# Patient Record
Sex: Female | Born: 1935 | Race: Black or African American | Hispanic: No | Smoking: Former smoker
Health system: Southern US, Community
[De-identification: ages and names within clinical notes are randomized; demographics above are authoritative.]

## PROBLEM LIST (undated history)

## (undated) DIAGNOSIS — I479 Paroxysmal tachycardia, unspecified: Secondary | ICD-10-CM

## (undated) DIAGNOSIS — I10 Essential (primary) hypertension: Secondary | ICD-10-CM

## (undated) DIAGNOSIS — E0590 Thyrotoxicosis, unspecified without thyrotoxic crisis or storm: Secondary | ICD-10-CM

## (undated) DIAGNOSIS — L84 Corns and callosities: Secondary | ICD-10-CM

## (undated) DIAGNOSIS — Z5111 Encounter for antineoplastic chemotherapy: Secondary | ICD-10-CM

## (undated) HISTORY — DX: Essential (primary) hypertension: I10

## (undated) HISTORY — DX: Thyrotoxicosis, unspecified without thyrotoxic crisis or storm: E05.90

## (undated) HISTORY — DX: Corns and callosities: L84

## (undated) HISTORY — PX: VAGINAL HYSTERECTOMY: SUR661

## (undated) HISTORY — DX: Encounter for antineoplastic chemotherapy: Z51.11

---

## 1987-11-12 HISTORY — PX: LESION REMOVAL: SHX5196

## 1997-08-29 ENCOUNTER — Ambulatory Visit (HOSPITAL_COMMUNITY): Admission: RE | Admit: 1997-08-29 | Discharge: 1997-08-29 | Payer: Self-pay | Admitting: Obstetrics & Gynecology

## 1998-09-02 ENCOUNTER — Ambulatory Visit (HOSPITAL_COMMUNITY): Admission: RE | Admit: 1998-09-02 | Discharge: 1998-09-02 | Payer: Self-pay | Admitting: Obstetrics & Gynecology

## 1998-09-24 ENCOUNTER — Other Ambulatory Visit: Admission: RE | Admit: 1998-09-24 | Discharge: 1998-09-24 | Payer: Self-pay | Admitting: Obstetrics & Gynecology

## 1999-09-04 ENCOUNTER — Ambulatory Visit (HOSPITAL_COMMUNITY): Admission: RE | Admit: 1999-09-04 | Discharge: 1999-09-04 | Payer: Self-pay | Admitting: Obstetrics & Gynecology

## 1999-09-04 ENCOUNTER — Encounter: Payer: Self-pay | Admitting: Obstetrics & Gynecology

## 1999-09-16 ENCOUNTER — Other Ambulatory Visit: Admission: RE | Admit: 1999-09-16 | Discharge: 1999-09-16 | Payer: Self-pay | Admitting: Obstetrics & Gynecology

## 2000-09-10 ENCOUNTER — Encounter: Payer: Self-pay | Admitting: Obstetrics & Gynecology

## 2000-09-10 ENCOUNTER — Ambulatory Visit (HOSPITAL_COMMUNITY): Admission: RE | Admit: 2000-09-10 | Discharge: 2000-09-10 | Payer: Self-pay | Admitting: Obstetrics & Gynecology

## 2000-10-08 ENCOUNTER — Other Ambulatory Visit: Admission: RE | Admit: 2000-10-08 | Discharge: 2000-10-08 | Payer: Self-pay | Admitting: Obstetrics & Gynecology

## 2001-09-12 ENCOUNTER — Ambulatory Visit (HOSPITAL_COMMUNITY): Admission: RE | Admit: 2001-09-12 | Discharge: 2001-09-12 | Payer: Self-pay | Admitting: Obstetrics & Gynecology

## 2001-09-12 ENCOUNTER — Encounter: Payer: Self-pay | Admitting: Obstetrics & Gynecology

## 2001-11-15 ENCOUNTER — Other Ambulatory Visit: Admission: RE | Admit: 2001-11-15 | Discharge: 2001-11-15 | Payer: Self-pay | Admitting: Obstetrics & Gynecology

## 2002-09-14 ENCOUNTER — Ambulatory Visit (HOSPITAL_COMMUNITY): Admission: RE | Admit: 2002-09-14 | Discharge: 2002-09-14 | Payer: Self-pay | Admitting: Obstetrics & Gynecology

## 2002-09-14 ENCOUNTER — Encounter: Payer: Self-pay | Admitting: Obstetrics & Gynecology

## 2003-09-20 ENCOUNTER — Ambulatory Visit (HOSPITAL_COMMUNITY): Admission: RE | Admit: 2003-09-20 | Discharge: 2003-09-20 | Payer: Self-pay | Admitting: Obstetrics & Gynecology

## 2003-12-17 ENCOUNTER — Other Ambulatory Visit: Admission: RE | Admit: 2003-12-17 | Discharge: 2003-12-17 | Payer: Self-pay | Admitting: Obstetrics & Gynecology

## 2003-12-20 ENCOUNTER — Encounter: Admission: RE | Admit: 2003-12-20 | Discharge: 2003-12-20 | Payer: Self-pay | Admitting: Obstetrics & Gynecology

## 2004-10-09 ENCOUNTER — Ambulatory Visit (HOSPITAL_COMMUNITY): Admission: RE | Admit: 2004-10-09 | Discharge: 2004-10-09 | Payer: Self-pay | Admitting: Obstetrics & Gynecology

## 2005-03-03 ENCOUNTER — Encounter: Admission: RE | Admit: 2005-03-03 | Discharge: 2005-03-03 | Payer: Self-pay | Admitting: Obstetrics & Gynecology

## 2006-01-28 ENCOUNTER — Encounter: Admission: RE | Admit: 2006-01-28 | Discharge: 2006-01-28 | Payer: Self-pay | Admitting: Family Medicine

## 2011-04-24 ENCOUNTER — Other Ambulatory Visit: Payer: Self-pay | Admitting: Internal Medicine

## 2011-04-24 DIAGNOSIS — R946 Abnormal results of thyroid function studies: Secondary | ICD-10-CM

## 2011-05-18 ENCOUNTER — Encounter (HOSPITAL_COMMUNITY)
Admission: RE | Admit: 2011-05-18 | Discharge: 2011-05-18 | Disposition: A | Discharge status: Home / Self Care | Payer: Medicare Other | Source: Ambulatory Visit | Attending: Internal Medicine | Admitting: Internal Medicine

## 2011-05-18 DIAGNOSIS — R946 Abnormal results of thyroid function studies: Secondary | ICD-10-CM

## 2011-05-18 DIAGNOSIS — E041 Nontoxic single thyroid nodule: Secondary | ICD-10-CM | POA: Insufficient documentation

## 2011-05-19 ENCOUNTER — Encounter (HOSPITAL_COMMUNITY)
Admission: RE | Admit: 2011-05-19 | Discharge: 2011-05-19 | Disposition: A | Discharge status: Home / Self Care | Payer: Medicare Other | Source: Ambulatory Visit | Attending: Internal Medicine | Admitting: Internal Medicine

## 2011-05-19 MED ORDER — SODIUM PERTECHNETATE TC 99M INJECTION
10.0000 | Freq: Once | INTRAVENOUS | Status: AC | PRN
  Administered 2011-05-19: 10 via INTRAVENOUS

## 2011-05-19 MED ORDER — SODIUM IODIDE I 131 CAPSULE
9.8000 | Freq: Once | INTRAVENOUS | Status: AC | PRN
  Administered 2011-05-18: 9.8 via ORAL

## 2012-09-01 ENCOUNTER — Encounter: Payer: Self-pay | Admitting: Podiatry

## 2012-09-01 ENCOUNTER — Encounter: Payer: Self-pay | Admitting: *Deleted

## 2012-09-06 ENCOUNTER — Ambulatory Visit (INDEPENDENT_AMBULATORY_CARE_PROVIDER_SITE_OTHER): Payer: Medicare Other | Admitting: Podiatry

## 2012-09-06 ENCOUNTER — Encounter: Payer: Self-pay | Admitting: Podiatry

## 2012-09-06 VITALS — BP 172/105 | HR 109 | Ht 64.0 in | Wt 135.0 lb

## 2012-09-06 DIAGNOSIS — M25579 Pain in unspecified ankle and joints of unspecified foot: Secondary | ICD-10-CM

## 2012-09-06 DIAGNOSIS — Q828 Other specified congenital malformations of skin: Secondary | ICD-10-CM

## 2012-09-06 DIAGNOSIS — B351 Tinea unguium: Secondary | ICD-10-CM

## 2012-09-06 NOTE — Patient Instructions (Addendum)
Seen for painful corns and calluses. All nails, corns, and calluses trimmed. Return in one month or as needed.

## 2012-09-07 NOTE — Progress Notes (Signed)
Subjective: 78 y.o. year old female patient presents complaining of painful corns, calluses and toe nails. Patient requests toe nails, corns and calluses trimmed.   Review of Systems - General ROS: negative for - chills, fatigue, fever, hot flashes, malaise, night sweats, sleep disturbance, weight gain or weight loss.  Objective: Dermatologic: Thick yellow deformed nails 1-5 bilateral.  Positive of painful calluses under the ball of both feet. No open skin lesions.   Vascular: Pedal pulses are all palpable.  Orthopedic: Contracted lesser digits with digital corns 4th bilateral.  Neurologic: All epicritic and tactile sensations grossly intact.  Assessment: 1. Mycotic nails x 10. 2. Intractable porokeratosis plantar bilateral. 3. Hammer toe deformity bilateral.  Treatment: All mycotic nails, corns, calluses debrided.  Padded 4th digit left foot. Return as needed.

## 2012-10-04 ENCOUNTER — Ambulatory Visit (INDEPENDENT_AMBULATORY_CARE_PROVIDER_SITE_OTHER): Payer: Medicare Other | Admitting: Podiatry

## 2012-10-04 ENCOUNTER — Encounter: Payer: Self-pay | Admitting: Podiatry

## 2012-10-04 VITALS — BP 142/89 | HR 103 | Ht 64.0 in | Wt 135.0 lb

## 2012-10-04 DIAGNOSIS — L84 Corns and callosities: Secondary | ICD-10-CM

## 2012-10-04 NOTE — Progress Notes (Signed)
S:  Patient presents requesting callus be trimmed. She has a class re-union event to go. Wants calluses trimmed lightly. O:   All pedal pulses palpable. Multiple spider veins and discolored skin. Severe porokeratosis plantar bilateral and 2nd toe right. A: Painful calluses. P:  Debrided all calluses.

## 2012-11-01 ENCOUNTER — Ambulatory Visit (INDEPENDENT_AMBULATORY_CARE_PROVIDER_SITE_OTHER): Payer: Medicare Other | Admitting: Podiatry

## 2012-11-01 VITALS — BP 130/93 | HR 104

## 2012-11-01 DIAGNOSIS — L84 Corns and callosities: Secondary | ICD-10-CM

## 2012-11-01 DIAGNOSIS — Q828 Other specified congenital malformations of skin: Secondary | ICD-10-CM

## 2012-11-01 NOTE — Progress Notes (Signed)
Subjective: Patient presents requesting calluses trimmed. No other problems. Objective:  Keratotic lesion under first and 4th MPJ bilateral.  Assessment:  Painful porokeratosis plantar sub 1 and 4 bilateral. Plan:  Palliation. All lesions debrided.

## 2012-11-29 ENCOUNTER — Ambulatory Visit (INDEPENDENT_AMBULATORY_CARE_PROVIDER_SITE_OTHER): Payer: Medicare Other | Admitting: Podiatry

## 2012-11-29 ENCOUNTER — Encounter: Payer: Self-pay | Admitting: Podiatry

## 2012-11-29 VITALS — BP 160/90 | HR 104

## 2012-11-29 DIAGNOSIS — L84 Corns and callosities: Secondary | ICD-10-CM

## 2012-11-29 NOTE — Progress Notes (Signed)
Assessment: Patient presents requesting toe nails and calluses trimmed. Pain on bottom of both feet with weight bearing.   Objective: Porokeratosis under 4th MPJ area right foot.  Hypertrophic nails x 10. Pedal pulses are not palpable  Hallux valgus bilateral. Contracted digits 4th bilateral. Varicose vein both ankles.  Assessment: Porokeratosis bilateral. Mycotic nails x 10.  Plan: Debrided all nails and calluses.

## 2012-12-30 ENCOUNTER — Ambulatory Visit (INDEPENDENT_AMBULATORY_CARE_PROVIDER_SITE_OTHER): Payer: Medicare Other | Admitting: Podiatry

## 2012-12-30 VITALS — BP 142/83 | HR 104

## 2012-12-30 DIAGNOSIS — M25579 Pain in unspecified ankle and joints of unspecified foot: Secondary | ICD-10-CM | POA: Insufficient documentation

## 2012-12-30 DIAGNOSIS — L84 Corns and callosities: Secondary | ICD-10-CM

## 2012-12-30 NOTE — Progress Notes (Signed)
Subjective: 78 y.o. year old female patient presents complaining of painful nails. Patient requests toe nails, corns and calluses trimmed.   Review of Systems  Get hot flashes in occasion otherwise no other problems. General ROS: negative for - chills, fatigue, fever, sleep disturbance, weight gain or weight loss Ophthalmic ROS: negative ENT ROS: negative Endocrine ROS: Thyroid gland problem and being treated.  Respiratory ROS: no cough, shortness of breath, or wheezing Cardiovascular ROS: Occasional fast heart beat, but doing fine. Gastrointestinal ROS: no abdominal pain, change in bowel habits, or black or bloody stools Musculoskeletal ROS: negative Neurological ROS: no TIA or stroke symptoms Dermatological ROS: None other than calluses and corns on feet.   Objective:  Keratotic lesion under first and 4th MPJ bilateral.   Severe painful porokeratosis under 1st and 4th MPJ right, under 5th left. Assessment:  Painful porokeratosis plantar sub 1 and 4 bilateral. Plan:  Palliation. All lesions debrided.  Return in 3 months or as needed.

## 2013-01-31 ENCOUNTER — Ambulatory Visit (INDEPENDENT_AMBULATORY_CARE_PROVIDER_SITE_OTHER): Payer: Medicare Other | Admitting: Podiatry

## 2013-01-31 DIAGNOSIS — L84 Corns and callosities: Secondary | ICD-10-CM

## 2013-01-31 DIAGNOSIS — M25579 Pain in unspecified ankle and joints of unspecified foot: Secondary | ICD-10-CM

## 2013-02-03 NOTE — Progress Notes (Signed)
Subjective:  78 y.o. year old female patient presents complaining of painful nails. Patient requests toe nails, corns and calluses trimmed.  Patient reports no new problems.   Objective:  Keratotic lesion under first and 4th MPJ bilateral.  Severe painful porokeratosis under 1st and 4th MPJ right, under 5th left.  Assessment:  Painful porokeratosis plantar sub 1 and 4 bilateral.   Plan:  Palliation.  All painful lesions debrided.  Return in 3 months or as needed.

## 2013-02-28 ENCOUNTER — Encounter: Payer: Self-pay | Admitting: Podiatry

## 2013-02-28 ENCOUNTER — Ambulatory Visit (INDEPENDENT_AMBULATORY_CARE_PROVIDER_SITE_OTHER): Payer: Medicare Other | Admitting: Podiatry

## 2013-02-28 VITALS — BP 143/94 | HR 112

## 2013-02-28 DIAGNOSIS — M25579 Pain in unspecified ankle and joints of unspecified foot: Secondary | ICD-10-CM

## 2013-02-28 DIAGNOSIS — L84 Corns and callosities: Secondary | ICD-10-CM

## 2013-02-28 NOTE — Patient Instructions (Addendum)
Seen for painful calluses. Debrided all calluses on both feet. No new problems noted. Return as needed.

## 2013-02-28 NOTE — Progress Notes (Signed)
Subjective:  78 y.o. year old female patient presents complaining of painful callus. Patient requests calluses trimmed.  Patient reports no new problems.   Objective:  Keratotic lesion under first and 4th MPJ bilateral.  Severe painful porokeratosis under 1st and 4th MPJ right, under 5th left.   Assessment:  Painful porokeratosis plantar sub 1 and 4 bilateral.   Plan:  Palliation.  All painful lesions debrided.  Return in 3 months or as needed.

## 2013-03-28 ENCOUNTER — Ambulatory Visit (INDEPENDENT_AMBULATORY_CARE_PROVIDER_SITE_OTHER): Payer: Medicare Other | Admitting: Podiatry

## 2013-03-28 ENCOUNTER — Encounter: Payer: Self-pay | Admitting: Podiatry

## 2013-03-28 VITALS — Ht 64.0 in | Wt 135.0 lb

## 2013-03-28 DIAGNOSIS — M25579 Pain in unspecified ankle and joints of unspecified foot: Secondary | ICD-10-CM

## 2013-03-28 DIAGNOSIS — L84 Corns and callosities: Secondary | ICD-10-CM

## 2013-03-28 NOTE — Patient Instructions (Signed)
Seen for painful corns and calluses. Debrided all. Return as needed.

## 2013-03-28 NOTE — Progress Notes (Signed)
Seen for painful callus on both feet. No new problems. All corns, calluses and nails debrided.

## 2013-04-25 ENCOUNTER — Encounter: Payer: Self-pay | Admitting: Podiatry

## 2013-04-25 ENCOUNTER — Ambulatory Visit (INDEPENDENT_AMBULATORY_CARE_PROVIDER_SITE_OTHER): Payer: Medicare Other | Admitting: Podiatry

## 2013-04-25 VITALS — BP 143/90 | HR 103 | Ht 64.0 in | Wt 135.0 lb

## 2013-04-25 DIAGNOSIS — L84 Corns and callosities: Secondary | ICD-10-CM

## 2013-04-25 DIAGNOSIS — M25579 Pain in unspecified ankle and joints of unspecified foot: Secondary | ICD-10-CM

## 2013-04-25 NOTE — Progress Notes (Signed)
Seen for hypertrophic calluses.  ObjectiveL Plantar calluses bilateral painful.  Assessment: Plantar porokeratosis painful bilateral.  Treatment: All calluses debrided. Return in 3 months or as needed.

## 2013-04-25 NOTE — Patient Instructions (Signed)
Seen for hypertrophic calluses. All calluses debrided. Return in 3 months or as needed.  

## 2013-05-23 ENCOUNTER — Encounter: Payer: Self-pay | Admitting: Podiatry

## 2013-05-23 ENCOUNTER — Ambulatory Visit (INDEPENDENT_AMBULATORY_CARE_PROVIDER_SITE_OTHER): Payer: Medicare Other | Admitting: Podiatry

## 2013-05-23 VITALS — BP 141/89 | HR 114 | Ht 64.0 in | Wt 135.0 lb

## 2013-05-23 DIAGNOSIS — M25579 Pain in unspecified ankle and joints of unspecified foot: Secondary | ICD-10-CM

## 2013-05-23 DIAGNOSIS — L84 Corns and callosities: Secondary | ICD-10-CM

## 2013-05-23 NOTE — Patient Instructions (Signed)
Seen for painful corns and calluses. All lesions debrided.  Return as needed.

## 2013-05-23 NOTE — Progress Notes (Signed)
Subjective:  78 year old female presents complaining of painful calluses.   Objective: Plantar calluses bilateral painful.  Corns on 4th digit bilateral.   Assessment:  Plantar porokeratosis painful bilateral.   Treatment:  All calluses debrided.  Return in 3 months or as needed.

## 2013-06-20 ENCOUNTER — Ambulatory Visit (INDEPENDENT_AMBULATORY_CARE_PROVIDER_SITE_OTHER): Payer: Medicare Other | Admitting: Podiatry

## 2013-06-20 ENCOUNTER — Encounter: Payer: Self-pay | Admitting: Podiatry

## 2013-06-20 VITALS — BP 146/83 | HR 110

## 2013-06-20 DIAGNOSIS — M25579 Pain in unspecified ankle and joints of unspecified foot: Secondary | ICD-10-CM

## 2013-06-20 DIAGNOSIS — L84 Corns and callosities: Secondary | ICD-10-CM

## 2013-06-20 NOTE — Patient Instructions (Signed)
Debrided corns, calluses and toe nails. Return as needed.

## 2013-06-20 NOTE — Progress Notes (Signed)
Subjective:  79 year old female presents complaining of painful calluses.  4th digit left foot is very sore to touch. Last corn pad lasted a week.   Objective: Plantar calluses bilateral painful.  Corns on 4th digit bilateral.  Hypertrophic nails x 10.  Assessment:  Digital corn 4th bilateral, plantar porokeratosis sub 1 and 5 painful bilateral.  Hypertrophic nails x 10.  Treatment:  All calluses debrided.  Return in 3 months or as needed.

## 2013-07-18 ENCOUNTER — Encounter: Payer: Self-pay | Admitting: Podiatry

## 2013-07-18 ENCOUNTER — Ambulatory Visit (INDEPENDENT_AMBULATORY_CARE_PROVIDER_SITE_OTHER): Payer: Medicare Other | Admitting: Podiatry

## 2013-07-18 VITALS — BP 151/83 | HR 116

## 2013-07-18 DIAGNOSIS — L84 Corns and callosities: Secondary | ICD-10-CM

## 2013-07-18 DIAGNOSIS — M25579 Pain in unspecified ankle and joints of unspecified foot: Secondary | ICD-10-CM

## 2013-07-18 NOTE — Patient Instructions (Signed)
Debrided all corns and calluses. Return as needed.

## 2013-07-18 NOTE — Progress Notes (Signed)
Seen for painful calluses. Painful corn on 4th left. Painful calluses under 5th MPJ bilateral. Multiple contracted digits. All debrided.

## 2013-08-15 ENCOUNTER — Ambulatory Visit (INDEPENDENT_AMBULATORY_CARE_PROVIDER_SITE_OTHER): Payer: Medicare Other | Admitting: Podiatry

## 2013-08-15 ENCOUNTER — Encounter: Payer: Self-pay | Admitting: Podiatry

## 2013-08-15 VITALS — BP 141/84 | HR 105 | Ht 64.0 in | Wt 129.0 lb

## 2013-08-15 DIAGNOSIS — L84 Corns and callosities: Secondary | ICD-10-CM

## 2013-08-15 DIAGNOSIS — M25579 Pain in unspecified ankle and joints of unspecified foot: Secondary | ICD-10-CM

## 2013-08-15 NOTE — Patient Instructions (Signed)
Seen for painful corns and calluses.  All nails, corns and calluses debrided. Padded 4th digit left. Return as needed.

## 2013-08-15 NOTE — Progress Notes (Signed)
Subjective: 79 year old female patient presents complaining of painful corns and calluses both feet.   Objective: Painful corn on 4th left.  Painful calluses under 5th MPJ bilateral.  Multiple contracted digits with severe hammer toe 4th left. Neurovascular status are within normal. No open lesions.  Assessment: Painful corns and calluses bilateral. Thick dystrophic nails x 10.   Plan: All lesions and nails debrided.  4th digit left applied aperture pad.  Return as needed.

## 2013-09-12 ENCOUNTER — Encounter: Payer: Self-pay | Admitting: Podiatry

## 2013-09-12 ENCOUNTER — Ambulatory Visit (INDEPENDENT_AMBULATORY_CARE_PROVIDER_SITE_OTHER): Payer: Medicare Other | Admitting: Podiatry

## 2013-09-12 VITALS — BP 147/81 | HR 112

## 2013-09-12 DIAGNOSIS — M25579 Pain in unspecified ankle and joints of unspecified foot: Secondary | ICD-10-CM

## 2013-09-12 DIAGNOSIS — L84 Corns and callosities: Secondary | ICD-10-CM

## 2013-09-12 NOTE — Progress Notes (Signed)
Subjective:  79 year old female patient presents complaining of painful corns and calluses both feet.   Objective:  Painful corn on 4th left.  Painful calluses under 5th MPJ bilateral.  Multiple contracted digits with severe hammer toe 4th left.  Neurovascular status are within normal.   Assessment:  Painful corns and calluses bilateral.  Thick dystrophic nails x 10.   Plan:  All lesions and nails debrided.  Return as needed.

## 2013-09-12 NOTE — Patient Instructions (Signed)
Seen for hypertrophic nails and calluses. All nails and calluses debrided. Return in 3 months or as needed.  

## 2013-10-10 ENCOUNTER — Ambulatory Visit (INDEPENDENT_AMBULATORY_CARE_PROVIDER_SITE_OTHER): Payer: Medicare Other | Admitting: Podiatry

## 2013-10-10 ENCOUNTER — Encounter: Payer: Self-pay | Admitting: Podiatry

## 2013-10-10 VITALS — BP 130/82 | HR 102

## 2013-10-10 DIAGNOSIS — M25579 Pain in unspecified ankle and joints of unspecified foot: Secondary | ICD-10-CM

## 2013-10-10 DIAGNOSIS — L84 Corns and callosities: Secondary | ICD-10-CM

## 2013-10-10 NOTE — Patient Instructions (Signed)
Debrided all corns and calluses. Return as needed.

## 2013-10-10 NOTE — Progress Notes (Signed)
Subjective:  79 year old female patient presents complaining of painful corns and calluses both feet.   Objective:  Painful corn on 4th left.  Painful calluses under 5th MPJ bilateral.  Multiple contracted digits with severe hammer toe 4th left.  Neurovascular status are within normal.   Assessment:  Painful corns and calluses bilateral.  Thick dystrophic nails x 10.   Plan:  All callused lesions debrided.  Return as needed.

## 2013-11-07 ENCOUNTER — Encounter: Payer: Self-pay | Admitting: Podiatry

## 2013-11-07 ENCOUNTER — Ambulatory Visit (INDEPENDENT_AMBULATORY_CARE_PROVIDER_SITE_OTHER): Payer: Medicare Other | Admitting: Podiatry

## 2013-11-07 VITALS — BP 140/82 | HR 103

## 2013-11-07 DIAGNOSIS — M25579 Pain in unspecified ankle and joints of unspecified foot: Secondary | ICD-10-CM

## 2013-11-07 DIAGNOSIS — L84 Corns and callosities: Secondary | ICD-10-CM

## 2013-11-07 NOTE — Progress Notes (Signed)
Subjective:  79 year old female patient presents complaining of painful corns and calluses both feet.   Objective:  Painful ingrown nail 2nd right.  Painful corn on 4th left.  Painful calluses under 5th MPJ bilateral.  Multiple contracted digits with bunions bilateral, and severe hammer toe 4th left.  Neurovascular status are within normal.   Assessment:  Painful corns and calluses bilateral.  Thick dystrophic nails x 10.   Plan:  All callused lesions debrided.  Return as needed

## 2013-11-07 NOTE — Patient Instructions (Signed)
Seen for hypertrophic nails, corns, and calluses. All nails, corns, and calluses debrided. Return in 3 months or as needed.

## 2013-12-05 ENCOUNTER — Encounter: Payer: Self-pay | Admitting: Podiatry

## 2013-12-05 ENCOUNTER — Ambulatory Visit (INDEPENDENT_AMBULATORY_CARE_PROVIDER_SITE_OTHER): Payer: Medicare Other | Admitting: Podiatry

## 2013-12-05 DIAGNOSIS — M25579 Pain in unspecified ankle and joints of unspecified foot: Secondary | ICD-10-CM

## 2013-12-05 DIAGNOSIS — L84 Corns and callosities: Secondary | ICD-10-CM

## 2013-12-05 NOTE — Progress Notes (Signed)
Subjective:  79 year old female patient presents requesting calluses trimmed.   Objective:  Painful corn on 4th left.  Painful calluses under 5th MPJ bilateral.  Multiple contracted digits with severe hammer toe 4th left.  Neurovascular status are within normal.   Assessment:  Painful corns and calluses bilateral.   Plan:  All callused lesions debrided.  Return as needed.

## 2013-12-05 NOTE — Patient Instructions (Signed)
Seen for hypertrophic calluses. All calluses debrided. Return as needed.

## 2014-01-02 ENCOUNTER — Ambulatory Visit (INDEPENDENT_AMBULATORY_CARE_PROVIDER_SITE_OTHER): Payer: Medicare Other | Admitting: Podiatry

## 2014-01-02 ENCOUNTER — Encounter: Payer: Self-pay | Admitting: Podiatry

## 2014-01-02 VITALS — BP 143/83 | HR 115

## 2014-01-02 DIAGNOSIS — L84 Corns and callosities: Secondary | ICD-10-CM

## 2014-01-02 NOTE — Patient Instructions (Signed)
Seen for hypertrophic nails and calluses. All nails and calluses debrided. Return as needed.

## 2014-01-02 NOTE — Progress Notes (Signed)
Subjective:  79 year old female patient presents requesting calluses trimmed. No new problems.   Objective:  Painful corn on 4th left.  Painful calluses under 5th MPJ bilateral.  Multiple contracted digits with severe hammer toe 4th left.  Neurovascular status are within normal.   Assessment:  Painful corns and calluses bilateral.   Plan:  All callused lesions debrided.  Return as needed.

## 2014-01-30 ENCOUNTER — Encounter: Payer: Self-pay | Admitting: Podiatry

## 2014-01-30 ENCOUNTER — Ambulatory Visit (INDEPENDENT_AMBULATORY_CARE_PROVIDER_SITE_OTHER): Payer: Medicare Other | Admitting: Podiatry

## 2014-01-30 VITALS — BP 153/83 | HR 84 | Ht 64.0 in | Wt 119.0 lb

## 2014-01-30 DIAGNOSIS — L84 Corns and callosities: Secondary | ICD-10-CM

## 2014-01-30 NOTE — Patient Instructions (Signed)
Seen for hypertrophic nails and calluses. All nails and calluses debrided. Return as needed.

## 2014-01-30 NOTE — Progress Notes (Signed)
Subjective:  79 year old female patient presents requesting calluses trimmed. No new problems.   Objective:  Painful corn on 4th left.  Painful calluses under 5th MPJ bilateral.  Multiple contracted digits with severe hammer toe 4th left.  Neurovascular status are within normal.   Assessment:  Painful corns and calluses bilateral.   Plan:  All callused lesions debrided.  Return as needed.

## 2014-02-27 ENCOUNTER — Ambulatory Visit (INDEPENDENT_AMBULATORY_CARE_PROVIDER_SITE_OTHER): Payer: Medicare Other | Admitting: Podiatry

## 2014-02-27 ENCOUNTER — Encounter: Payer: Self-pay | Admitting: Podiatry

## 2014-02-27 VITALS — BP 154/90 | HR 94 | Ht 64.0 in | Wt 120.0 lb

## 2014-02-27 DIAGNOSIS — M25579 Pain in unspecified ankle and joints of unspecified foot: Secondary | ICD-10-CM

## 2014-02-27 DIAGNOSIS — L84 Corns and callosities: Secondary | ICD-10-CM

## 2014-02-27 NOTE — Patient Instructions (Signed)
Seen for hypertrophic calluses. All calluses debrided. Return as needed.

## 2014-02-27 NOTE — Progress Notes (Signed)
Subjective:  79 year old female patient presents requesting calluses trimmed. No new problems.   Objective:  Painful corn on 4th left.  Painful calluses under 5th MPJ bilateral.  Multiple contracted digits with severe hammer toe 4th left.  Neurovascular status are within normal.   Assessment:  Painful corns and calluses bilateral.   Plan:  All callused lesions debrided.  Return as needed.

## 2014-03-27 ENCOUNTER — Ambulatory Visit (INDEPENDENT_AMBULATORY_CARE_PROVIDER_SITE_OTHER): Payer: Medicare Other | Admitting: Podiatry

## 2014-03-27 ENCOUNTER — Encounter: Payer: Self-pay | Admitting: Podiatry

## 2014-03-27 VITALS — BP 134/93 | HR 91

## 2014-03-27 DIAGNOSIS — L84 Corns and callosities: Secondary | ICD-10-CM

## 2014-03-27 NOTE — Progress Notes (Signed)
Subjective:  79 year old female patient presents requesting calluses trimmed. No new problems.   Objective:  Painful corn on 4th left.  Painful calluses under 5th MPJ bilateral.  Multiple contracted digits with severe hammer toe 4th left.  Neurovascular status are within normal.   Assessment:  Painful corns and calluses bilateral.   Plan:  All callused lesions debrided.  Corn pad on 4th left applied. Return as needed.

## 2014-03-27 NOTE — Patient Instructions (Signed)
Seen for painful calluses and corns.  All corns and calluses debrided. Dispensed toe pad x 2. Return as needed.

## 2014-04-24 ENCOUNTER — Ambulatory Visit (INDEPENDENT_AMBULATORY_CARE_PROVIDER_SITE_OTHER): Payer: Medicare Other | Admitting: Podiatry

## 2014-04-24 ENCOUNTER — Encounter: Payer: Self-pay | Admitting: Podiatry

## 2014-04-24 VITALS — BP 162/89 | HR 94

## 2014-04-24 DIAGNOSIS — L84 Corns and callosities: Secondary | ICD-10-CM

## 2014-04-24 DIAGNOSIS — M25579 Pain in unspecified ankle and joints of unspecified foot: Secondary | ICD-10-CM

## 2014-04-24 NOTE — Patient Instructions (Signed)
Seen for hypertrophic nails, corns, and calluses. All nails debrided. Return as needed.

## 2014-04-24 NOTE — Progress Notes (Signed)
Subjective:  79 year old female patient presents requesting calluses trimmed. No new problems.   Objective:  Painful calluses under 5th MPJ bilateral.  Multiple contracted digits with severe hammer toe 4th left.  Neurovascular status are within normal.   Assessment:  Painful corns and calluses bilateral.  Hypertrophic nails x 10.  Plan:  All callused lesions debrided.  Return as needed.

## 2014-05-22 ENCOUNTER — Ambulatory Visit: Payer: Medicare Other | Admitting: Podiatry

## 2014-10-08 ENCOUNTER — Other Ambulatory Visit: Payer: Self-pay | Admitting: Internal Medicine

## 2014-10-08 DIAGNOSIS — R109 Unspecified abdominal pain: Secondary | ICD-10-CM

## 2014-10-08 DIAGNOSIS — R634 Abnormal weight loss: Secondary | ICD-10-CM

## 2014-10-08 DIAGNOSIS — R0602 Shortness of breath: Secondary | ICD-10-CM

## 2014-10-11 ENCOUNTER — Encounter (HOSPITAL_COMMUNITY): Payer: Self-pay

## 2014-10-11 ENCOUNTER — Ambulatory Visit (HOSPITAL_COMMUNITY)
Admission: RE | Admit: 2014-10-11 | Discharge: 2014-10-11 | Disposition: A | Discharge status: Home / Self Care | Payer: Medicare Other | Source: Ambulatory Visit | Attending: Internal Medicine | Admitting: Internal Medicine

## 2014-10-11 DIAGNOSIS — Z87891 Personal history of nicotine dependence: Secondary | ICD-10-CM | POA: Diagnosis not present

## 2014-10-11 DIAGNOSIS — R109 Unspecified abdominal pain: Secondary | ICD-10-CM

## 2014-10-11 DIAGNOSIS — I10 Essential (primary) hypertension: Secondary | ICD-10-CM | POA: Diagnosis not present

## 2014-10-11 DIAGNOSIS — R0602 Shortness of breath: Secondary | ICD-10-CM | POA: Insufficient documentation

## 2014-10-11 DIAGNOSIS — R634 Abnormal weight loss: Secondary | ICD-10-CM

## 2014-10-11 LAB — POCT I-STAT CREATININE: Creatinine, Ser: 1 mg/dL (ref 0.44–1.00)

## 2014-10-11 MED ORDER — IOHEXOL 300 MG/ML  SOLN
100.0000 mL | Freq: Once | INTRAMUSCULAR | Status: AC | PRN
  Administered 2014-10-11: 80 mL via INTRAVENOUS

## 2014-10-12 ENCOUNTER — Encounter: Payer: Self-pay | Admitting: Internal Medicine

## 2014-10-12 ENCOUNTER — Ambulatory Visit (INDEPENDENT_AMBULATORY_CARE_PROVIDER_SITE_OTHER): Payer: Medicare Other | Admitting: Internal Medicine

## 2014-10-12 VITALS — BP 138/76 | HR 106 | Ht 64.0 in | Wt 109.0 lb

## 2014-10-12 DIAGNOSIS — R918 Other nonspecific abnormal finding of lung field: Secondary | ICD-10-CM | POA: Diagnosis not present

## 2014-10-12 DIAGNOSIS — R0600 Dyspnea, unspecified: Secondary | ICD-10-CM

## 2014-10-12 DIAGNOSIS — F17210 Nicotine dependence, cigarettes, uncomplicated: Secondary | ICD-10-CM | POA: Diagnosis not present

## 2014-10-12 DIAGNOSIS — R0602 Shortness of breath: Secondary | ICD-10-CM | POA: Insufficient documentation

## 2014-10-12 DIAGNOSIS — Z720 Tobacco use: Secondary | ICD-10-CM

## 2014-10-12 DIAGNOSIS — J449 Chronic obstructive pulmonary disease, unspecified: Secondary | ICD-10-CM

## 2014-10-12 NOTE — Patient Instructions (Addendum)
The key is to stop smoking completely before smoking completely stops you!   Come to outpatient registration at [facility] (behind the ER) at 715 am Thursday  10/18/14  with nothing to eat or drink after midnight wednesday.for Bronchoscopy - no aspirin after Monday

## 2014-10-12 NOTE — Progress Notes (Signed)
   Subjective:    Patient ID: Susan Garrett, female    DOB: [birth year],    MRN: [id number]  HPI  40 yobf active smoker with FTT x 2015 and new RUL mass on eval of 40 lb wt loss seen at request of Dr Carlis Abbott for lung mass.    10/12/2014 1st [place] [facility] office visit/ Susan Garrett   Chief Complaint  Patient presents with  . Advice Only    referred by Dr. Carlis Abbott for lung mass.CT in epic.     Indolent onset x 1 year progressively worse doe/ wt loss/ can't walk 50 ft with giving out "just tired"  No cough/ hemoptysis/ pleuritic cp  No obvious other patterns in day to day or daytime variabilty or assoc chronic cough or cp or chest tightness, subjective wheeze overt sinus or hb symptoms. No unusual exp hx or h/o childhood pna/ asthma or knowledge of premature birth.  Sleeping ok without nocturnal  or early am exacerbation  of respiratory  c/o's or need for noct saba. Also denies any obvious fluctuation of symptoms with weather or environmental changes or other aggravating or alleviating factors except as outlined above   Current Medications, Allergies, Complete Past Medical History, Past Surgical History, Family History, and Social History were reviewed in [employer] record.            Review of Systems  Constitutional: Negative for fever and unexpected weight change.  HENT: Negative for congestion, dental problem, ear pain, nosebleeds, postnasal drip, rhinorrhea, sinus pressure, sneezing, sore throat and trouble swallowing.   Eyes: Negative for redness and itching.  Respiratory: Positive for chest tightness and shortness of breath. Negative for cough and wheezing.   Cardiovascular: Negative for palpitations and leg swelling.  Gastrointestinal: Negative for nausea and vomiting.  Genitourinary: Negative for dysuria.  Musculoskeletal: Negative for joint swelling.  Skin: Negative for rash.  Neurological: Negative for headaches.  Hematological: Does not bruise/bleed  easily.  Psychiatric/Behavioral: Negative for dysphoric mood. The patient is not nervous/anxious.        Objective:   Physical Exam   amb bf nad/ quite thin and anxious, not clear she's understanding potential  impact of her problem   Wt Readings from Last 3 Encounters:  10/12/14 109 lb (49.442 kg)  02/27/14 120 lb (54.432 kg)  01/30/14 119 lb (53.978 kg)    Vital signs reviewed   HEENT: nl dentition, turbinates, and orophanx. Nl external ear canals without cough reflex   NECK :  without JVD/Nodes/TM/ nl carotid upstrokes bilaterally   LUNGS: no acc muscle use, clear to A and P bilaterally without cough on insp or exp maneuvers   CV:  RRR  no s3 or murmur or increase in P2, no edema   ABD:  soft and nontender with nl excursion in the supine position. No bruits or organomegaly, bowel sounds nl  MS:  warm without deformities, calf tenderness, cyanosis or clubbing  SKIN: warm and dry without lesions    NEURO:  alert, approp, no deficits     I personally reviewed images and agree with radiology impression as follows:  CT with contrast   10/08/14  Macro lobulated 5.8 x 4.4 x 3.6 cm diameter RIGHT upper lobe mass with scattered spiculated margins consistent with a primary pulmonary malignancy.  No thoracic adenopathy.       Assessment & Plan:

## 2014-10-13 DIAGNOSIS — F17210 Nicotine dependence, cigarettes, uncomplicated: Secondary | ICD-10-CM | POA: Insufficient documentation

## 2014-10-13 DIAGNOSIS — J438 Other emphysema: Secondary | ICD-10-CM | POA: Insufficient documentation

## 2014-10-13 DIAGNOSIS — R918 Other nonspecific abnormal finding of lung field: Secondary | ICD-10-CM | POA: Insufficient documentation

## 2014-10-13 NOTE — Assessment & Plan Note (Signed)
I had an extended discussion with the patient reviewing all relevant studies completed to date and  lasting 37mnutes  on the following ongoing concerns:    1) this is almost certainly a tumor and is not operable per pfts and likely not resectable for cure either though would require PET for staging  2) best option is get tissue dx from fob and go from there (reviewed 3 paths:  Prayer, chemo, rt, with the former not necessarily mutually exclusive from the latter 2 options)    3) Discussed in detail all the  indications, usual  risks and alternatives  relative to the benefits with patient who wants to think some more about it   4) tentatively scheduled for 10/18/14 if she agrees

## 2014-10-13 NOTE — Assessment & Plan Note (Signed)
Spirometry 10/12/14  fev1  0.5 (27%) ratio 39   This makes her inoperable and critical that she stop smoking now (see sep a/p)

## 2014-10-13 NOTE — Assessment & Plan Note (Signed)

## 2014-10-16 ENCOUNTER — Telehealth: Payer: Self-pay | Admitting: Internal Medicine

## 2014-10-16 NOTE — Telephone Encounter (Signed)
Pt calling to go over all instructions for pre-op with our office again.  Pt aware to arrive about 6am to [facility] registration for check-in for procedure.  Pt aware that she needs to have a ride with her and they have to stay for the entire time and take her home after.  Per MW, patient can leave dentures in during procedure.  Patient Instructions     The key is to stop smoking completely before smoking completely stops you!   Come to outpatient registration at [facility] (behind the ER) at 715 am Thursday 10/18/14 with nothing to eat or drink after midnight wednesday.for Bronchoscopy - no aspirin after Monday   Nothing further needed.

## 2014-10-17 ENCOUNTER — Telehealth: Payer: Self-pay | Admitting: Internal Medicine

## 2014-10-17 NOTE — Telephone Encounter (Signed)
Spoke with pt, relayed recs for bronch.  Pt understands.  Nothing further needed.

## 2014-10-18 ENCOUNTER — Ambulatory Visit (HOSPITAL_COMMUNITY): Payer: Medicare Other

## 2014-10-18 ENCOUNTER — Encounter (HOSPITAL_COMMUNITY): Payer: Self-pay | Admitting: Respiratory Therapy

## 2014-10-18 ENCOUNTER — Ambulatory Visit (HOSPITAL_COMMUNITY)
Admission: RE | Admit: 2014-10-18 | Discharge: 2014-10-18 | Disposition: A | Discharge status: Home / Self Care | Payer: Medicare Other | Source: Ambulatory Visit | Attending: Internal Medicine | Admitting: Internal Medicine

## 2014-10-18 ENCOUNTER — Encounter (HOSPITAL_COMMUNITY)
Admission: RE | Disposition: A | Discharge status: Home / Self Care | Payer: Medicare Other | Source: Ambulatory Visit | Attending: Internal Medicine

## 2014-10-18 DIAGNOSIS — C3411 Malignant neoplasm of upper lobe, right bronchus or lung: Secondary | ICD-10-CM | POA: Diagnosis not present

## 2014-10-18 DIAGNOSIS — R918 Other nonspecific abnormal finding of lung field: Secondary | ICD-10-CM | POA: Diagnosis present

## 2014-10-18 DIAGNOSIS — J449 Chronic obstructive pulmonary disease, unspecified: Secondary | ICD-10-CM | POA: Diagnosis not present

## 2014-10-18 DIAGNOSIS — Z9889 Other specified postprocedural states: Secondary | ICD-10-CM

## 2014-10-18 DIAGNOSIS — R634 Abnormal weight loss: Secondary | ICD-10-CM | POA: Diagnosis not present

## 2014-10-18 DIAGNOSIS — F17210 Nicotine dependence, cigarettes, uncomplicated: Secondary | ICD-10-CM | POA: Diagnosis not present

## 2014-10-18 HISTORY — PX: VIDEO BRONCHOSCOPY: SHX5072

## 2014-10-18 SURGERY — BRONCHOSCOPY, WITH FLUOROSCOPY
Anesthesia: Moderate Sedation | Laterality: Bilateral

## 2014-10-18 MED ORDER — MEPERIDINE HCL 25 MG/ML IJ SOLN
INTRAMUSCULAR | Status: DC | PRN
  Administered 2014-10-18: 50 mg via INTRAVENOUS

## 2014-10-18 MED ORDER — PHENYLEPHRINE HCL 0.25 % NA SOLN: 1.0000 | Freq: Four times a day (QID) | NASAL | Status: DC | PRN

## 2014-10-18 MED ORDER — MIDAZOLAM HCL 10 MG/2ML IJ SOLN
INTRAMUSCULAR | Status: DC | PRN
  Administered 2014-10-18: 2.5 mg via INTRAVENOUS

## 2014-10-18 MED ORDER — MEPERIDINE HCL 100 MG/ML IJ SOLN
INTRAMUSCULAR | Status: AC
  Filled 2014-10-18: qty 2

## 2014-10-18 MED ORDER — SODIUM CHLORIDE 0.9 % IV SOLN
INTRAVENOUS | Status: DC
  Administered 2014-10-18: 08:00:00 via INTRAVENOUS

## 2014-10-18 MED ORDER — MEPERIDINE HCL 100 MG/ML IJ SOLN: 100.0000 mg | Freq: Once | INTRAMUSCULAR | Status: DC

## 2014-10-18 MED ORDER — LIDOCAINE HCL 2 % EX GEL: 1.0000 "application " | Freq: Once | CUTANEOUS | Status: DC

## 2014-10-18 MED ORDER — LIDOCAINE HCL 1 % IJ SOLN
INTRAMUSCULAR | Status: DC | PRN
  Administered 2014-10-18: 6 mL via RESPIRATORY_TRACT

## 2014-10-18 MED ORDER — MIDAZOLAM HCL 10 MG/2ML IJ SOLN
INTRAMUSCULAR | Status: AC
  Filled 2014-10-18: qty 4

## 2014-10-18 MED ORDER — LIDOCAINE HCL 2 % EX GEL
CUTANEOUS | Status: DC | PRN
  Administered 2014-10-18: 1

## 2014-10-18 MED ORDER — PHENYLEPHRINE HCL 0.25 % NA SOLN
NASAL | Status: DC | PRN
  Administered 2014-10-18: 2 via NASAL

## 2014-10-18 MED ORDER — MIDAZOLAM HCL 10 MG/2ML IJ SOLN: 1.0000 mg | Freq: Once | INTRAMUSCULAR | Status: DC

## 2014-10-18 NOTE — Discharge Instructions (Signed)
Flexible Bronchoscopy, Care After These instructions give you information on caring for yourself after your procedure. Your doctor may also give you more specific instructions. Call your doctor if you have any problems or questions after your procedure. HOME CARE  Do not eat or drink anything for 2 hours after your procedure. If you try to eat or drink before the medicine wears off, food or drink could go into your lungs. You could also burn yourself.  After 2 hours have passed and when you can cough and gag normally, you may eat soft food and drink liquids slowly.  The day after the test, you may eat your normal diet.  You may do your normal activities.  Keep all doctor visits. GET HELP RIGHT AWAY IF:  You get more and more short of breath.  You get light-headed.  You feel like you are going to pass out (faint).  You have chest pain.  You have new problems that worry you.  You cough up more than a little blood.  You cough up more blood than before. MAKE SURE YOU:  Understand these instructions.  Will watch your condition.  Will get help right away if you are not doing well or get worse. Document Released: 03/22/2009 Document Revised: 05/30/2013 Document Reviewed: 01/27/2013 [facility] Patient Information 2015 [place], [place]. This information is not intended to replace advice given to you by your health care provider. Make sure you discuss any questions you have with your health care provider.  Nothing to eat and drink until  10:30  am  Today   10/18/2014.

## 2014-10-18 NOTE — H&P (Signed)
HPI  56 yobf active smoker with FTT x 2015 and new RUL mass on eval of 40 lb wt loss seen at request of Dr Carlis Abbott for lung mass.    10/12/2014 1st [facility] office visit/ Jozeph Persing  Chief Complaint  Patient presents with  . Advice Only    referred by Dr. Carlis Abbott for lung mass.CT in epic.    Indolent onset x 1 year progressively worse doe/ wt loss/ can't walk 50 ft with giving out "just tired"  No cough/ hemoptysis/ pleuritic cp  No obvious other patterns in day to day or daytime variabilty or assoc chronic cough or cp or chest tightness, subjective wheeze overt sinus or hb symptoms. No unusual exp hx or h/o childhood pna/ asthma or knowledge of premature birth.  Sleeping ok without nocturnal or early am exacerbation of respiratory c/o's or need for noct saba. Also denies any obvious fluctuation of symptoms with weather or environmental changes or other aggravating or alleviating factors except as outlined above   Current Medications, Allergies, Complete Past Medical History, Past Surgical History, Family History, and Social History were reviewed in [employer] record.       Review of Systems  Constitutional: Negative for fever and unexpected weight change.  HENT: Negative for congestion, dental problem, ear pain, nosebleeds, postnasal drip, rhinorrhea, sinus pressure, sneezing, sore throat and trouble swallowing.  Eyes: Negative for redness and itching.  Respiratory: Positive for chest tightness and shortness of breath. Negative for cough and wheezing.  Cardiovascular: Negative for palpitations and leg swelling.  Gastrointestinal: Negative for nausea and vomiting.  Genitourinary: Negative for dysuria.  Musculoskeletal: Negative for joint swelling.  Skin: Negative for rash.  Neurological: Negative for headaches.  Hematological: Does not bruise/bleed easily.  Psychiatric/Behavioral: Negative for dysphoric mood. The patient is not  nervous/anxious.       Objective:   Physical Exam   amb bf nad/ quite thin and anxious, not clear she's understanding potential impact of her problem   Wt Readings from Last 3 Encounters:  10/12/14 109 lb (49.442 kg)  02/27/14 120 lb (54.432 kg)  01/30/14 119 lb (53.978 kg)    Vital signs reviewed   HEENT: nl dentition, turbinates, and orophanx. Nl external ear canals without cough reflex   NECK : without JVD/Nodes/TM/ nl carotid upstrokes bilaterally   LUNGS: no acc muscle use, clear to A and P bilaterally without cough on insp or exp maneuvers   CV: RRR no s3 or murmur or increase in P2, no edema   ABD: soft and nontender with nl excursion in the supine position. No bruits or organomegaly, bowel sounds nl  MS: warm without deformities, calf tenderness, cyanosis or clubbing  SKIN: warm and dry without lesions   NEURO: alert, approp, no deficits    I personally reviewed images and agree with radiology impression as follows:  CT with contrast 10/08/14  Macro lobulated 5.8 x 4.4 x 3.6 cm diameter RIGHT upper lobe mass with scattered spiculated margins consistent with a primary pulmonary malignancy.  No thoracic adenopathy.      Assessment & Plan:            Revision History       Date/Time User Action    > 10/13/2014 7:08 AM Tanda Rockers, MD Sign     10/12/2014 3:27 PM Len Blalock, CMA Sign at close encounter              Mass of lung - Tanda Rockers,  MD at 10/13/2014 7:05 AM     Status: Written Related Problem: Mass of lung   Expand All Collapse All   I had an extended discussion with the patient reviewing all relevant studies completed to date and lasting 22mnutes on the following ongoing concerns:    1) this is almost certainly a tumor and is not operable per pfts and likely not resectable for cure either though would require PET for staging  2) best option is get tissue dx from fob and go  from there (reviewed 3 paths: Prayer, chemo, rt, with the former not necessarily mutually exclusive from the latter 2 options)   3) Discussed in detail all the indications, usual risks and alternatives relative to the benefits with patient who wants to think some more about it   4) tentatively scheduled for 10/18/14 if she agrees             COPD gold IV/ still smoking - MTanda Rockers MD at 10/13/2014 7:07 AM     Status: Written Related Problem: COPD gold IV/ still smoking    Expand All Collapse All   Spirometry 10/12/14 fev1 0.5 (27%) ratio 39   This makes her inoperable and critical that she stop smoking now (see sep a/p)            Cigarette smoker - MTanda Rockers MD at 10/13/2014 7:08 AM     Status: Written Related Problem: Cigarette smoker   Expand All Collapse All   > 3 min discussion I emphasized that although we never turn away smokers from the pulmonary clinic, we do ask that they understand that the recommendations that we make won't work nearly as well in the presence of continued cigarette exposure.  In fact, we may very well reach a point where we can't promise to help the patient if he/she can't quit smoking. (We can and will promise to try to help, we just can't promise what we recommend will really work)          .10/18/2014   Day of fob/ no change hx or exam.   MChristinia Gully MD Pulmonary and [phone] 5:30 PM or weekends, call [phone]

## 2014-10-18 NOTE — Op Note (Signed)
Bronchoscopy Procedure Note  Date of Operation: 10/18/2014   Pre-op Diagnosis: lung mass RUL  Post-op Diagnosis: same  Surgeon: Christinia Gully  Anesthesia: Monitored Local Anesthesia with Sedation  Operation: Video Flexible fiberoptic bronchoscopy, diagnostic   Findings: nl airways  Specimen: BAL RUL/ Tbbx RU  Estimated Blood Loss: min  Complications: none  Indications and History: See updated H and P same date. The risks, benefits, complications, treatment options and expected outcomes were discussed with the patient.  The possibilities of reaction to medication, pulmonary aspiration, perforation of a viscus, bleeding, failure to diagnose a condition and creating a complication requiring transfusion or operation were discussed with the patient who freely signed the consent.    Description of Procedure: The patient was re-examined in the bronchoscopy suite and the site of surgery properly noted/marked.  The patient was identified  and the procedure verified as Flexible Fiberoptic Bronchoscopy.  A Time Out was held and the above information confirmed.   After the induction of topical nasopharyngeal anesthesia, the patient was positioned  and the bronchoscope was passed through the L naris. The vocal cords were visualized and  1% buffered lidocaine 5 ml was topically placed onto the cords. The cords were nl. The scope was then passed into the trachea.  1% buffered lidocaine given topically. Airways inspected bilaterally to the subsegmental level with the following findings:  All airways opened widely to subsegmental level though there was collapse on expiration    Interventions:  1) BAL RUL 2) TBBx RUL x 4    The Patient was taken to the Endoscopy Recovery area in satisfactory condition.   Attestation: I performed the procedure.  Christinia Gully, MD Pulmonary and [place] [phone] After 5:30 PM or weekends, call [phone]

## 2014-10-18 NOTE — Progress Notes (Signed)
Video Bronchoscopy done  Intervention bronchial washing Intervention bronchial biopsy  Procedure tolerated well 

## 2014-10-19 ENCOUNTER — Other Ambulatory Visit: Payer: Self-pay | Admitting: Internal Medicine

## 2014-10-19 ENCOUNTER — Encounter (HOSPITAL_COMMUNITY): Payer: Self-pay | Admitting: Internal Medicine

## 2014-10-19 DIAGNOSIS — R918 Other nonspecific abnormal finding of lung field: Secondary | ICD-10-CM

## 2014-10-19 NOTE — Progress Notes (Signed)
Quick Note:  Orders sent to [facility] ______ 

## 2014-10-22 ENCOUNTER — Other Ambulatory Visit: Payer: Self-pay | Admitting: *Deleted

## 2014-10-22 ENCOUNTER — Telehealth: Payer: Self-pay | Admitting: Internal Medicine

## 2014-10-22 ENCOUNTER — Telehealth: Payer: Self-pay | Admitting: *Deleted

## 2014-10-22 DIAGNOSIS — R918 Other nonspecific abnormal finding of lung field: Secondary | ICD-10-CM

## 2014-10-22 NOTE — Telephone Encounter (Signed)
Received referral.  Called to schedule for thoracic clinic after PET scan on 11/01/14.  She is aware of appt time and place.

## 2014-10-22 NOTE — Telephone Encounter (Signed)
This has already been scheduled for 11/01/14 at 11am. Pt is aware of appointment date, time and location.  Pt is claustrophobic. She would like something called in for this.  MW - please advise. Thanks.

## 2014-10-22 NOTE — Telephone Encounter (Signed)
ATC x 1  Rang several times with no answer WCB

## 2014-10-22 NOTE — Telephone Encounter (Signed)
Xanax 0.5 mg take x 1 one hour before procedure, ok to take 2nd if needed   #2  No refills

## 2014-10-23 MED ORDER — ALPRAZOLAM 0.5 MG PO TABS: ORAL_TABLET | ORAL | Status: DC

## 2014-10-23 NOTE — Telephone Encounter (Signed)
Called and spoke to pt and informed her of the prescription. Rx called into preferred pharmacy. Pt verbalized understanding and denied any further questions or concerns at this time.

## 2014-10-31 ENCOUNTER — Telehealth: Payer: Self-pay | Admitting: *Deleted

## 2014-10-31 NOTE — Telephone Encounter (Signed)
Called pt and confirmed 11/01/14 clinic appt w/ her.  Gave directions and instructions.

## 2014-11-01 ENCOUNTER — Encounter: Payer: Self-pay | Admitting: Internal Medicine

## 2014-11-01 ENCOUNTER — Other Ambulatory Visit (HOSPITAL_BASED_OUTPATIENT_CLINIC_OR_DEPARTMENT_OTHER): Payer: Medicare Other

## 2014-11-01 ENCOUNTER — Ambulatory Visit
Admission: RE | Admit: 2014-11-01 | Discharge: 2014-11-01 | Disposition: A | Discharge status: Home / Self Care | Payer: Medicare Other | Source: Ambulatory Visit | Attending: Radiation Oncology | Admitting: Radiation Oncology

## 2014-11-01 ENCOUNTER — Ambulatory Visit (HOSPITAL_COMMUNITY)
Admission: RE | Admit: 2014-11-01 | Discharge: 2014-11-01 | Disposition: A | Discharge status: Home / Self Care | Payer: Medicare Other | Source: Ambulatory Visit | Attending: Internal Medicine | Admitting: Internal Medicine

## 2014-11-01 ENCOUNTER — Encounter: Payer: Self-pay | Admitting: *Deleted

## 2014-11-01 ENCOUNTER — Ambulatory Visit (HOSPITAL_BASED_OUTPATIENT_CLINIC_OR_DEPARTMENT_OTHER): Payer: Medicare Other | Admitting: Internal Medicine

## 2014-11-01 ENCOUNTER — Ambulatory Visit: Payer: Medicare Other | Attending: Internal Medicine | Admitting: Physical Therapy

## 2014-11-01 ENCOUNTER — Telehealth: Payer: Self-pay | Admitting: Internal Medicine

## 2014-11-01 ENCOUNTER — Institutional Professional Consult (permissible substitution) (INDEPENDENT_AMBULATORY_CARE_PROVIDER_SITE_OTHER): Payer: Medicare Other | Admitting: Surgery

## 2014-11-01 VITALS — BP 165/89 | HR 110 | Temp 97.9°F | Resp 18 | Ht 64.0 in | Wt 109.5 lb

## 2014-11-01 DIAGNOSIS — R0602 Shortness of breath: Secondary | ICD-10-CM

## 2014-11-01 DIAGNOSIS — I10 Essential (primary) hypertension: Secondary | ICD-10-CM

## 2014-11-01 DIAGNOSIS — R05 Cough: Secondary | ICD-10-CM | POA: Diagnosis not present

## 2014-11-01 DIAGNOSIS — Z801 Family history of malignant neoplasm of trachea, bronchus and lung: Secondary | ICD-10-CM

## 2014-11-01 DIAGNOSIS — R918 Other nonspecific abnormal finding of lung field: Secondary | ICD-10-CM | POA: Insufficient documentation

## 2014-11-01 DIAGNOSIS — C3411 Malignant neoplasm of upper lobe, right bronchus or lung: Secondary | ICD-10-CM

## 2014-11-01 DIAGNOSIS — E46 Unspecified protein-calorie malnutrition: Secondary | ICD-10-CM

## 2014-11-01 DIAGNOSIS — C3491 Malignant neoplasm of unspecified part of right bronchus or lung: Secondary | ICD-10-CM

## 2014-11-01 DIAGNOSIS — R29898 Other symptoms and signs involving the musculoskeletal system: Secondary | ICD-10-CM | POA: Diagnosis present

## 2014-11-01 DIAGNOSIS — Z808 Family history of malignant neoplasm of other organs or systems: Secondary | ICD-10-CM

## 2014-11-01 DIAGNOSIS — J449 Chronic obstructive pulmonary disease, unspecified: Secondary | ICD-10-CM | POA: Diagnosis not present

## 2014-11-01 LAB — COMPREHENSIVE METABOLIC PANEL (CC13)
ALT: 27 U/L (ref 0–55)
ANION GAP: 10 meq/L (ref 3–11)
AST: 27 U/L (ref 5–34)
Albumin: 3.6 g/dL (ref 3.5–5.0)
Alkaline Phosphatase: 124 U/L (ref 40–150)
BUN: 13.2 mg/dL (ref 7.0–26.0)
CO2: 32 mEq/L — ABNORMAL HIGH (ref 22–29)
CREATININE: 1 mg/dL (ref 0.6–1.1)
Calcium: 9.7 mg/dL (ref 8.4–10.4)
Chloride: 100 mEq/L (ref 98–109)
EGFR: 66 mL/min/{1.73_m2} — ABNORMAL LOW (ref >90)
GLUCOSE: 214 mg/dL — AB (ref 70–140)
Potassium: 4.7 mEq/L (ref 3.5–5.1)
SODIUM: 142 meq/L (ref 136–145)
Total Bilirubin: 1.67 mg/dL — ABNORMAL HIGH (ref 0.20–1.20)
Total Protein: 7.3 g/dL (ref 6.4–8.3)

## 2014-11-01 LAB — CBC WITH DIFFERENTIAL/PLATELET
BASO%: 0.4 % (ref 0.0–2.0)
Basophils Absolute: 0 10*3/uL (ref 0.0–0.1)
EOS%: 0.8 % (ref 0.0–7.0)
Eosinophils Absolute: 0.1 10*3/uL (ref 0.0–0.5)
HCT: 52.9 % — ABNORMAL HIGH (ref 34.8–46.6)
HGB: 16.7 g/dL — ABNORMAL HIGH (ref 11.6–15.9)
LYMPH%: 24.4 % (ref 14.0–49.7)
MCH: 27.4 pg (ref 25.1–34.0)
MCHC: 31.6 g/dL (ref 31.5–36.0)
MCV: 86.7 fL (ref 79.5–101.0)
MONO#: 0.6 10*3/uL (ref 0.1–0.9)
MONO%: 6.4 % (ref 0.0–14.0)
NEUT#: 6.7 10*3/uL — ABNORMAL HIGH (ref 1.5–6.5)
NEUT%: 68 % (ref 38.4–76.8)
Platelets: 240 10*3/uL (ref 145–400)
RBC: 6.1 10*6/uL — ABNORMAL HIGH (ref 3.70–5.45)
RDW: 14.3 % (ref 11.2–14.5)
WBC: 9.9 10*3/uL (ref 3.9–10.3)
lymph#: 2.4 10*3/uL (ref 0.9–3.3)

## 2014-11-01 LAB — GLUCOSE, CAPILLARY: GLUCOSE-CAPILLARY: 90 mg/dL (ref 65–99)

## 2014-11-01 MED ORDER — FLUDEOXYGLUCOSE F - 18 (FDG) INJECTION
5.4500 | Freq: Once | INTRAVENOUS | Status: AC | PRN
  Administered 2014-11-01: 5.45 via INTRAVENOUS

## 2014-11-01 MED ORDER — METHYLPREDNISOLONE 4 MG PO TBPK: ORAL_TABLET | ORAL | Status: DC

## 2014-11-01 NOTE — Telephone Encounter (Signed)
per pof to sch pt appt-sent [facility] email to advise no slots on his sch 6/9-adv put on Ad[facility]'s sch-ckf PFT left message to call pt to make appt for PFT-adv pt-pt understood-gave pt copy of sch

## 2014-11-01 NOTE — Therapy (Signed)
[place], [place], [address] Phone: [phone]   Fax:  [phone]  Physical Therapy Evaluation  Patient Details  Name: Susan Garrett MRN: [id number] Date of Birth: 1935-06-02 Referring Provider:  Curt Bears, MD  Encounter Date: 11/01/2014      PT End of Session - 11/01/14 1526    Visit Number 1   Number of Visits 1   PT Start Time 1450   PT Stop Time 1515   PT Time Calculation (min) 25 min   Activity Tolerance Patient tolerated treatment well   Behavior During Therapy [facility] for tasks assessed/performed      Past Medical History  Diagnosis Date  . Hypertension   . Corns and callosities   . Callus   . Hyperthyroidism     Past Surgical History  Procedure Laterality Date  . Lesion removal Left 11/12/87  . Vaginal hysterectomy    . Video bronchoscopy Bilateral 10/18/2014    Procedure: VIDEO BRONCHOSCOPY WITH FLUORO;  Surgeon: Tanda Rockers, MD;  Location: [facility];  Service: Endoscopy;  Laterality: Bilateral;    There were no vitals filed for this visit.  Visit Diagnosis:  Muscular deconditioning - Plan: PT plan of care cert/re-cert  Exertional shortness of breath - Plan: PT plan of care cert/re-cert      Subjective Assessment - 11/01/14 1517    Subjective Pt. reports some shortness of breath with walking a distance   Patient is accompained by: Family member  3 nieces   Pertinent History Pt. presented with shortness of breath and weight loss; had CT chest/abdomen/pelvis on 10/11/14 and bronchoscopy on 10/18/14.  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  Pt. may have surgery depending on her PFTs; if not, she will have chemoradiation.   Currently in Pain? No/denies            [facility] PT Assessment - 11/01/14 0001    Precautions   Precautions Other (comment)   Precaution Comments cancer precautions   Restrictions   Weight  Bearing Restrictions No   Balance Screen   Has the patient fallen in the past 6 months No   Has the patient had a decrease in activity level because of a fear of falling?  No   Is the patient reluctant to leave their home because of a fear of falling?  No   Home Environment   Living Environment Private residence   Living Arrangements Alone  niece helps   Available Help at Discharge Family   Type of [place] Multi-level   Prior Function   Level of Independence Independent   Leisure no regular exercise; walks up and down stairs at home 2-3 x/day; walks to mailbox and stops at car to rest for SOB   Observation/Other Assessments   Observations somewhat frail-appearing older woman   Functional Tests   Functional tests Sit to Stand   Sit to Stand   Comments 10 times in 30 seconds, below average   Posture/Postural Control   Posture/Postural Control Postural limitations   Postural Limitations Rounded Shoulders   ROM / Strength   AROM / PROM / Strength AROM   AROM   Overall AROM  Deficits   Overall AROM Comments grossly 15% loss of all trunk motions in standing   Ambulation/Gait   Ambulation/Gait Yes   Ambulation/Gait Assistance 6: Modified independent (Device/Increase time)  limited by shortness of breath with distance  Assistive device Other (Comment)  she uses none; has walker, cane of her husband's; bath bench   Stairs Yes   Stairs Assistance 6: Modified independent (Device/Increase time)   Stair Management Technique Two rails   Balance   Balance Assessed Yes   Dynamic Standing Balance   Dynamic Standing - Comments reaches 12 inches forward in standing                           PT Education - November 10, 2014 1525    Education provided Yes   Education Details posture, breathing, walking or other exercise such as stationary biking or pool exercise, energy conservation, cough splinting, PT info   Person(s) Educated Patient;Other (comment)  3  nieces   Methods Explanation;Handout   Comprehension Verbalized understanding               Lung Clinic Goals - 2014-11-10 1531    Patient will be able to verbalize understanding of the benefit of exercise to decrease fatigue.   Status Achieved   Patient will be able to verbalize the importance of posture.   Status Achieved   Patient will be able to demonstrate diaphragmatic breathing for improved lung function.   Status Achieved   Patient will be able to verbalize understanding of the role of physical therapy to prevent functional decline and who to contact if physical therapy is needed.   Status Achieved             Plan - 10-Nov-2014 1527    Clinical Impression Statement Frail appearing woman recently diagnosed with squamous cell lung cancer and may have surgery or else chemoradiation; has shortness of breath with walking currently and does no regular exercise.  She also reports a h/o foot problems for which she has had podiatric care, but still has discomfort with walking.   Pt will benefit from skilled therapeutic intervention in order to improve on the following deficits Decreased endurance;Cardiopulmonary status limiting activity   Rehab Potential Fair   PT Frequency One time visit   PT Treatment/Interventions Patient/family education   PT Next Visit Plan None at this time; patient may benefit from therapy for endurance going forward.   PT Home Exercise Plan see education section   Consulted and Agree with Plan of Care Patient          G-Codes - Nov 10, 2014 1531    Functional Assessment Tool Used judgement   Functional Limitation Mobility: Walking and moving around   Mobility: Walking and Moving Around Current Status [phone]) At least 40 percent but less than 60 percent impaired, limited or restricted   Mobility: Walking and Moving Around Goal Status [phone]) At least 20 percent but less than 40 percent impaired, limited or restricted   Mobility: Walking and Moving  Around Discharge Status [phone]) At least 40 percent but less than 60 percent impaired, limited or restricted       Problem List Patient Active Problem List   Diagnosis Date Noted  . Primary cancer of right lung November 10, 2014  . Mass of lung 10/13/2014  . COPD  gold IV/ still smoking  10/13/2014  . Cigarette smoker 10/13/2014  . Dyspnea 10/12/2014  . Pain in joint, ankle and foot 12/30/2012  . Callus 10/04/2012    SALISBURY,DONNA 11-10-2014, 3:35 PM  [place], [place], [address] Phone: [phone]   Fax:  [phone]      Lung Clinic Goals - 11-10-14 1531  Patient will be able to verbalize understanding of the benefit of exercise to decrease fatigue.   Status Achieved   Patient will be able to verbalize the importance of posture.   Status Achieved   Patient will be able to demonstrate diaphragmatic breathing for improved lung function.   Status Achieved   Patient will be able to verbalize understanding of the role of physical therapy to prevent functional decline and who to contact if physical therapy is needed.   Status Achieved     Patient will follow up at outpatient cancer rehab if needed.  If the patient does require further physical therapy, a specific plan will be dictated and sent to the referring physician for approval at that time. The patient was educated today on the importance of posture, diaphragmatic breathing, energy conservation, and a progressive walking program.    Serafina Royals, PT 11/01/2014 3:35 PM

## 2014-11-01 NOTE — Progress Notes (Signed)
Oncology Nurse Navigator Documentation  Oncology Nurse Navigator Flowsheets 11/01/2014  Navigator Encounter Type Clinic/MDC  Patient Visit Type Initial  Treatment Phase Other  Barriers/Navigation Needs Education  Education Understanding Cancer/ Treatment Options  Support Groups/Services Friends and Family  Time Spent with Patient 15         Thoracic Treatment Summary Name:Susan Garrett Date:11/01/2014 DOB:05/14/1935 Your Medical Team Medical Oncologist:Dr. Mohamed Radiation Oncologist:Dr. Tammi Klippel Pulmonologist: Dr. Melvyn Novas Surgeon:Dr. Bartle Type and Stage of Lung Cancer Non-Small Cell Carcinoma: Squamous Cell   Clinical Stage:  Stage II  Clinical stage is based on radiology exams.  Pathological stage will be determined after surgery.  Staging is based on the size of the tumor, involvement of lymph nodes or not, and whether or not the cancer center has spread. Recommendations Recommendations: Chemo and radiation therapy  These recommendations are based on information available as of today's consult.  This is subject to change depending further testing or exams. Next Steps Next Step: Medical Oncology will set up follow up appointments  Radiation Oncology will set up follow up appointments Barriers to Care What do you perceive as a potential barrier that may prevent you from receiving your treatment plan? Education information on lung cancer  Risk manager Advocates will be notified to follow up due to treatment plan of chemotherapy   Resources Given: NCI Booklet on [place] at [email or web address] [phone] What to expect at [facility] information Fall Risk Information   Questions Norton Blizzard, RN BSN Thoracic Oncology Nurse Navigator at [place] is a nurse navigator that is available to assist you through your cancer journey.  She can answer your questions and/or provide resources regarding your  treatment plan, emotional support, or financial concerns.

## 2014-11-01 NOTE — Progress Notes (Signed)
[place] Clinical Social Work  Clinical Social Work met with patient/family and Futures trader at [facility] appointment to offer support and assess for psychosocial needs.  Medical oncologist reviewed patient's diagnosis and recommended treatment plan with patient/family.  Patient was accompanied by three nieces.  She shared her main concern is that she will be unable to live in her home independently.  CSW and patient/family discussed ways to maximize independence as well as provide additional support in the home.  Ms. Mahl and patient's nieces shared she has lack of appetite- CSW reccommended referral to dietitian.    Clinical Social Work briefly discussed Clinical Social Work role and [employer] support programs/services.  Clinical Social Work encouraged patient to call with any additional questions or concerns.   Polo Riley, MSW, LCSW, OSW-C Clinical Social Worker [facility] [phone]

## 2014-11-01 NOTE — Progress Notes (Signed)
[facility] Telephone:([phone]   [phone] Multidisciplinary thoracic oncology clinic  CONSULT NOTE  REFERRING PHYSICIAN: Dr. Christinia Gully  REASON FOR CONSULTATION:  80 years old African-American female recently diagnosed with lung cancer.  HPI Susan Garrett is a 80 y.o. female with past medical history significant for COPD, dyslipidemia, hypertension as well as long history of smoking but quit 2 months ago. The patient has been complaining of increasing fatigue and weakness as well as shortness of breath and weight loss of around 40 pounds over the last several months. She was seen by her primary care physician Dr. Jeanann Lewandowsky and CT scan of the chest, abdomen and pelvis were performed on 10/11/2014 and it showed macrolobulated mass with scattered spiculated margins in the right upper lobe measuring 5.8 x 4.4 x 3.6 cm in size compatible with a primary pulmonary malignancy. There was also tiny nodule in the left lower lobe measuring 3 mm.  The patient was referred to Dr. Herbie Baltimore and on 10/18/2014 she underwent a video flexible fiberoptic bronchoscopy with bronchoalveolar lavage of the right upper lobe and transbronchial biopsy of the right upper lobe. The final pathology (Accession: [id number]) showed invasive squamous cell carcinoma.  A PET scan was also performed on 11/01/2014 earlier today and it showed the large spiculated right upper lobe mass was hypermetabolic consistent with primary bronchogenic carcinoma. There was indeterminate low level activity within the right hilum which could reflect nodal metastasis. No mediastinal adenopathy or distant metastasis is identified. There was also minimal hypermetabolic activity in the left thyroid lobe of doubtful significance. Dr. Melvyn Novas kindly referred the patient to the multidisciplinary thoracic oncology clinic today for further evaluation and recommendation regarding treatment of her condition. When seen today the  patient continues to complain of shortness breath with exertion as well as nonproductive cough with no hemoptysis. She has no more weight loss. She denied having any significant nausea or vomiting. She has no headache or visual changes. Family history significant for mother with gastric cancer at age 16, father had heart attack and brother had lung cancer at age 40. The patient is a widow and has no children. She was accompanied today by 3 of her nieces including Christophe Louis, Ob/Gyn physician, Otila Kluver and Channel. The patient has a history of smoking less than one pack per day for around 50 years and quit 2 months ago. She has no history of alcohol or drug abuse.  HPI  Past Medical History  Diagnosis Date  . Hypertension   . Corns and callosities   . Callus   . Hyperthyroidism     Past Surgical History  Procedure Laterality Date  . Lesion removal Left 11/12/87  . Vaginal hysterectomy    . Video bronchoscopy Bilateral 10/18/2014    Procedure: VIDEO BRONCHOSCOPY WITH FLUORO;  Surgeon: Tanda Rockers, MD;  Location: [facility];  Service: Endoscopy;  Laterality: Bilateral;    Family History  Problem Relation Age of Onset  . Heart disease Father   . Asthma Father   . Cancer Mother     gastric  . Cancer Brother     lung    Social History History  Substance Use Topics  . Smoking status: Former Smoker -- 0.50 packs/day for 55 years    Types: Cigarettes    Quit date: 08/07/2014  . Smokeless tobacco: Never Used     Comment: smokes 0-4 cigs daily  . Alcohol Use: No    Allergies  Allergen Reactions  .  Shellfish Allergy     Current Outpatient Prescriptions  Medication Sig Dispense Refill  . ALPRAZolam (XANAX) 0.5 MG tablet Take 1 tab one hour before procedure, take second tab if needed 2 tablet 0  . aspirin 81 MG tablet Take 81 mg by mouth daily.    . metoprolol succinate (TOPROL-XL) 25 MG 24 hr tablet Take 25 mg by mouth daily.      No current facility-administered medications for  this visit.    Review of Systems  Constitutional: positive for fatigue and weight loss Eyes: negative Ears, nose, mouth, throat, and face: negative Respiratory: positive for cough and dyspnea on exertion Cardiovascular: negative Gastrointestinal: negative Genitourinary:negative Integument/breast: negative Hematologic/lymphatic: negative Musculoskeletal:positive for muscle weakness Neurological: negative Behavioral/Psych: negative Endocrine: negative Allergic/Immunologic: negative  Physical Exam  ZYS:AYTKZ, healthy, no distress, ill looking and malnourished SKIN: skin color, texture, turgor are normal, no rashes or significant lesions HEAD: Normocephalic, No masses, lesions, tenderness or abnormalities EYES: normal, PERRLA EARS: External ears normal, Canals clear OROPHARYNX:no exudate, no erythema and lips, buccal mucosa, and tongue normal  NECK: supple, no adenopathy, no JVD LYMPH:  no palpable lymphadenopathy, no hepatosplenomegaly BREAST:not examined LUNGS: clear to auscultation , and palpation HEART: regular rate & rhythm and no murmurs ABDOMEN:abdomen soft, non-tender, normal bowel sounds and no masses or organomegaly BACK: Back symmetric, no curvature., No CVA tenderness EXTREMITIES:no joint deformities, effusion, or inflammation, no edema, no skin discoloration  NEURO: alert & oriented x 3 with fluent speech, no focal motor/sensory deficits  PERFORMANCE STATUS: ECOG 1  LABORATORY DATA: Lab Results  Component Value Date   WBC 9.9 11/01/2014   HGB 16.7* 11/01/2014   HCT 52.9* 11/01/2014   MCV 86.7 11/01/2014   PLT 240 11/01/2014      Chemistry      Component Value Date/Time   NA 142 11/01/2014 1345   K 4.7 11/01/2014 1345   CO2 32* 11/01/2014 1345   BUN 13.2 11/01/2014 1345   CREATININE 1.0 11/01/2014 1345   CREATININE 1.00 10/11/2014 1616      Component Value Date/Time   CALCIUM 9.7 11/01/2014 1345   ALKPHOS 124 11/01/2014 1345   AST 27 11/01/2014  1345   ALT 27 11/01/2014 1345   BILITOT 1.67* 11/01/2014 1345       RADIOGRAPHIC STUDIES: Ct Chest W Contrast  10/11/2014   CLINICAL DATA:  Shortness of breath, 40 pound weight loss in 1 year, hypertension, former smoker, past history appendectomy and hysterectomy  EXAM: CT CHEST, ABDOMEN, AND PELVIS WITH CONTRAST  TECHNIQUE: Multidetector CT imaging of the chest, abdomen and pelvis was performed following the standard protocol during bolus administration of intravenous contrast. Sagittal and coronal MPR images reconstructed from axial data set.  CONTRAST:  11m OMNIPAQUE IOHEXOL 300 MG/ML SOLN IV. Dilute oral contrast.  COMPARISON:  None  FINDINGS: CT CHEST FINDINGS  Scattered atherosclerotic calcifications.  No thoracic adenopathy.  Macro lobulated mass with scattered spiculated margins in RIGHT upper lobe 5.8 x 4.4 x 3.6 cm in size compatible with a primary pulmonary malignancy.  Underlying emphysematous changes.  Tiny nodule LEFT lower lobe 3 mm diameter image 40, questionably calcified.  Remaining lungs clear.  No additional mass, nodule, infiltrate, pleural effusion, or pneumothorax.  No definite osseous metastatic lesions.  CT ABDOMEN AND PELVIS FINDINGS  Multiple hypervascular foci within liver demonstrating washout on delayed images.  Remainder of liver, spleen, pancreas, kidneys, and adrenal glands normal.  Abdominal aortic aneurysm 3.3 x 3.6 cm image 72 extending to aortic bifurcation  with dilatation of the RIGHT common iliac artery 2.3 cm diameter and LEFT common iliac artery 1.6 cm diameter.  Gallbladder extends into a supraumbilical ventral hernia, suspect containing tiny calculi.  Gallbladder wall is upper normal in thickness.  Unremarkable bladder and ureters.  Uterus surgically absent with nonvisualization of ovaries.  Interposition of colon between liver and lateral RIGHT abdominal wall.  Stomach and bowel loops otherwise unremarkable.  No additional mass, adenopathy, free fluid or free  air.  Bones demineralized.  IMPRESSION: Macro lobulated 5.8 x 4.4 x 3.6 cm diameter RIGHT upper lobe mass with scattered spiculated margins consistent with a primary pulmonary malignancy.  No thoracic adenopathy.  Multiple areas of arterial phase enhancement within the liver, seen on early images with washout on delayed images ; these are nonspecific and could be seen with atypical hemangiomata, transient hepatic attenuation differences, hypervascular metastases fell less likely due to appearance but recommend correlation with MR imaging with and without contrast to definitively exclude hepatic metastases.  Emphysematous changes with tiny LEFT lower lobe nodule 3 mm diameter, questionably calcified.  Abdominal aortic aneurysm 3.3 x 3.6 cm extending into the common iliac arteries bilaterally.  Small supraumbilical ventral hernia containing the gallbladder, potentially containing tiny gallstones.   Electronically Signed   By: Lavonia Dana M.D.   On: 10/11/2014 16:54   Ct Abdomen Pelvis W Contrast  10/11/2014   CLINICAL DATA:  Shortness of breath, 40 pound weight loss in 1 year, hypertension, former smoker, past history appendectomy and hysterectomy  EXAM: CT CHEST, ABDOMEN, AND PELVIS WITH CONTRAST  TECHNIQUE: Multidetector CT imaging of the chest, abdomen and pelvis was performed following the standard protocol during bolus administration of intravenous contrast. Sagittal and coronal MPR images reconstructed from axial data set.  CONTRAST:  70m OMNIPAQUE IOHEXOL 300 MG/ML SOLN IV. Dilute oral contrast.  COMPARISON:  None  FINDINGS: CT CHEST FINDINGS  Scattered atherosclerotic calcifications.  No thoracic adenopathy.  Macro lobulated mass with scattered spiculated margins in RIGHT upper lobe 5.8 x 4.4 x 3.6 cm in size compatible with a primary pulmonary malignancy.  Underlying emphysematous changes.  Tiny nodule LEFT lower lobe 3 mm diameter image 40, questionably calcified.  Remaining lungs clear.  No additional  mass, nodule, infiltrate, pleural effusion, or pneumothorax.  No definite osseous metastatic lesions.  CT ABDOMEN AND PELVIS FINDINGS  Multiple hypervascular foci within liver demonstrating washout on delayed images.  Remainder of liver, spleen, pancreas, kidneys, and adrenal glands normal.  Abdominal aortic aneurysm 3.3 x 3.6 cm image 72 extending to aortic bifurcation with dilatation of the RIGHT common iliac artery 2.3 cm diameter and LEFT common iliac artery 1.6 cm diameter.  Gallbladder extends into a supraumbilical ventral hernia, suspect containing tiny calculi.  Gallbladder wall is upper normal in thickness.  Unremarkable bladder and ureters.  Uterus surgically absent with nonvisualization of ovaries.  Interposition of colon between liver and lateral RIGHT abdominal wall.  Stomach and bowel loops otherwise unremarkable.  No additional mass, adenopathy, free fluid or free air.  Bones demineralized.  IMPRESSION: Macro lobulated 5.8 x 4.4 x 3.6 cm diameter RIGHT upper lobe mass with scattered spiculated margins consistent with a primary pulmonary malignancy.  No thoracic adenopathy.  Multiple areas of arterial phase enhancement within the liver, seen on early images with washout on delayed images ; these are nonspecific and could be seen with atypical hemangiomata, transient hepatic attenuation differences, hypervascular metastases fell less likely due to appearance but recommend correlation with MR imaging with and without  contrast to definitively exclude hepatic metastases.  Emphysematous changes with tiny LEFT lower lobe nodule 3 mm diameter, questionably calcified.  Abdominal aortic aneurysm 3.3 x 3.6 cm extending into the common iliac arteries bilaterally.  Small supraumbilical ventral hernia containing the gallbladder, potentially containing tiny gallstones.   Electronically Signed   By: Lavonia Dana M.D.   On: 10/11/2014 16:54   Nm Pet Image Initial (pi) Skull Base To Thigh  11/01/2014   CLINICAL DATA:   Initial treatment strategy for right lung mass.  EXAM: NUCLEAR MEDICINE PET SKULL BASE TO THIGH  TECHNIQUE: 5.45 mCi F-18 FDG was injected intravenously. Full-ring PET imaging was performed from the skull base to thigh after the radiotracer. CT data was obtained and used for attenuation correction and anatomic localization.  FASTING BLOOD GLUCOSE:  Value: 90 mg/dl  COMPARISON:  CT of the chest, abdomen and pelvis 10/11/2014.  FINDINGS: NECK  No hypermetabolic cervical lymph nodes are identified.There are no lesions of the pharyngeal mucosal space. There is minimal hypermetabolic activity within the left thyroid lobe (SUV max 2.9). This is of doubtful significance.  CHEST  The large spiculated right upper lobe mass is markedly hypermetabolic. This lesion measures 5.2 x 4.1 cm and has an SUV max of 19.0. No other hypermetabolic pulmonary lesions are identified. There is moderate diffuse emphysema. Within the right hilum, there is low level hypermetabolic activity (SUV max 3.3) which may correspond with a small lymph node on prior CT. No hypermetabolic mediastinal lymph nodes are present.  ABDOMEN/PELVIS  There is no hypermetabolic activity within the liver, adrenal glands, spleen or pancreas. There is no hypermetabolic nodal activity. The gallbladder is herniated through a supraumbilical hernia as noted previously. There is probable diffuse gallbladder adenomyomatosis. 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  SKELETON  There is no hypermetabolic activity to suggest osseous metastatic disease.  IMPRESSION: 1. The large spiculated right upper lobe mass is hypermetabolic consistent with primary bronchogenic carcinoma. There is indeterminate low level activity within the right hilum which could reflect nodal metastasis. No mediastinal adenopathy or distant metastases identified. If the hilum is involved, this corresponds with stage IIB disease (T2bN1M0). 2. Minimal hypermetabolic  activity in the left thyroid lobe, of doubtful significance. 3. Gallbladder remains herniated through a supraumbilical hernia.   Electronically Signed   By: Richardean Sale M.D.   On: 11/01/2014 13:14   Dg Chest Port 1 View  10/18/2014   CLINICAL DATA:  Status post bronchoscopy with biopsy. Rule out pneumothorax.  EXAM: PORTABLE CHEST - 1 VIEW  COMPARISON:  Chest CT 10/11/2014  FINDINGS: The heart is enlarged. Hyperinflated lungs. Bibasilar atelectasis. Again noted is right upper lobe mass as seen on previous CT exam. No pneumothorax identified following bronchoscopy. No pulmonary edema.  IMPRESSION: 1. No pneumothorax following bronchoscopy. 2. Persistent right upper lobe mass. 3. Hyperinflation.   Electronically Signed   By: Nolon Nations M.D.   On: 10/18/2014 09:14   Dg C-arm Bronchoscopy  10/18/2014   CLINICAL DATA:    C-ARM BRONCHOSCOPY  Fluoroscopy was utilized by the requesting physician.  No radiographic  interpretation.     ASSESSMENT: This is a very pleasant 80 years old African-American female recently diagnosed with a stage IIB (T2b, N1, M0) non-small cell lung cancer, invasive squamous cell carcinoma diagnosed in May 2016 and presented with large right upper lobe mass with questionable right hilar adenopathy.   PLAN: I had a lengthy discussion with the patient and her family  today about her current disease stage, prognosis and treatment options. The patient has early-stage disease and she is potentially resectable if her pulmonary function are appropriate but this is unlikely taken into consideration her history of COPD and long history of smoking. She will be seen later today by Dr. Cyndia Bent for evaluation and discussion of the surgical option. I will complete the staging workup by ordering a MRI of the brain to rule out brain metastasis. If the patient is not a surgical candidate for resection, she would be considered for a course of concurrent chemoradiation with weekly carboplatin  for AUC of 2 and paclitaxel 45 MG/M2 for a total of 6-7 weeks depending on the final dose of radiation. She will be seen later today by Dr. Tammi Klippel for evaluation and discussion of the radiotherapy option. I will arrange for the patient to come back for follow-up visit in 2 weeks for reevaluation and more detailed discussion of her treatment options if she is not a surgical candidate. For malnutrition, I will refer the patient to the dietitian at the [facility] for evaluation. I would also start the patient on Medrol Dosepak to stimulate her appetite. The patient was seen during the multidisciplinary thoracic oncology clinic today by medical oncology, radiation oncology, thoracic surgeon, thoracic navigator, social worker as well as physical therapist. She was advised to call immediately if she has any concerning symptoms in the interval.  The patient voices understanding of current disease status and treatment options and is in agreement with the current care plan.  All questions were answered. The patient knows to call the clinic with any problems, questions or concerns. We can certainly see the patient much sooner if necessary.  Thank you so much for allowing me to participate in the care of [facility]. I will continue to follow up the patient with you and assist in her care.  I spent 40 minutes counseling the patient face to face. The total time spent in the appointment was 60 minutes.  Disclaimer: This note was dictated with voice recognition software. Similar sounding words can inadvertently be transcribed and may not be corrected upon review.   Susan Garrett K. Nov 01, 2014, 2:54 PM

## 2014-11-01 NOTE — Progress Notes (Signed)
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    ICD-9-CM ICD-10-CM   1. Mass of lung 786.6 R91.8     HISTORY OF PRESENT ILLNESS:Susan Garrett is a 80 year old woman presenting with dyspnea and 40 lb. weight loss noted a over one year time period.  CT of the chest, abd, and pelvis on 10/11/2014 showed a Macro lobulated mass with scattered spiculated margins in RIGHT upper lobe 5.8 x 4.4 x 3.6 cm in size compatible with a primary pulmonary malignancy    Bronchoscopy and biopsy with Dr. Melvyn Novas on 20 showed invasive squamous cell carcinoma.   PET-CT was preformed earlier today. This showed the right upper lung mass has a high metabolism consistent with cancer. There is also some activity in the right hilum which may indicate early lymph node involvement. These results prompted the radiologist to stage the cancer as T2bN1M0.  PREVIOUS RADIATION THERAPY: No  PAST MEDICAL HISTORY:  has a past medical history of Hypertension; Corns and callosities; Callus; and Hyperthyroidism.    PAST SURGICAL HISTORY: Past Surgical History  Procedure Laterality Date  . Lesion removal Left 11/12/87  . Vaginal hysterectomy    . Video bronchoscopy Bilateral 10/18/2014    Procedure: VIDEO BRONCHOSCOPY WITH FLUORO;  Surgeon: Tanda Rockers, MD;  Location: [facility];  Service: Endoscopy;  Laterality: Bilateral;    FAMILY HISTORY: family history includes Asthma in her father; Cancer in her brother and mother; Heart disease in her father.  SOCIAL HISTORY:  reports that she quit smoking about 2 months  ago. Her smoking use included Cigarettes. She has a 27.5 pack-year smoking history. She has never used smokeless tobacco. She reports that she does not drink alcohol or use illicit drugs.  ALLERGIES: Shellfish allergy  MEDICATIONS:  Current Outpatient Prescriptions  Medication Sig Dispense Refill  . ALPRAZolam (XANAX) 0.5 MG tablet Take 1 tab one hour before procedure, take second tab if needed 2 tablet 0  . aspirin 81 MG tablet Take 81 mg by mouth daily.    . methylPREDNISolone (MEDROL DOSEPAK) 4 MG TBPK tablet Use as instructed 21 tablet 0  . metoprolol succinate (TOPROL-XL) 25 MG 24 hr tablet Take 25 mg by mouth daily.      No current facility-administered medications for this encounter.    REVIEW OF SYSTEMS:  A 15 point review of systems is documented in the electronic medical record. This was obtained by the nursing staff. However, I reviewed this with the patient to discuss relevant findings and make appropriate changes.  Pertinent items are noted in HPI.   PHYSICAL EXAM:  Vital signs reviewed Per pulmonary medicine:   HEENT: nl dentition, turbinates, and orophanx. Nl external ear canals without cough reflex NECK : without JVD/Nodes/TM/ nl carotid upstrokes bilaterally LUNGS: no acc muscle use, clear to A and P bilaterally without cough on insp or exp maneuvers CV: RRR no s3 or murmur or increase in P2, no edema  ABD: soft and nontender with nl excursion in the supine position. No bruits or organomegaly, bowel sounds nl MS: warm without deformities, calf tenderness, cyanosis or  clubbing SKIN: warm and dry without lesions  NEURO: alert, approp, no deficits   KPS = 90  100 - Normal; no complaints; no evidence of disease. 90   - Able to carry on normal activity; minor signs or symptoms of disease. 80   - Normal activity with effort; some signs or symptoms of disease. 75   - Cares for self; unable to carry on normal activity or to do active work. 60   - Requires occasional  assistance, but is able to care for most of his personal needs. 50   - Requires considerable assistance and frequent medical care. 65   - Disabled; requires special care and assistance. 39   - Severely disabled; hospital admission is indicated although death not imminent. 21   - Very sick; hospital admission necessary; active supportive treatment necessary. 10   - Moribund; fatal processes progressing rapidly. 0     - Dead  Karnofsky DA, Abelmann Canovanas, Craver LS and Burchenal [facility] [phone]) The use of the nitrogen mustards in the palliative treatment of carcinoma: with particular reference to bronchogenic carcinoma Cancer 1 [id number]  LABORATORY DATA:  Lab Results  Component Value Date   WBC 9.9 11/01/2014   HGB 16.7* 11/01/2014   HCT 52.9* 11/01/2014   MCV 86.7 11/01/2014   PLT 240 11/01/2014   Lab Results  Component Value Date   NA 142 11/01/2014   K 4.7 11/01/2014   CO2 32* 11/01/2014   Lab Results  Component Value Date   ALT 27 11/01/2014   AST 27 11/01/2014   ALKPHOS 124 11/01/2014   BILITOT 1.67* 11/01/2014    PULMONARY FUNCTION TEST:  Pending   RADIOGRAPHY: Ct Chest W Contrast  10/11/2014   CLINICAL DATA:  Shortness of breath, 40 pound weight loss in 1 year, hypertension, former smoker, past history appendectomy and hysterectomy  EXAM: CT CHEST, ABDOMEN, AND PELVIS WITH CONTRAST  TECHNIQUE: Multidetector CT imaging of the chest, abdomen and pelvis was performed following the standard protocol during bolus administration of intravenous contrast. Sagittal and coronal MPR images reconstructed from axial data set.  CONTRAST:  94m OMNIPAQUE IOHEXOL 300 MG/ML SOLN IV. Dilute oral contrast.  COMPARISON:  None  FINDINGS: CT CHEST FINDINGS  Scattered atherosclerotic calcifications.  No thoracic adenopathy.  Macro lobulated mass with scattered spiculated margins in RIGHT upper lobe 5.8 x 4.4 x 3.6 cm in size compatible with a primary pulmonary malignancy.  Underlying emphysematous changes.   Tiny nodule LEFT lower lobe 3 mm diameter image 40, questionably calcified.  Remaining lungs clear.  No additional mass, nodule, infiltrate, pleural effusion, or pneumothorax.  No definite osseous metastatic lesions.  CT ABDOMEN AND PELVIS FINDINGS  Multiple hypervascular foci within liver demonstrating washout on delayed images.  Remainder of liver, spleen, pancreas, kidneys, and adrenal glands normal.  Abdominal aortic aneurysm 3.3 x 3.6 cm image 72 extending to aortic bifurcation with dilatation of the RIGHT common iliac artery 2.3 cm diameter and LEFT common iliac artery 1.6 cm diameter.  Gallbladder extends into a supraumbilical ventral hernia, suspect containing tiny calculi.  Gallbladder wall is upper normal in thickness.  Unremarkable bladder and ureters.  Uterus surgically absent with nonvisualization of ovaries.  Interposition of colon between liver and lateral RIGHT abdominal wall.  Stomach and bowel loops otherwise unremarkable.  No additional mass, adenopathy, free fluid or free air.  Bones demineralized.  IMPRESSION: Macro lobulated 5.8 x 4.4 x 3.6 cm diameter RIGHT upper lobe mass with scattered spiculated margins consistent with a  primary pulmonary malignancy.  No thoracic adenopathy.  Multiple areas of arterial phase enhancement within the liver, seen on early images with washout on delayed images ; these are nonspecific and could be seen with atypical hemangiomata, transient hepatic attenuation differences, hypervascular metastases fell less likely due to appearance but recommend correlation with MR imaging with and without contrast to definitively exclude hepatic metastases.  Emphysematous changes with tiny LEFT lower lobe nodule 3 mm diameter, questionably calcified.  Abdominal aortic aneurysm 3.3 x 3.6 cm extending into the common iliac arteries bilaterally.  Small supraumbilical ventral hernia containing the gallbladder, potentially containing tiny gallstones.   Electronically Signed   By: Lavonia Dana M.D.   On: 10/11/2014 16:54   Ct Abdomen Pelvis W Contrast  10/11/2014   CLINICAL DATA:  Shortness of breath, 40 pound weight loss in 1 year, hypertension, former smoker, past history appendectomy and hysterectomy  EXAM: CT CHEST, ABDOMEN, AND PELVIS WITH CONTRAST  TECHNIQUE: Multidetector CT imaging of the chest, abdomen and pelvis was performed following the standard protocol during bolus administration of intravenous contrast. Sagittal and coronal MPR images reconstructed from axial data set.  CONTRAST:  36m OMNIPAQUE IOHEXOL 300 MG/ML SOLN IV. Dilute oral contrast.  COMPARISON:  None  FINDINGS: CT CHEST FINDINGS  Scattered atherosclerotic calcifications.  No thoracic adenopathy.  Macro lobulated mass with scattered spiculated margins in RIGHT upper lobe 5.8 x 4.4 x 3.6 cm in size compatible with a primary pulmonary malignancy.  Underlying emphysematous changes.  Tiny nodule LEFT lower lobe 3 mm diameter image 40, questionably calcified.  Remaining lungs clear.  No additional mass, nodule, infiltrate, pleural effusion, or pneumothorax.  No definite osseous metastatic lesions.  CT ABDOMEN AND PELVIS FINDINGS  Multiple hypervascular foci within liver demonstrating washout on delayed images.  Remainder of liver, spleen, pancreas, kidneys, and adrenal glands normal.  Abdominal aortic aneurysm 3.3 x 3.6 cm image 72 extending to aortic bifurcation with dilatation of the RIGHT common iliac artery 2.3 cm diameter and LEFT common iliac artery 1.6 cm diameter.  Gallbladder extends into a supraumbilical ventral hernia, suspect containing tiny calculi.  Gallbladder wall is upper normal in thickness.  Unremarkable bladder and ureters.  Uterus surgically absent with nonvisualization of ovaries.  Interposition of colon between liver and lateral RIGHT abdominal wall.  Stomach and bowel loops otherwise unremarkable.  No additional mass, adenopathy, free fluid or free air.  Bones demineralized.  IMPRESSION: Macro  lobulated 5.8 x 4.4 x 3.6 cm diameter RIGHT upper lobe mass with scattered spiculated margins consistent with a primary pulmonary malignancy.  No thoracic adenopathy.  Multiple areas of arterial phase enhancement within the liver, seen on early images with washout on delayed images ; these are nonspecific and could be seen with atypical hemangiomata, transient hepatic attenuation differences, hypervascular metastases fell less likely due to appearance but recommend correlation with MR imaging with and without contrast to definitively exclude hepatic metastases.  Emphysematous changes with tiny LEFT lower lobe nodule 3 mm diameter, questionably calcified.  Abdominal aortic aneurysm 3.3 x 3.6 cm extending into the common iliac arteries bilaterally.  Small supraumbilical ventral hernia containing the gallbladder, potentially containing tiny gallstones.   Electronically Signed   By: MLavonia DanaM.D.   On: 10/11/2014 16:54   Nm Pet Image Initial (pi) Skull Base To Thigh  11/01/2014   CLINICAL DATA:  Initial treatment strategy for right lung mass.  EXAM: NUCLEAR MEDICINE PET SKULL BASE TO THIGH  TECHNIQUE: 5.45 mCi F-18 FDG was injected  intravenously. Full-ring PET imaging was performed from the skull base to thigh after the radiotracer. CT data was obtained and used for attenuation correction and anatomic localization.  FASTING BLOOD GLUCOSE:  Value: 90 mg/dl  COMPARISON:  CT of the chest, abdomen and pelvis 10/11/2014.  FINDINGS: NECK  No hypermetabolic cervical lymph nodes are identified.There are no lesions of the pharyngeal mucosal space. There is minimal hypermetabolic activity within the left thyroid lobe (SUV max 2.9). This is of doubtful significance.  CHEST  The large spiculated right upper lobe mass is markedly hypermetabolic. This lesion measures 5.2 x 4.1 cm and has an SUV max of 19.0. No other hypermetabolic pulmonary lesions are identified. There is moderate diffuse emphysema. Within the right hilum,  there is low level hypermetabolic activity (SUV max 3.3) which may correspond with a small lymph node on prior CT. No hypermetabolic mediastinal lymph nodes are present.  ABDOMEN/PELVIS  There is no hypermetabolic activity within the liver, adrenal glands, spleen or pancreas. There is no hypermetabolic nodal activity. The gallbladder is herniated through a supraumbilical hernia as noted previously. There is probable diffuse gallbladder adenomyomatosis. 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  SKELETON  There is no hypermetabolic activity to suggest osseous metastatic disease.  IMPRESSION: 1. The large spiculated right upper lobe mass is hypermetabolic consistent with primary bronchogenic carcinoma. There is indeterminate low level activity within the right hilum which could reflect nodal metastasis. No mediastinal adenopathy or distant metastases identified. If the hilum is involved, this corresponds with stage IIB disease (T2bN1M0). 2. Minimal hypermetabolic activity in the left thyroid lobe, of doubtful significance. 3. Gallbladder remains herniated through a supraumbilical hernia.   Electronically Signed   By: Richardean Sale M.D.   On: 11/01/2014 13:14   Dg Chest Port 1 View  10/18/2014   CLINICAL DATA:  Status post bronchoscopy with biopsy. Rule out pneumothorax.  EXAM: PORTABLE CHEST - 1 VIEW  COMPARISON:  Chest CT 10/11/2014  FINDINGS: The heart is enlarged. Hyperinflated lungs. Bibasilar atelectasis. Again noted is right upper lobe mass as seen on previous CT exam. No pneumothorax identified following bronchoscopy. No pulmonary edema.  IMPRESSION: 1. No pneumothorax following bronchoscopy. 2. Persistent right upper lobe mass. 3. Hyperinflation.   Electronically Signed   By: Nolon Nations M.D.   On: 10/18/2014 09:14   Dg C-arm Bronchoscopy  10/18/2014   CLINICAL DATA:    C-ARM BRONCHOSCOPY  Fluoroscopy was utilized by the requesting physician.  No radiographic   interpretation.       IMPRESSION: Susan Garrett is a 80 year old woman presenting with stage T2bN1M0 squamous cell carcinoma of the right upper lung, stage IIB.  Discussed treatment options of surgery, radiation, and chemotherapy with the patient and patient's family.  PLAN: Today, I talked to the patient and family about the findings and work-up thus far.  We discussed the natural history of localized unresectable non-small cell lung cancer and general treatment, highlighting the role of radiotherapy in the management.  We discussed the available radiation techniques, and focused on the details of logistics and delivery.  We reviewed the anticipated acute and late sequelae associated with radiation in this setting.  The patient was encouraged to ask questions that I answered to the best of my ability.  I filled out a patient counseling form during our discussion including treatment diagrams.    If the patient is deemed unresectable by Dr. Cyndia Bent, we will get CT simulation scheduled. Radiation treatment coupled  with chemotherapy. Will await opinion from Dr. Cyndia Bent to discuss possible surgery.  I spent 60 minutes minutes face to face with the patient and more than 50% of that time was spent in counseling and/or coordination of care.   This document serves as a record of services personally performed by Tyler Pita, MD. It was created on his behalf by Lenn Cal, a trained medical scribe. The creation of this record is based on the scribe's personal observations and the provider's statements to them. This document has been checked and approved by the attending provider.     ------------------------------------------------  Sheral Apley Tammi Klippel, M.D.

## 2014-11-02 ENCOUNTER — Other Ambulatory Visit: Payer: Self-pay | Admitting: *Deleted

## 2014-11-02 ENCOUNTER — Encounter: Payer: Self-pay | Admitting: Surgery

## 2014-11-02 ENCOUNTER — Telehealth: Payer: Self-pay | Admitting: *Deleted

## 2014-11-02 ENCOUNTER — Other Ambulatory Visit (HOSPITAL_COMMUNITY)
Admission: RE | Admit: 2014-11-02 | Discharge: 2014-11-02 | Disposition: A | Discharge status: Home / Self Care | Payer: Medicare Other | Source: Ambulatory Visit | Attending: Internal Medicine | Admitting: Internal Medicine

## 2014-11-02 DIAGNOSIS — C3491 Malignant neoplasm of unspecified part of right bronchus or lung: Secondary | ICD-10-CM | POA: Insufficient documentation

## 2014-11-02 DIAGNOSIS — Z87891 Personal history of nicotine dependence: Secondary | ICD-10-CM | POA: Diagnosis not present

## 2014-11-02 NOTE — Progress Notes (Signed)
Cardiothoracic Surgery Consultation  PCP is [name], MD Referring Provider is Tanda Rockers, MD  No chief complaint on file.   HPI:  The patient is a 80 year old woman with a history of heavy smoking until recently who presented with dyspnea, generalized weakness and a 40 lb wt loss over the past year. She had a CT of the chest, abdomen and pelvis that showed a 5.8 x 4.4 x 3.6 cm RUL lung mass that was shown to be squamous cell carcinoma by bronchoscopic biopsy. A PET scan shows some low level hypermetabolism in the right hilum suspicious for metastatic disease. She is with her niece today who is an OB/GYN physician in [place]. She says that her aunt gets short of breath with ambulation and can only make it about 50 ft. She had spirometry on 10/12/2014 that showed an FEV1 of 0.5 ( 29% predicted) and and FVC of 2.3 (58% predicted).   Past Medical History  Diagnosis Date  . Hypertension   . Corns and callosities   . Callus   . Hyperthyroidism     Past Surgical History  Procedure Laterality Date  . Lesion removal Left 11/12/87  . Vaginal hysterectomy    . Video bronchoscopy Bilateral 10/18/2014    Procedure: VIDEO BRONCHOSCOPY WITH FLUORO;  Surgeon: Tanda Rockers, MD;  Location: [facility];  Service: Endoscopy;  Laterality: Bilateral;    Family History  Problem Relation Age of Onset  . Heart disease Father   . Asthma Father   . Cancer Mother     gastric  . Cancer Brother     lung    Social History History  Substance Use Topics  . Smoking status: Former Smoker -- 0.50 packs/day for 55 years    Types: Cigarettes    Quit date: 08/07/2014  . Smokeless tobacco: Never Used     Comment: smokes 0-4 cigs daily  . Alcohol Use: No    Current Outpatient Prescriptions  Medication Sig Dispense Refill  . ALPRAZolam (XANAX) 0.5 MG tablet Take 1 tab one hour before procedure, take second tab if needed 2 tablet 0  . aspirin 81 MG tablet Take 81 mg by mouth daily.    .  methylPREDNISolone (MEDROL DOSEPAK) 4 MG TBPK tablet Use as instructed 21 tablet 0  . metoprolol succinate (TOPROL-XL) 25 MG 24 hr tablet Take 25 mg by mouth daily.      No current facility-administered medications for this visit.    Allergies  Allergen Reactions  . Shellfish Allergy     Review of Systems  Constitutional: Positive for activity change, appetite change, fatigue and unexpected weight change.  Eyes: Negative.   Respiratory: Positive for cough and shortness of breath.   Cardiovascular: Negative for chest pain and palpitations.  Gastrointestinal: Negative.   Endocrine: Negative.   Genitourinary: Negative.   Musculoskeletal: Negative.   Skin: Negative.   Neurological: Negative.   Hematological: Negative.   Psychiatric/Behavioral: Negative.     There were no vitals taken for this visit. Physical Exam  Constitutional: She is oriented to person, place, and time.  Frail, elderly woman in no distress.  HENT:  Head: Normocephalic and atraumatic.  Eyes: EOM are normal. Pupils are equal, round, and reactive to light.  Neck: Normal range of motion. Neck supple. No JVD present. No thyromegaly present.  Cardiovascular: Normal rate, regular rhythm and normal heart sounds.   No murmur heard. Pulmonary/Chest: Effort normal. No respiratory distress. She has no wheezes.  Decreased breath  sounds throughout all lung fields  Abdominal: Soft. Bowel sounds are normal. She exhibits no distension and no mass. There is no tenderness.  Musculoskeletal: Normal range of motion. She exhibits no edema.  Lymphadenopathy:    She has no cervical adenopathy.  Neurological: She is alert and oriented to person, place, and time.  Skin: Skin is warm and dry.  Psychiatric: She has a normal mood and affect.     Diagnostic Tests:  CLINICAL DATA: Initial treatment strategy for right lung mass.  EXAM: NUCLEAR MEDICINE PET SKULL BASE TO THIGH  TECHNIQUE: 5.45 mCi F-18 FDG was injected  intravenously. Full-ring PET imaging was performed from the skull base to thigh after the radiotracer. CT data was obtained and used for attenuation correction and anatomic localization.  FASTING BLOOD GLUCOSE: Value: 90 mg/dl  COMPARISON: CT of the chest, abdomen and pelvis 10/11/2014.  FINDINGS: NECK  No hypermetabolic cervical lymph nodes are identified.There are no lesions of the pharyngeal mucosal space. There is minimal hypermetabolic activity within the left thyroid lobe (SUV max 2.9). This is of doubtful significance.  CHEST  The large spiculated right upper lobe mass is markedly hypermetabolic. This lesion measures 5.2 x 4.1 cm and has an SUV max of 19.0. No other hypermetabolic pulmonary lesions are identified. There is moderate diffuse emphysema. Within the right hilum, there is low level hypermetabolic activity (SUV max 3.3) which may correspond with a small lymph node on prior CT. No hypermetabolic mediastinal lymph nodes are present.  ABDOMEN/PELVIS  There is no hypermetabolic activity within the liver, adrenal glands, spleen or pancreas. There is no hypermetabolic nodal activity. The gallbladder is herniated through a supraumbilical hernia as noted previously. There is probable diffuse gallbladder adenomyomatosis. 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  SKELETON  There is no hypermetabolic activity to suggest osseous metastatic disease.  IMPRESSION: 1. The large spiculated right upper lobe mass is hypermetabolic consistent with primary bronchogenic carcinoma. There is indeterminate low level activity within the right hilum which could reflect nodal metastasis. No mediastinal adenopathy or distant metastases identified. If the hilum is involved, this corresponds with stage IIB disease (T2bN1M0). 2. Minimal hypermetabolic activity in the left thyroid lobe, of doubtful significance. 3. Gallbladder  remains herniated through a supraumbilical hernia.   Electronically Signed  By: Richardean Sale M.D.  On: 11/01/2014 13:14  Impression:  This frail, elderly woman with severe COPD has a clinical stage IIB (T2bN1M0) squamous cell lung cancer of the RUL. She is not an operative candidate due to the severity of her lung disease and her debilitated state with marked weight loss and inability to walk more than 50 ft. I would recommend proceeding with chemo/radiation per Dr. Tammi Klippel and Dr. Julien Nordmann. I reviewed her CT and PET scan with her and her niece and answered their questions. They are in agreement with non-surgical therapy.  Plan:  She will follow up with Dr. Tammi Klippel and Dr. Julien Nordmann.  Gaye Pollack, MD [facility] [phone]

## 2014-11-02 NOTE — Telephone Encounter (Signed)
Oncology Nurse Navigator Documentation  Oncology Nurse Navigator Flowsheets 11/02/2014  Navigator Encounter Type Other.  Care Coordination   Patient Visit Type Follow-up.  Called patient to follow up from thoracic clinic yesterday.  I updated her that her appt for PFT's were cancelled per Dr. Julien Nordmann.  Resp Care dept notified as well.  Patient was confused about other appt, I clarified.  She was thankful for the call.    Treatment Phase Staging   Barriers/Navigation Needs Clarification of appt  Education   Support Groups/Services   Time Spent with Patient 30

## 2014-11-06 ENCOUNTER — Encounter (HOSPITAL_COMMUNITY): Payer: Self-pay

## 2014-11-09 ENCOUNTER — Encounter: Payer: Self-pay | Admitting: *Deleted

## 2014-11-09 ENCOUNTER — Ambulatory Visit
Admission: RE | Admit: 2014-11-09 | Discharge: 2014-11-09 | Disposition: A | Discharge status: Home / Self Care | Payer: Medicare Other | Source: Ambulatory Visit | Attending: Radiation Oncology | Admitting: Radiation Oncology

## 2014-11-09 DIAGNOSIS — C3411 Malignant neoplasm of upper lobe, right bronchus or lung: Secondary | ICD-10-CM | POA: Diagnosis not present

## 2014-11-09 DIAGNOSIS — Z87891 Personal history of nicotine dependence: Secondary | ICD-10-CM | POA: Insufficient documentation

## 2014-11-09 DIAGNOSIS — C3491 Malignant neoplasm of unspecified part of right bronchus or lung: Secondary | ICD-10-CM

## 2014-11-09 NOTE — Progress Notes (Signed)
Oncology Nurse Navigator Documentation  Oncology Nurse Navigator Flowsheets 11/09/2014  Navigator Encounter Type Initial RadOnc  Patient Visit Type Radonc  Treatment Phase CT SIM/  Spoke with patient today at [facility].  She is doing well and is taking things one day at a time.  She have family with her.  No barriers nor concerns at this time   Barriers/Navigation Needs No barriers at this time  Education None  Support Groups/Services   Time Spent with Patient 15

## 2014-11-09 NOTE — Progress Notes (Signed)
  Radiation Oncology         [phone] ________________________________  Name: [name] MRN: [id number]  Date: 11/09/2014  DOB: [birth year]  SIMULATION AND TREATMENT PLANNING NOTE    ICD-9-CM ICD-10-CM   1. 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  NARRATIVE:  The patient was brought to the [facility].  Identity was confirmed.  All relevant records and images related to the planned course of therapy were reviewed.  The patient freely provided informed written consent to proceed with treatment after reviewing the details related to the planned course of therapy. The consent form was witnessed and verified by the simulation staff.  Then, the patient was set-up in a stable reproducible  supine position for radiation therapy.  CT images were obtained.  Surface markings were placed.  The CT images were loaded into the planning software.  Then the target and avoidance structures were contoured.  Treatment planning then occurred.  The radiation prescription was entered and confirmed.  Then, I designed and supervised the construction of a total of 5 medically necessary complex treatment devices consisting of 5 MLC apertures shaping radiation conformally around her tumors while shielding critical spinal cord and heart.  I have requested : 3D Simulation  I have requested a DVH of the following structures: left lung, right lung, heart, spinal cord, esophagus, and targets.  I have ordered:Nutrition Consult  SPECIAL TREATMENT PROCEDURE:  The planned course of therapy using radiation constitutes a special treatment procedure. Special care is required in the management of this patient for the following reasons. This treatment constitutes a Special Treatment Procedure for the following reason: Concurrent chemotherapy requiring careful monitoring for  increased toxicities of treatment including weekly laboratory values. The special nature of the planned course of radiotherapy will require increased physician supervision and oversight to ensure patient's safety with optimal treatment outcomes.  PLAN:  The patient will receive 66 Gy in 33 fraction.  This document serves as a record of services personally performed by Tyler Pita, MD. It was created on his behalf by Darcus Austin, a trained medical scribe. The creation of this record is based on the scribe's personal observations and the provider's statements to them. This document has been checked and approved by the attending provider.    ________________________________  Sheral Apley. Tammi Klippel, M.D.

## 2014-11-13 ENCOUNTER — Encounter: Payer: Self-pay | Admitting: Physician Assistant

## 2014-11-13 ENCOUNTER — Ambulatory Visit (HOSPITAL_BASED_OUTPATIENT_CLINIC_OR_DEPARTMENT_OTHER): Payer: Medicare Other | Admitting: Physician Assistant

## 2014-11-13 ENCOUNTER — Telehealth: Payer: Self-pay | Admitting: Internal Medicine

## 2014-11-13 VITALS — BP 154/69 | HR 91 | Temp 98.1°F | Resp 17 | Ht 64.0 in | Wt 107.9 lb

## 2014-11-13 DIAGNOSIS — Z5111 Encounter for antineoplastic chemotherapy: Secondary | ICD-10-CM

## 2014-11-13 DIAGNOSIS — C3411 Malignant neoplasm of upper lobe, right bronchus or lung: Secondary | ICD-10-CM | POA: Diagnosis not present

## 2014-11-13 DIAGNOSIS — C3491 Malignant neoplasm of unspecified part of right bronchus or lung: Secondary | ICD-10-CM

## 2014-11-13 HISTORY — DX: Encounter for antineoplastic chemotherapy: Z51.11

## 2014-11-13 MED ORDER — PROCHLORPERAZINE MALEATE 5 MG PO TABS: 5.0000 mg | ORAL_TABLET | Freq: Four times a day (QID) | ORAL | Status: DC | PRN

## 2014-11-13 NOTE — Telephone Encounter (Signed)
Gave and printed appt shced and avs for pt for June and July.Susan KitchenMarland KitchenMarland Garrett

## 2014-11-13 NOTE — Patient Instructions (Signed)
Proceed with your course of concurrent chemoradiation as scheduled Follow-up in 2 weeks for symptom management visit Take Compazine 5 mg by mouth every 6 hours as needed for nausea or vomiting

## 2014-11-13 NOTE — Progress Notes (Addendum)
No images are attached to the encounter. No scans are attached to the encounter. No scans are attached to the encounter. [facility] VISIT PROGRESS NOTE  [name], MD [name] [address]  DIAGNOSIS: Primary cancer of right lung   Staging form: Lung, AJCC 7th Edition     Clinical stage from 11/01/2014: Stage IIB (T2b, N1, M0) - Signed by Curt Bears, MD on 11/03/2014  PRIOR THERAPY: None  CURRENT THERAPY: Course of concurrent chemoradiation with chemotherapy in the form of weekly carboplatin for an AUC of 2 and paclitaxel 45 mg/m given concurrent with radiation for 6-7 weeks. First cycle expected 11/19/2014  INTERVAL HISTORY: Susan Garrett 80 y.o. Garrett returns for a scheduled regular office visit for followup of her recently diagnosed stage IIB non-small cell lung cancer, squamous cell carcinoma. She was evaluated by Dr. Cyndia Bent and found to not be a surgical candidate due to to her performance status and the extent of her disease. She was evaluated by Dr. Tammi Klippel on 11/09/2014 and PET CT simulation to proceed with a course of concurrent chemoradiation. She is to have port films done on 11/16/2014 with first fraction of radiation on 11/19/2014. She is also scheduled for a MRI of the brain to complete her staging workup on 11/16/2014. Today she voices no specific complaints. Her appetite is decreased but not any more than and has been of late. She reports that her primary care physician, Dr. Jeanann Lewandowsky increased her Toprol from 25 mg daily to 50 mg daily.  MEDICAL HISTORY: Past Medical History  Diagnosis Date  . Hypertension   . Corns and callosities   . Callus   . Hyperthyroidism     ALLERGIES:  is allergic to shellfish allergy.  MEDICATIONS:  Current Outpatient Prescriptions  Medication Sig Dispense Refill  . aspirin Susan MG tablet Take Susan mg by mouth daily.    . metoprolol succinate (TOPROL-XL) 25 MG 24 hr tablet  Take 25 mg by mouth daily.     [name] ALPRAZolam (XANAX) 0.5 MG tablet Take 1 tab one hour before procedure, take second tab if needed (Patient not taking: Reported on 11/13/2014) 2 tablet 0  . methylPREDNISolone (MEDROL DOSEPAK) 4 MG TBPK tablet Use as instructed (Patient not taking: Reported on 11/13/2014) 21 tablet 0   No current facility-administered medications for this visit.    SURGICAL HISTORY:  Past Surgical History  Procedure Laterality Date  . Lesion removal Left 11/12/87  . Vaginal hysterectomy    . Video bronchoscopy Bilateral 10/18/2014    Procedure: VIDEO BRONCHOSCOPY WITH FLUORO;  Surgeon: Tanda Rockers, MD;  Location: [facility];  Service: Endoscopy;  Laterality: Bilateral;    REVIEW OF SYSTEMS:  Review of Systems  Constitutional: Positive for weight loss and malaise/fatigue. Negative for fever, chills and diaphoresis.  HENT: Negative for congestion, ear discharge, ear pain, hearing loss, nosebleeds, sore throat and tinnitus.   Eyes: Negative for blurred vision, double vision, photophobia, pain, discharge and redness.  Respiratory: Positive for cough and shortness of breath. Negative for hemoptysis, sputum production, wheezing and stridor.   Cardiovascular: Negative for chest pain, palpitations, orthopnea, claudication, leg swelling and PND.  Gastrointestinal: Negative for heartburn, nausea, vomiting, abdominal pain, diarrhea, constipation, blood in stool and melena.  Genitourinary: Negative.   Musculoskeletal: Negative.        Positive for muscle weakness  Skin: Negative.   Neurological: Negative for dizziness, tingling, focal weakness, seizures, weakness and headaches.  Endo/Heme/Allergies: Does  not bruise/bleed easily.  Psychiatric/Behavioral: Negative for depression. The patient is not nervous/anxious and does not have insomnia.      PHYSICAL EXAMINATION: Physical Exam  Constitutional: She is oriented to person, place, and time and well-developed, well-nourished, and in  no distress.  HENT:  Head: Normocephalic and atraumatic.  Mouth/Throat: Oropharynx is clear and moist.  Eyes: Pupils are equal, round, and reactive to light.  Neck: Normal range of motion. Neck supple. No JVD present. No tracheal deviation present. No thyromegaly present.  Cardiovascular: Normal rate, regular rhythm, normal heart sounds and intact distal pulses.  Exam reveals no gallop and no friction rub.   No murmur heard. Pulmonary/Chest: Effort normal and breath sounds normal. No respiratory distress. She has no wheezes. She has no rales.  Abdominal: Soft. Bowel sounds are normal. She exhibits no distension and no mass. There is no tenderness.  Musculoskeletal: Normal range of motion. She exhibits edema. She exhibits no tenderness.  Patient comfortably seated in wheelchair, able to move upper and lower extremities independently and without hindrance  Trace to 1+ bilateral lower extremity edema. Patient has been in compression stockings for quite some time per the patient  Lymphadenopathy:    She has no cervical adenopathy.  Neurological: She is alert and oriented to person, place, and time. She has normal reflexes. Gait normal.  Skin: Skin is warm and dry. No rash noted.    ECOG PERFORMANCE STATUS: 1 - Symptomatic but completely ambulatory  Blood pressure 154/69, pulse 91, temperature 98.1 F (36.7 C), temperature source Oral, resp. rate 17, height _0  (1.626 m), weight 107 lb 14.4 oz (48.943 kg), SpO2 94 %.  LABORATORY DATA: Lab Results  Component Value Date   WBC 9.9 11/01/2014   HGB 16.7* 11/01/2014   HCT 52.9* 11/01/2014   MCV 86.7 11/01/2014   PLT 240 11/01/2014      Chemistry      Component Value Date/Time   NA 142 11/01/2014 1345   K 4.7 11/01/2014 1345   CO2 32* 11/01/2014 1345   BUN 13.2 11/01/2014 1345   CREATININE 1.0 11/01/2014 1345   CREATININE 1.00 10/11/2014 1616      Component Value Date/Time   CALCIUM 9.7 11/01/2014 1345   ALKPHOS 124 11/01/2014  1345   AST 27 11/01/2014 1345   ALT 27 11/01/2014 1345   BILITOT 1.67* 11/01/2014 1345       RADIOGRAPHIC STUDIES:  Nm Pet Image Initial (pi) Skull Base To Thigh  11/01/2014   CLINICAL DATA:  Initial treatment strategy for right lung mass.  EXAM: NUCLEAR MEDICINE PET SKULL BASE TO THIGH  TECHNIQUE: 5.45 mCi F-18 FDG was injected intravenously. Full-ring PET imaging was performed from the skull base to thigh after the radiotracer. CT data was obtained and used for attenuation correction and anatomic localization.  FASTING BLOOD GLUCOSE:  Value: 90 mg/dl  COMPARISON:  CT of the chest, abdomen and pelvis 10/11/2014.  FINDINGS: NECK  No hypermetabolic cervical lymph nodes are identified.There are no lesions of the pharyngeal mucosal space. There is minimal hypermetabolic activity within the left thyroid lobe (SUV max 2.9). This is of doubtful significance.  CHEST  The large spiculated right upper lobe mass is markedly hypermetabolic. This lesion measures 5.2 x 4.1 cm and has an SUV max of 19.0. No other hypermetabolic pulmonary lesions are identified. There is moderate diffuse emphysema. Within the right hilum, there is low level hypermetabolic activity (SUV max 3.3) which may correspond with a small lymph node on prior  CT. No hypermetabolic mediastinal lymph nodes are present.  ABDOMEN/PELVIS  There is no hypermetabolic activity within the liver, adrenal glands, spleen or pancreas. There is no hypermetabolic nodal activity. The gallbladder is herniated through a supraumbilical hernia as noted previously. There is probable diffuse gallbladder adenomyomatosis. 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  SKELETON  There is no hypermetabolic activity to suggest osseous metastatic disease.  IMPRESSION: 1. The large spiculated right upper lobe mass is hypermetabolic consistent with primary bronchogenic carcinoma. There is indeterminate low level activity within the right hilum  which could reflect nodal metastasis. No mediastinal adenopathy or distant metastases identified. If the hilum is involved, this corresponds with stage IIB disease (T2bN1M0). 2. Minimal hypermetabolic activity in the left thyroid lobe, of doubtful significance. 3. Gallbladder remains herniated through a supraumbilical hernia.   Electronically Signed   By: Richardean Sale M.D.   On: 11/01/2014 13:14   Dg Chest Port 1 View  10/18/2014   CLINICAL DATA:  Status post bronchoscopy with biopsy. Rule out pneumothorax.  EXAM: PORTABLE CHEST - 1 VIEW  COMPARISON:  Chest CT 10/11/2014  FINDINGS: The heart is enlarged. Hyperinflated lungs. Bibasilar atelectasis. Again noted is right upper lobe mass as seen on previous CT exam. No pneumothorax identified following bronchoscopy. No pulmonary edema.  IMPRESSION: 1. No pneumothorax following bronchoscopy. 2. Persistent right upper lobe mass. 3. Hyperinflation.   Electronically Signed   By: Nolon Nations M.D.   On: 10/18/2014 09:14   Dg C-arm Bronchoscopy  10/18/2014   CLINICAL DATA:    C-ARM BRONCHOSCOPY  Fluoroscopy was utilized by the requesting physician.  No radiographic  interpretation.      ASSESSMENT/PLAN:  No problem-specific assessment & plan notes found for this encounter.  The patient is a pleasant Susan Garrett recently diagnosed with stage IIB non-small cell lung cancer,invasive squamous cell carcinoma. Patient was evaluated by Dr. Cyndia Bent and was not a surgical candidate due to her performance status and extent of disease. She was evaluated by Dr. Tammi Klippel on 11/09/2014 for CT simulation. She is scheduled for port films on 11/16/2014 with first fraction of radiation expected on 11/19/2014. Patient was discussed with and also seen by Dr. Julien Nordmann. She will begin her course of concurrent  chemoradiation on 11/19/2014 with weekly labs consisting of a CBC differential and C met. We'll plan to see her in 2 weeks for symptom management  visit to see she tolerated her first cycle of concurrent chemoradiation. Prescription for Compazine 5 mg by mouth every 6 hours was sent her pharmacy of record via E scribed in the event that she has any nausea or vomiting. Dr. Julien Nordmann reviewed the expected side effects of this treatment which from the chemotherapy such should be minimal but she may experience some fatigue related to the radiation.  Awilda Metro E, PA-C 11/13/2014  All questions were answered. The patient knows to call the clinic with any problems, questions or concerns. We can certainly see the patient much sooner if necessary.  ADDENDUM: Hematology/Oncology Attending: I had a face to face encounter with the patient today. I recommended her care plan. This is a very pleasant 80 years old African-American Garrett with unresectable a stage IIb non-small cell lung cancer, squamous cell carcinoma. The patient came to the clinic today accompanied by her sister and niece. She is feeling fine. She was seen recently by Dr. Cyndia Bent and she was not found to be good surgical candidate for resection. She is here today  for evaluation and discussion of her treatment options. I had a lengthy discussion with the patient and her family about her current disease status and treatment options. I recommended for her a course of concurrent chemoradiation with weekly carboplatin for AUC of 2 and paclitaxel 45 MG/M2. I discussed with the patient and her family the adverse effect of this treatment including but not limited to alopecia, myelosuppression, nausea and vomiting, peripheral neuropathy, liver or renal dysfunction. She is expected to start the first cycle of her treatment on 11/19/2014. She would have a chemotherapy education class before starting the first dose of her treatment. The patient would come back for follow-up visit in 2 weeks for reevaluation and management of any adverse effect of her treatment. She was advised to call immediately if  she has any concerning symptoms in the interval.   Disclaimer: This note was dictated with voice recognition software. Similar sounding words can inadvertently be transcribed and may be missed upon review. Eilleen Kempf., MD 11/13/2014

## 2014-11-14 DIAGNOSIS — C3411 Malignant neoplasm of upper lobe, right bronchus or lung: Secondary | ICD-10-CM | POA: Diagnosis not present

## 2014-11-15 ENCOUNTER — Encounter (HOSPITAL_COMMUNITY): Payer: Medicare Other

## 2014-11-15 ENCOUNTER — Other Ambulatory Visit: Payer: Medicare Other

## 2014-11-15 ENCOUNTER — Encounter: Payer: Self-pay | Admitting: *Deleted

## 2014-11-16 ENCOUNTER — Ambulatory Visit
Admission: RE | Admit: 2014-11-16 | Discharge: 2014-11-16 | Disposition: A | Discharge status: Home / Self Care | Payer: Medicare Other | Source: Ambulatory Visit | Attending: Radiation Oncology | Admitting: Radiation Oncology

## 2014-11-16 ENCOUNTER — Ambulatory Visit (HOSPITAL_COMMUNITY)
Admission: RE | Admit: 2014-11-16 | Discharge: 2014-11-16 | Disposition: A | Discharge status: Home / Self Care | Payer: Medicare Other | Source: Ambulatory Visit | Attending: Internal Medicine | Admitting: Internal Medicine

## 2014-11-16 ENCOUNTER — Telehealth: Payer: Self-pay | Admitting: *Deleted

## 2014-11-16 DIAGNOSIS — I739 Peripheral vascular disease, unspecified: Secondary | ICD-10-CM | POA: Diagnosis not present

## 2014-11-16 DIAGNOSIS — C3491 Malignant neoplasm of unspecified part of right bronchus or lung: Secondary | ICD-10-CM | POA: Insufficient documentation

## 2014-11-16 DIAGNOSIS — C3411 Malignant neoplasm of upper lobe, right bronchus or lung: Secondary | ICD-10-CM | POA: Diagnosis not present

## 2014-11-16 MED ORDER — GADOBENATE DIMEGLUMINE 529 MG/ML IV SOLN
10.0000 mL | Freq: Once | INTRAVENOUS | Status: AC | PRN
  Administered 2014-11-16: 9 mL via INTRAVENOUS

## 2014-11-16 NOTE — Telephone Encounter (Signed)
R/s pt appts due to 1st time taxol. Made pt aware of changes; voices understanding.

## 2014-11-19 ENCOUNTER — Ambulatory Visit: Payer: Medicare Other

## 2014-11-19 ENCOUNTER — Ambulatory Visit (HOSPITAL_BASED_OUTPATIENT_CLINIC_OR_DEPARTMENT_OTHER): Payer: Medicare Other

## 2014-11-19 ENCOUNTER — Ambulatory Visit
Admission: RE | Admit: 2014-11-19 | Discharge: 2014-11-19 | Disposition: A | Discharge status: Home / Self Care | Payer: Medicare Other | Source: Ambulatory Visit | Attending: Radiation Oncology | Admitting: Radiation Oncology

## 2014-11-19 ENCOUNTER — Other Ambulatory Visit: Payer: Medicare Other

## 2014-11-19 ENCOUNTER — Other Ambulatory Visit (HOSPITAL_BASED_OUTPATIENT_CLINIC_OR_DEPARTMENT_OTHER): Payer: Medicare Other

## 2014-11-19 VITALS — BP 138/97 | HR 109 | Temp 97.3°F | Resp 18

## 2014-11-19 DIAGNOSIS — Z5111 Encounter for antineoplastic chemotherapy: Secondary | ICD-10-CM | POA: Diagnosis not present

## 2014-11-19 DIAGNOSIS — C3411 Malignant neoplasm of upper lobe, right bronchus or lung: Secondary | ICD-10-CM

## 2014-11-19 DIAGNOSIS — C3491 Malignant neoplasm of unspecified part of right bronchus or lung: Secondary | ICD-10-CM

## 2014-11-19 LAB — COMPREHENSIVE METABOLIC PANEL (CC13)
ALT: 26 U/L (ref 0–55)
ANION GAP: 11 meq/L (ref 3–11)
AST: 26 U/L (ref 5–34)
Albumin: 3.2 g/dL — ABNORMAL LOW (ref 3.5–5.0)
Alkaline Phosphatase: 118 U/L (ref 40–150)
BILIRUBIN TOTAL: 1.24 mg/dL — AB (ref 0.20–1.20)
BUN: 14.1 mg/dL (ref 7.0–26.0)
CO2: 29 mEq/L (ref 22–29)
CREATININE: 0.8 mg/dL (ref 0.6–1.1)
Calcium: 9.7 mg/dL (ref 8.4–10.4)
Chloride: 100 mEq/L (ref 98–109)
EGFR: 84 mL/min/{1.73_m2} — AB (ref >90)
GLUCOSE: 102 mg/dL (ref 70–140)
POTASSIUM: 4.3 meq/L (ref 3.5–5.1)
SODIUM: 140 meq/L (ref 136–145)
Total Protein: 6.6 g/dL (ref 6.4–8.3)

## 2014-11-19 LAB — CBC WITH DIFFERENTIAL/PLATELET
BASO%: 0.4 % (ref 0.0–2.0)
Basophils Absolute: 0 10*3/uL (ref 0.0–0.1)
EOS%: 1.2 % (ref 0.0–7.0)
Eosinophils Absolute: 0.1 10*3/uL (ref 0.0–0.5)
HEMATOCRIT: 52 % — AB (ref 34.8–46.6)
HGB: 16.4 g/dL — ABNORMAL HIGH (ref 11.6–15.9)
LYMPH%: 16.4 % (ref 14.0–49.7)
MCH: 26.9 pg (ref 25.1–34.0)
MCHC: 31.5 g/dL (ref 31.5–36.0)
MCV: 85.2 fL (ref 79.5–101.0)
MONO#: 1.1 10*3/uL — ABNORMAL HIGH (ref 0.1–0.9)
MONO%: 9.8 % (ref 0.0–14.0)
NEUT%: 72.2 % (ref 38.4–76.8)
NEUTROS ABS: 7.7 10*3/uL — AB (ref 1.5–6.5)
Platelets: 205 10*3/uL (ref 145–400)
RBC: 6.1 10*6/uL — ABNORMAL HIGH (ref 3.70–5.45)
RDW: 14.3 % (ref 11.2–14.5)
WBC: 10.7 10*3/uL — ABNORMAL HIGH (ref 3.9–10.3)
lymph#: 1.8 10*3/uL (ref 0.9–3.3)
nRBC: 0 % (ref 0–0)

## 2014-11-19 MED ORDER — PACLITAXEL CHEMO INJECTION 300 MG/50ML
45.0000 mg/m2 | Freq: Once | INTRAVENOUS | Status: AC
  Administered 2014-11-19: 66 mg via INTRAVENOUS
  Filled 2014-11-19: qty 11

## 2014-11-19 MED ORDER — SODIUM CHLORIDE 0.9 % IV SOLN
Freq: Once | INTRAVENOUS | Status: AC
  Administered 2014-11-19: 09:00:00 via INTRAVENOUS

## 2014-11-19 MED ORDER — FAMOTIDINE IN NACL 20-0.9 MG/50ML-% IV SOLN
INTRAVENOUS | Status: AC
  Filled 2014-11-19: qty 50

## 2014-11-19 MED ORDER — SODIUM CHLORIDE 0.9 % IV SOLN
121.6000 mg | Freq: Once | INTRAVENOUS | Status: AC
  Administered 2014-11-19: 120 mg via INTRAVENOUS
  Filled 2014-11-19: qty 12

## 2014-11-19 MED ORDER — FAMOTIDINE IN NACL 20-0.9 MG/50ML-% IV SOLN
20.0000 mg | Freq: Once | INTRAVENOUS | Status: AC
  Administered 2014-11-19: 20 mg via INTRAVENOUS

## 2014-11-19 MED ORDER — DIPHENHYDRAMINE HCL 50 MG/ML IJ SOLN
INTRAMUSCULAR | Status: AC
  Filled 2014-11-19: qty 1

## 2014-11-19 MED ORDER — DIPHENHYDRAMINE HCL 50 MG/ML IJ SOLN
50.0000 mg | Freq: Once | INTRAMUSCULAR | Status: AC
  Administered 2014-11-19: 50 mg via INTRAVENOUS

## 2014-11-19 MED ORDER — SODIUM CHLORIDE 0.9 % IV SOLN
Freq: Once | INTRAVENOUS | Status: AC
  Administered 2014-11-19: 09:00:00 via INTRAVENOUS
  Filled 2014-11-19: qty 8

## 2014-11-19 NOTE — Patient Instructions (Signed)
[facility] Discharge Instructions for Patients Receiving Chemotherapy  Today you received the following chemotherapy agents Paclitaxel/Carboplatin.  To help prevent nausea and vomiting after your treatment, we encourage you to take your nausea medication as directed.    If you develop nausea and vomiting that is not controlled by your nausea medication, call the clinic.   BELOW ARE SYMPTOMS THAT SHOULD BE REPORTED IMMEDIATELY:  *FEVER GREATER THAN 100.5 F  *CHILLS WITH OR WITHOUT FEVER  NAUSEA AND VOMITING THAT IS NOT CONTROLLED WITH YOUR NAUSEA MEDICATION  *UNUSUAL SHORTNESS OF BREATH  *UNUSUAL BRUISING OR BLEEDING  TENDERNESS IN MOUTH AND THROAT WITH OR WITHOUT PRESENCE OF ULCERS  *URINARY PROBLEMS  *BOWEL PROBLEMS  UNUSUAL RASH Items with * indicate a potential emergency and should be followed up as soon as possible.  Feel free to call the clinic you have any questions or concerns. The clinic phone number is [phone].  Please show the [facility] at check-in to the Emergency Department and triage nurse.

## 2014-11-19 NOTE — Progress Notes (Signed)
Ok to treat with bilirubin: 1.6 from 11/01/14 per [facility]

## 2014-11-20 ENCOUNTER — Ambulatory Visit
Admission: RE | Admit: 2014-11-20 | Discharge: 2014-11-20 | Disposition: A | Discharge status: Home / Self Care | Payer: Medicare Other | Source: Ambulatory Visit | Attending: Radiation Oncology | Admitting: Radiation Oncology

## 2014-11-20 DIAGNOSIS — C3411 Malignant neoplasm of upper lobe, right bronchus or lung: Secondary | ICD-10-CM | POA: Diagnosis not present

## 2014-11-20 NOTE — Progress Notes (Signed)
Received patient in the radiation nursing clinic following treatment. Patient questions if she can take magnesium citrate for constipation. Patient explains she hasn't had a bowel movement in two days. Patient reports using magnesium citrate in the past for the same reason. Explained this was fine but, once she had a bowel movement to begin taking colace once daily with a dose of Miralax to prevent constipation in the future. Patient verbalized understanding. Patient questions if she is safe to be around her family. Explained it is safe for her to be around her family members while receiving radiation but, to avoid those that are sick. She verbalized understanding.

## 2014-11-21 ENCOUNTER — Ambulatory Visit
Admission: RE | Admit: 2014-11-21 | Discharge: 2014-11-21 | Disposition: A | Discharge status: Home / Self Care | Payer: Medicare Other | Source: Ambulatory Visit | Attending: Radiation Oncology | Admitting: Radiation Oncology

## 2014-11-21 DIAGNOSIS — C3411 Malignant neoplasm of upper lobe, right bronchus or lung: Secondary | ICD-10-CM | POA: Diagnosis not present

## 2014-11-22 ENCOUNTER — Ambulatory Visit
Admission: RE | Admit: 2014-11-22 | Discharge: 2014-11-22 | Disposition: A | Discharge status: Home / Self Care | Payer: Medicare Other | Source: Ambulatory Visit | Attending: Radiation Oncology | Admitting: Radiation Oncology

## 2014-11-22 ENCOUNTER — Telehealth: Payer: Self-pay

## 2014-11-22 DIAGNOSIS — C3411 Malignant neoplasm of upper lobe, right bronchus or lung: Secondary | ICD-10-CM | POA: Diagnosis not present

## 2014-11-22 NOTE — Telephone Encounter (Signed)
Pt is not nauseated, is drinking about 3 glasses h20 per day, she took colace for some constipation. She is fatigued. She is on daily XRT. She thanked [place] for calling.

## 2014-11-22 NOTE — Telephone Encounter (Signed)
-----   Message from Ronnette Juniper, RN sent at 11/19/2014 11:59 AM EDT ----- Regarding: 1st treatment 1st: Paclitaxel/Caboplatin Mohamed  [phone] (H)

## 2014-11-23 ENCOUNTER — Ambulatory Visit
Admission: RE | Admit: 2014-11-23 | Discharge: 2014-11-23 | Disposition: A | Discharge status: Home / Self Care | Payer: Medicare Other | Source: Ambulatory Visit | Attending: Radiation Oncology | Admitting: Radiation Oncology

## 2014-11-23 ENCOUNTER — Encounter: Payer: Self-pay | Admitting: Radiation Oncology

## 2014-11-23 VITALS — BP 119/81 | HR 106 | Resp 16 | Wt 107.7 lb

## 2014-11-23 DIAGNOSIS — C3411 Malignant neoplasm of upper lobe, right bronchus or lung: Secondary | ICD-10-CM

## 2014-11-23 DIAGNOSIS — C3491 Malignant neoplasm of unspecified part of right bronchus or lung: Secondary | ICD-10-CM | POA: Insufficient documentation

## 2014-11-23 MED ORDER — RADIAPLEXRX EX GEL
Freq: Once | CUTANEOUS | Status: AC
  Administered 2014-11-23: 14:00:00 via TOPICAL

## 2014-11-23 NOTE — Progress Notes (Signed)
Vitals stable. Denies pain. Reports SOB with exertion. Denies cough. Denies difficulty or painful swallowing. Denies skin changes within treatment field. Denies headache, nausea, or vomiting. Reports taste changes. Reports dizziness. Decreased po intake. Constipation resolved. Reports nocturia last night. Denies dysuria or hematuria.   Oriented patient to staff and routine of the clinic. Provided patient with RADIATION THERAPY AND YOU handbook then, reviewed pertinent information. Educated patient reference potential side effects and management such as, fatigue, skin changes, and throat changes. Provided patient with constipation handout. Encouraged this patient to contact this RN with future needs and provided her with my business card. Provided patient with radiaplex and directed upon use. Patient verbalized understanding of all reviewed.   BP 119/81 mmHg  Pulse 106  Resp 16  Wt 107 lb 11.2 oz (48.852 kg) Wt Readings from Last 3 Encounters:  11/23/14 107 lb 11.2 oz (48.852 kg)  11/13/14 107 lb 14.4 oz (48.943 kg)  11/01/14 109 lb 8 oz (49.669 kg)

## 2014-11-23 NOTE — Addendum Note (Signed)
Encounter addended by: Tyler Pita, MD on: 11/23/2014  6:14 PM<BR>     Documentation filed: Problem List

## 2014-11-23 NOTE — Progress Notes (Signed)
  Radiation Oncology         [phone]   Name: Susan Garrett MRN: [id number]   Date: 11/23/2014  DOB: 1934-10-08    Weekly Radiation Therapy Management    ICD-9-CM ICD-10-CM   1. Primary cancer of right lung 162.9 C34.91 hyaluronate sodium (RADIAPLEXRX) gel    Current Dose: 10 Gy  Planned Dose:  66 Gy  Narrative The patient presents for routine under treatment assessment. Vitals stable. Denies pain. Reports SOB with exertion. Denies cough. Denies difficulty or painful swallowing. Denies skin changes within treatment field. Denies headache, nausea, or vomiting. Reports taste changes. Reports dizziness. Decreased po intake. Constipation resolved. Reports nocturia last night. Denies dysuria or hematuria. Reports fatigue. She is getting chemotherapy in medical-oncology every Monday.  Oriented patient to staff and routine of the clinic. Provided patient with RADIATION THERAPY AND YOU handbook then, reviewed pertinent information. Educated patient reference potential side effects and management such as, fatigue, skin changes, and throat changes. Provided patient with constipation handout. Encouraged this patient to contact this RN with future needs and provided her with my business card. Provided patient with radiaplex and directed upon use. Patient verbalized understanding of all reviewed.   Set-up films were reviewed. The chart was checked.  Physical Findings  weight is 107 lb 11.2 oz (48.852 kg). Her blood pressure is 119/81 and her pulse is 106. Her respiration is 16.  Weight essentially stable.  No significant changes.  Impression The patient is tolerating radiation.  Plan Continue treatment as planned. I will schedule the pt to see a dietician.    This document serves as a record of services personally performed by Tyler Pita, MD. It was created on his behalf by Darcus Austin, a trained medical scribe. The creation of this record is based on the scribe's personal observations and the  provider's statements to them. This document has been checked and approved by the attending provider.      Susan Garrett, M.D.

## 2014-11-26 ENCOUNTER — Other Ambulatory Visit (HOSPITAL_BASED_OUTPATIENT_CLINIC_OR_DEPARTMENT_OTHER): Payer: Medicare Other

## 2014-11-26 ENCOUNTER — Ambulatory Visit (HOSPITAL_BASED_OUTPATIENT_CLINIC_OR_DEPARTMENT_OTHER): Payer: Medicare Other | Admitting: Physician Assistant

## 2014-11-26 ENCOUNTER — Telehealth: Payer: Self-pay | Admitting: Physician Assistant

## 2014-11-26 ENCOUNTER — Ambulatory Visit
Admission: RE | Admit: 2014-11-26 | Discharge: 2014-11-26 | Disposition: A | Discharge status: Home / Self Care | Payer: Medicare Other | Source: Ambulatory Visit | Attending: Radiation Oncology | Admitting: Radiation Oncology

## 2014-11-26 ENCOUNTER — Ambulatory Visit (HOSPITAL_BASED_OUTPATIENT_CLINIC_OR_DEPARTMENT_OTHER): Payer: Medicare Other

## 2014-11-26 ENCOUNTER — Encounter: Payer: Self-pay | Admitting: Physician Assistant

## 2014-11-26 VITALS — BP 127/74 | HR 106 | Temp 98.4°F | Resp 16 | Wt 105.8 lb

## 2014-11-26 DIAGNOSIS — Z5111 Encounter for antineoplastic chemotherapy: Secondary | ICD-10-CM | POA: Diagnosis not present

## 2014-11-26 DIAGNOSIS — R350 Frequency of micturition: Secondary | ICD-10-CM

## 2014-11-26 DIAGNOSIS — C3491 Malignant neoplasm of unspecified part of right bronchus or lung: Secondary | ICD-10-CM

## 2014-11-26 DIAGNOSIS — I10 Essential (primary) hypertension: Secondary | ICD-10-CM | POA: Diagnosis not present

## 2014-11-26 DIAGNOSIS — C3411 Malignant neoplasm of upper lobe, right bronchus or lung: Secondary | ICD-10-CM

## 2014-11-26 LAB — URINALYSIS, MICROSCOPIC - CHCC
Bilirubin (Urine): NEGATIVE
Blood: NEGATIVE
GLUCOSE UR CHCC: NEGATIVE mg/dL
Ketones: NEGATIVE mg/dL
LEUKOCYTE ESTERASE: NEGATIVE
NITRITE: NEGATIVE
PH: 6 (ref 4.6–8.0)
Protein: NEGATIVE mg/dL
Specific Gravity, Urine: 1.02 (ref 1.003–1.035)
Urobilinogen, UR: 0.2 mg/dL (ref 0.2–1)

## 2014-11-26 LAB — CBC WITH DIFFERENTIAL/PLATELET
BASO%: 0.6 % (ref 0.0–2.0)
Basophils Absolute: 0 10*3/uL (ref 0.0–0.1)
EOS%: 1.9 % (ref 0.0–7.0)
Eosinophils Absolute: 0.1 10*3/uL (ref 0.0–0.5)
HEMATOCRIT: 46.5 % (ref 34.8–46.6)
HGB: 15.2 g/dL (ref 11.6–15.9)
LYMPH%: 19.9 % (ref 14.0–49.7)
MCH: 27.3 pg (ref 25.1–34.0)
MCHC: 32.7 g/dL (ref 31.5–36.0)
MCV: 83.5 fL (ref 79.5–101.0)
MONO#: 0.6 10*3/uL (ref 0.1–0.9)
MONO%: 8.2 % (ref 0.0–14.0)
NEUT#: 4.9 10*3/uL (ref 1.5–6.5)
NEUT%: 69.4 % (ref 38.4–76.8)
Platelets: 217 10*3/uL (ref 145–400)
RBC: 5.57 10*6/uL — ABNORMAL HIGH (ref 3.70–5.45)
RDW: 14 % (ref 11.2–14.5)
WBC: 7 10*3/uL (ref 3.9–10.3)
lymph#: 1.4 10*3/uL (ref 0.9–3.3)

## 2014-11-26 LAB — COMPREHENSIVE METABOLIC PANEL (CC13)
ALBUMIN: 3.3 g/dL — AB (ref 3.5–5.0)
ALT: 19 U/L (ref 0–55)
AST: 19 U/L (ref 5–34)
Alkaline Phosphatase: 102 U/L (ref 40–150)
Anion Gap: 8 mEq/L (ref 3–11)
BUN: 21.4 mg/dL (ref 7.0–26.0)
CALCIUM: 9.8 mg/dL (ref 8.4–10.4)
CHLORIDE: 99 meq/L (ref 98–109)
CO2: 31 mEq/L — ABNORMAL HIGH (ref 22–29)
Creatinine: 0.8 mg/dL (ref 0.6–1.1)
EGFR: 82 mL/min/{1.73_m2} — AB (ref >90)
GLUCOSE: 167 mg/dL — AB (ref 70–140)
POTASSIUM: 4.3 meq/L (ref 3.5–5.1)
Sodium: 138 mEq/L (ref 136–145)
Total Bilirubin: 1.09 mg/dL (ref 0.20–1.20)
Total Protein: 6.5 g/dL (ref 6.4–8.3)

## 2014-11-26 MED ORDER — SODIUM CHLORIDE 0.9 % IV SOLN
121.6000 mg | Freq: Once | INTRAVENOUS | Status: AC
  Administered 2014-11-26: 120 mg via INTRAVENOUS
  Filled 2014-11-26: qty 12

## 2014-11-26 MED ORDER — FAMOTIDINE IN NACL 20-0.9 MG/50ML-% IV SOLN
INTRAVENOUS | Status: AC
  Filled 2014-11-26: qty 50

## 2014-11-26 MED ORDER — FAMOTIDINE IN NACL 20-0.9 MG/50ML-% IV SOLN
20.0000 mg | Freq: Once | INTRAVENOUS | Status: AC
  Administered 2014-11-26: 20 mg via INTRAVENOUS

## 2014-11-26 MED ORDER — DIPHENHYDRAMINE HCL 50 MG/ML IJ SOLN
50.0000 mg | Freq: Once | INTRAMUSCULAR | Status: AC
Start: 2014-11-26 — End: 2014-11-26
  Administered 2014-11-26: 50 mg via INTRAVENOUS

## 2014-11-26 MED ORDER — PACLITAXEL CHEMO INJECTION 300 MG/50ML
45.0000 mg/m2 | Freq: Once | INTRAVENOUS | Status: AC
  Administered 2014-11-26: 66 mg via INTRAVENOUS
  Filled 2014-11-26: qty 11

## 2014-11-26 MED ORDER — SODIUM CHLORIDE 0.9 % IV SOLN
Freq: Once | INTRAVENOUS | Status: AC
  Administered 2014-11-26: 15:00:00 via INTRAVENOUS

## 2014-11-26 MED ORDER — DIPHENHYDRAMINE HCL 50 MG/ML IJ SOLN
INTRAMUSCULAR | Status: AC
  Filled 2014-11-26: qty 1

## 2014-11-26 MED ORDER — DEXAMETHASONE SODIUM PHOSPHATE 100 MG/10ML IJ SOLN
Freq: Once | INTRAMUSCULAR | Status: AC
  Administered 2014-11-26: 15:00:00 via INTRAVENOUS
  Filled 2014-11-26: qty 8

## 2014-11-26 NOTE — Patient Instructions (Addendum)
Continue your course of concurrent chemoradiation as scheduled Take the Medrol Dosepak as prescribed to help stimulate your appetite Follow-up in 2 weeks

## 2014-11-26 NOTE — Telephone Encounter (Signed)
Gave avs & calendar for July °

## 2014-11-26 NOTE — Addendum Note (Signed)
Encounter addended by: Heywood Footman, RN on: 11/26/2014  8:32 AM<BR>     Documentation filed: Visit Diagnoses

## 2014-11-26 NOTE — Progress Notes (Addendum)
No images are attached to the encounter. No scans are attached to the encounter. No scans are attached to the encounter. [facility] VISIT PROGRESS NOTE  [name], MD [address] [place] [address]  DIAGNOSIS: Primary cancer of right lung   Staging form: Lung, AJCC 7th Edition     Clinical stage from 11/01/2014: Stage IIB (T2b, N1, M0) - Signed by Curt Bears, MD on 11/03/2014  PRIOR THERAPY: None  CURRENT THERAPY: Course of concurrent chemoradiation with chemotherapy in the form of weekly carboplatin for an AUC of 2 and paclitaxel 45 mg/m given concurrent with radiation for 6-7 weeks. First cycle started 11/19/2014  INTERVAL HISTORY: Susan Garrett 80 y.o. female returns for a scheduled regular office visit for followup of her recently diagnosed stage IIB non-small cell lung cancer, squamous cell carcinoma. She completed one week of concurrent chemoradiation and presents today for reevaluation. She complains of not having an appetite. She also complains of achiness all over particularly in her shoulders and knees and she reports some episodes of urinary frequency beginning last Thursday. She denied specific fever or chills. She voiced no other specific complaints.  MEDICAL HISTORY: Past Medical History  Diagnosis Date  . Hypertension   . Corns and callosities   . Callus   . Hyperthyroidism   . Encounter for antineoplastic chemotherapy 11/13/2014  . Encounter for antineoplastic chemotherapy 11/13/2014    ALLERGIES:  is allergic to shellfish allergy.  MEDICATIONS:  Current Outpatient Prescriptions  Medication Sig Dispense Refill  . aspirin 81 MG tablet Take 81 mg by mouth daily.    . methylPREDNISolone (MEDROL DOSEPAK) 4 MG TBPK tablet Use as instructed 21 tablet 0  . metoprolol succinate (TOPROL-XL) 25 MG 24 hr tablet Take 25 mg by mouth daily.     . prochlorperazine (COMPAZINE) 5 MG tablet Take 1 tablet (5 mg total) by mouth every  6 (six) hours as needed for nausea or vomiting. 30 tablet 0  . Wound Cleansers (RADIAPLEX EX) Apply topically.    . ALPRAZolam (XANAX) 0.5 MG tablet Take 1 tab one hour before procedure, take second tab if needed (Patient not taking: Reported on 11/26/2014) 2 tablet 0  . methimazole (TAPAZOLE) 5 MG tablet      No current facility-administered medications for this visit.   Facility-Administered Medications Ordered in Other Visits  Medication Dose Route Frequency Provider Last Rate Last Dose  . CARBOplatin (PARAPLATIN) 120 mg in sodium chloride 0.9 % 100 mL chemo infusion  120 mg Intravenous Once Curt Bears, MD 224 mL/hr at 11/26/14 1640 120 mg at 11/26/14 1640    SURGICAL HISTORY:  Past Surgical History  Procedure Laterality Date  . Lesion removal Left 11/12/87  . Vaginal hysterectomy    . Video bronchoscopy Bilateral 10/18/2014    Procedure: VIDEO BRONCHOSCOPY WITH FLUORO;  Surgeon: Tanda Rockers, MD;  Location: [facility];  Service: Endoscopy;  Laterality: Bilateral;    REVIEW OF SYSTEMS:  Review of Systems  Constitutional: Positive for weight loss and malaise/fatigue. Negative for fever, chills and diaphoresis.  HENT: Negative for congestion, ear discharge, ear pain, hearing loss, nosebleeds, sore throat and tinnitus.   Eyes: Negative for blurred vision, double vision, photophobia, pain, discharge and redness.  Respiratory: Positive for cough and shortness of breath. Negative for hemoptysis, sputum production, wheezing and stridor.   Cardiovascular: Negative for chest pain, palpitations, orthopnea, claudication, leg swelling and PND.  Gastrointestinal: Negative for heartburn, nausea, vomiting, abdominal pain, diarrhea, constipation,  blood in stool and melena.  Genitourinary: Negative.   Musculoskeletal: Positive for myalgias and joint pain.       Positive for muscle weakness  Skin: Negative.   Neurological: Negative for dizziness, tingling, focal weakness, seizures, weakness and  headaches.  Endo/Heme/Allergies: Does not bruise/bleed easily.  Psychiatric/Behavioral: Negative for depression. The patient is not nervous/anxious and does not have insomnia.      PHYSICAL EXAMINATION: Physical Exam  Constitutional: She is oriented to person, place, and time and well-developed, well-nourished, and in no distress.  HENT:  Head: Normocephalic and atraumatic.  Mouth/Throat: Oropharynx is clear and moist.  Eyes: Pupils are equal, round, and reactive to light.  Neck: Normal range of motion. Neck supple. No JVD present. No tracheal deviation present. No thyromegaly present.  Cardiovascular: Normal rate, regular rhythm, normal heart sounds and intact distal pulses.  Exam reveals no gallop and no friction rub.   No murmur heard. Pulmonary/Chest: Effort normal and breath sounds normal. No respiratory distress. She has no wheezes. She has no rales.  Abdominal: Soft. Bowel sounds are normal. She exhibits no distension and no mass. There is no tenderness.  Musculoskeletal: Normal range of motion. She exhibits edema. She exhibits no tenderness.  Patient able to transfer from the exam room chair to the exam room table with minimal assistance, able to move upper and lower extremities independently and without hindrance  Trace to 1+ bilateral lower extremity edema. Patient has  compression stockings on  Lymphadenopathy:    She has no cervical adenopathy.  Neurological: She is alert and oriented to person, place, and time. She has normal reflexes. Gait normal.  Skin: Skin is warm and dry. No rash noted.    ECOG PERFORMANCE STATUS: 1 - Symptomatic but completely ambulatory  Blood pressure 127/74, pulse 106, temperature 98.4 F (36.9 C), resp. rate 16, weight 105 lb 12.8 oz (47.991 kg), SpO2 93 %.  LABORATORY DATA: Lab Results  Component Value Date   WBC 7.0 11/26/2014   HGB 15.2 11/26/2014   HCT 46.5 11/26/2014   MCV 83.5 11/26/2014   PLT 217 11/26/2014      Chemistry       Component Value Date/Time   NA 138 11/26/2014 1239   K 4.3 11/26/2014 1239   CO2 31* 11/26/2014 1239   BUN 21.4 11/26/2014 1239   CREATININE 0.8 11/26/2014 1239   CREATININE 1.00 10/11/2014 1616      Component Value Date/Time   CALCIUM 9.8 11/26/2014 1239   ALKPHOS 102 11/26/2014 1239   AST 19 11/26/2014 1239   ALT 19 11/26/2014 1239   BILITOT 1.09 11/26/2014 1239       RADIOGRAPHIC STUDIES:  Mr Jeri Cos Wo Contrast  11/16/2014   CLINICAL DATA:  80 year old female with stage IIB lung cancer. Staging. Subsequent encounter.  EXAM: MRI HEAD WITHOUT AND WITH CONTRAST  TECHNIQUE: Multiplanar, multiecho pulse sequences of the brain and surrounding structures were obtained without and with intravenous contrast.  CONTRAST:  28m MULTIHANCE GADOBENATE DIMEGLUMINE 529 MG/ML IV SOLN  COMPARISON:  None.  FINDINGS: No abnormal enhancement identified. No midline shift, mass effect, or evidence of intracranial mass lesion.  Cerebral volume is within normal limits for age. No restricted diffusion to suggest acute infarction. No ventriculomegaly, extra-axial collection or acute intracranial hemorrhage. Cervicomedullary junction and pituitary are within normal limits.  Numerous chronic micro hemorrhages scattered in the brain. Many are concentrated at the level of the deep gray matter nuclei (. Series 8, image 13) several lesions also  in the cerebellum (series 8, image 7). Otherwise mild for age nonspecific cerebral white matter T2 and FLAIR hyperintensity. There is generalized intracranial artery dolichoectasia. Major intracranial vascular flow voids are preserved.  Visible internal auditory structures appear normal. Trace left mastoid effusion on the left. Negative nasopharynx. Negative paranasal sinuses. Orbits soft tissues appear normal. Visualized scalp soft tissues are within normal limits.  Mildly heterogeneous bone marrow signal but no destructive or suspicious osseous lesion identified. Grossly negative  visualized cervical spinal cord.  IMPRESSION: 1.  No acute or metastatic intracranial abnormality. 2. Intracranial artery dolichoectasia and chronic small vessel disease, including numerous chronic micro hemorrhages in the brain.   Electronically Signed   By: Genevie Ann M.D.   On: 11/16/2014 14:10   Nm Pet Image Initial (pi) Skull Base To Thigh  11/01/2014   CLINICAL DATA:  Initial treatment strategy for right lung mass.  EXAM: NUCLEAR MEDICINE PET SKULL BASE TO THIGH  TECHNIQUE: 5.45 mCi F-18 FDG was injected intravenously. Full-ring PET imaging was performed from the skull base to thigh after the radiotracer. CT data was obtained and used for attenuation correction and anatomic localization.  FASTING BLOOD GLUCOSE:  Value: 90 mg/dl  COMPARISON:  CT of the chest, abdomen and pelvis 10/11/2014.  FINDINGS: NECK  No hypermetabolic cervical lymph nodes are identified.There are no lesions of the pharyngeal mucosal space. There is minimal hypermetabolic activity within the left thyroid lobe (SUV max 2.9). This is of doubtful significance.  CHEST  The large spiculated right upper lobe mass is markedly hypermetabolic. This lesion measures 5.2 x 4.1 cm and has an SUV max of 19.0. No other hypermetabolic pulmonary lesions are identified. There is moderate diffuse emphysema. Within the right hilum, there is low level hypermetabolic activity (SUV max 3.3) which may correspond with a small lymph node on prior CT. No hypermetabolic mediastinal lymph nodes are present.  ABDOMEN/PELVIS  There is no hypermetabolic activity within the liver, adrenal glands, spleen or pancreas. There is no hypermetabolic nodal activity. The gallbladder is herniated through a supraumbilical hernia as noted previously. There is probable diffuse gallbladder adenomyomatosis. 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  SKELETON  There is no hypermetabolic activity to suggest osseous metastatic disease.  IMPRESSION: 1.  The large spiculated right upper lobe mass is hypermetabolic consistent with primary bronchogenic carcinoma. There is indeterminate low level activity within the right hilum which could reflect nodal metastasis. No mediastinal adenopathy or distant metastases identified. If the hilum is involved, this corresponds with stage IIB disease (T2bN1M0). 2. Minimal hypermetabolic activity in the left thyroid lobe, of doubtful significance. 3. Gallbladder remains herniated through a supraumbilical hernia.   Electronically Signed   By: Richardean Sale M.D.   On: 11/01/2014 13:14     ASSESSMENT/PLAN:  No problem-specific assessment & plan notes found for this encounter.  The patient is a pleasant 80 year old African-American female recently diagnosed with stage IIB non-small cell lung cancer,invasive squamous cell carcinoma. Patient was evaluated by Dr. Cyndia Bent and was not a surgical candidate due to her performance status and extent of disease. She was evaluated by Dr. Tammi Klippel on 11/09/2014 for CT simulation. She was scheduled for port films on 11/16/2014 with first fraction of radiation given on 11/19/2014. Patient was discussed with and also seen by Dr. Julien Nordmann. She will continue her course of concurrent  chemoradiation as scheduled. After further evaluate her symptoms of urinary frequency we will obtain a clean catch urinalysis and urine culture and  sensitivity. It is doubtful her bone achiness is coming from either her weekly chemotherapy or radiation therapy and may be related to some degenerative joint disease. We'll have her continue to monitor her symptoms. Should her urine studies reveal infection, appropriate antibody therapy will be instituted. For appetite stimulation, a prescription for Medrol Dosepak was sent her pharmacy of record via E scribe. She'll follow-up in 2 weeks for reevaluation.  Awilda Metro E, PA-C 11/26/2014  All questions were answered. The patient knows to call the clinic with any  problems, questions or concerns. We can certainly see the patient much sooner if necessary.  ADDENDUM: Hematology/Oncology Attending: I had a face to face encounter with the patient. I recommended her care plan. This is a very pleasant 80 years old African-American female recently diagnosed with unresectable a stage IIb non-small cell lung cancer, squamous cell carcinoma. The patient is currently undergoing a course of concurrent chemoradiation with weekly carboplatin and paclitaxel is status post 1 week of treatment. She tolerated the first week of her treatment fairly well except for fatigue and arthroscopy in her shoulder and knees. I recommended for the patient to continue her treatment as scheduled. For the lack of appetite and arthralgia we started the patient on Medrol Dosepak. She will come back for follow-up visit in 2 weeks for reevaluation and management of any adverse effect of her treatment.  Disclaimer: This note was dictated with voice recognition software. Similar sounding words can inadvertently be transcribed and may be missed upon review. Eilleen Kempf., MD 11/27/2014

## 2014-11-26 NOTE — Patient Instructions (Signed)
[facility] Discharge Instructions for Patients Receiving Chemotherapy  Today you received the following chemotherapy agents Paclitaxel/Carboplatin.  To help prevent nausea and vomiting after your treatment, we encourage you to take your nausea medication as directed.    If you develop nausea and vomiting that is not controlled by your nausea medication, call the clinic.   BELOW ARE SYMPTOMS THAT SHOULD BE REPORTED IMMEDIATELY:  *FEVER GREATER THAN 100.5 F  *CHILLS WITH OR WITHOUT FEVER  NAUSEA AND VOMITING THAT IS NOT CONTROLLED WITH YOUR NAUSEA MEDICATION  *UNUSUAL SHORTNESS OF BREATH  *UNUSUAL BRUISING OR BLEEDING  TENDERNESS IN MOUTH AND THROAT WITH OR WITHOUT PRESENCE OF ULCERS  *URINARY PROBLEMS  *BOWEL PROBLEMS  UNUSUAL RASH Items with * indicate a potential emergency and should be followed up as soon as possible.  Feel free to call the clinic you have any questions or concerns. The clinic phone number is [phone].  Please show the [facility] at check-in to the Emergency Department and triage nurse.

## 2014-11-27 ENCOUNTER — Ambulatory Visit
Admission: RE | Admit: 2014-11-27 | Discharge: 2014-11-27 | Disposition: A | Discharge status: Home / Self Care | Payer: Medicare Other | Source: Ambulatory Visit | Attending: Radiation Oncology | Admitting: Radiation Oncology

## 2014-11-27 DIAGNOSIS — C3411 Malignant neoplasm of upper lobe, right bronchus or lung: Secondary | ICD-10-CM | POA: Diagnosis not present

## 2014-11-27 LAB — URINE CULTURE

## 2014-11-27 MED ORDER — METHYLPREDNISOLONE 4 MG PO TBPK: ORAL_TABLET | ORAL | Status: DC

## 2014-11-28 ENCOUNTER — Ambulatory Visit
Admission: RE | Admit: 2014-11-28 | Discharge: 2014-11-28 | Disposition: A | Discharge status: Home / Self Care | Payer: Medicare Other | Source: Ambulatory Visit | Attending: Radiation Oncology | Admitting: Radiation Oncology

## 2014-11-28 DIAGNOSIS — C3411 Malignant neoplasm of upper lobe, right bronchus or lung: Secondary | ICD-10-CM | POA: Diagnosis not present

## 2014-11-29 ENCOUNTER — Ambulatory Visit
Admission: RE | Admit: 2014-11-29 | Discharge: 2014-11-29 | Disposition: A | Discharge status: Home / Self Care | Payer: Medicare Other | Source: Ambulatory Visit | Attending: Radiation Oncology | Admitting: Radiation Oncology

## 2014-11-29 DIAGNOSIS — C3411 Malignant neoplasm of upper lobe, right bronchus or lung: Secondary | ICD-10-CM | POA: Diagnosis not present

## 2014-11-30 ENCOUNTER — Ambulatory Visit
Admission: RE | Admit: 2014-11-30 | Discharge: 2014-11-30 | Disposition: A | Discharge status: Home / Self Care | Payer: Medicare Other | Source: Ambulatory Visit | Attending: Radiation Oncology | Admitting: Radiation Oncology

## 2014-11-30 ENCOUNTER — Encounter: Payer: Self-pay | Admitting: Radiation Oncology

## 2014-11-30 VITALS — BP 124/73 | HR 110 | Temp 97.8°F | Resp 20 | Wt 106.1 lb

## 2014-11-30 DIAGNOSIS — C3411 Malignant neoplasm of upper lobe, right bronchus or lung: Secondary | ICD-10-CM | POA: Diagnosis not present

## 2014-11-30 NOTE — Progress Notes (Signed)
Weekly rad tx right lung, very fatigued, no skin intact, radiaplex used daily,   By Wed after tx feels sick as a dog, head feels like a tight  Band around head, and numbness in right leg, no head ache stated or nausea, very constipation, has miralax ,  Last BM 3 days ago, appetite good,  BP 124/73 mmHg  Pulse 110  Temp(Src) 97.8 F (36.6 C) (Oral)  Resp 20  Wt 106 lb 1.6 oz (48.127 kg)  SpO2 95%  Wt Readings from Last 3 Encounters:  11/30/14 106 lb 1.6 oz (48.127 kg)  11/26/14 105 lb 12.8 oz (47.991 kg)  11/23/14 107 lb 11.2 oz (48.852 kg)

## 2014-11-30 NOTE — Progress Notes (Signed)
   Department of Radiation Oncology  Phone:  [phone] Fax:        [phone]  Weekly Treatment Note    Name: Susan Garrett Date: 11/30/2014 MRN: [id number] DOB: 05-21-1935   Current dose: 20 Gy  Current fraction: 10   MEDICATIONS: Current Outpatient Prescriptions  Medication Sig Dispense Refill  . aspirin 81 MG tablet Take 81 mg by mouth daily.    . methylPREDNISolone (MEDROL DOSEPAK) 4 MG TBPK tablet Use as instructed. Take with food 21 tablet 0  . metoprolol succinate (TOPROL-XL) 25 MG 24 hr tablet Take 25 mg by mouth daily.     . Wound Cleansers (RADIAPLEX EX) Apply topically.    . ALPRAZolam (XANAX) 0.5 MG tablet Take 1 tab one hour before procedure, take second tab if needed (Patient not taking: Reported on 11/26/2014) 2 tablet 0  . methimazole (TAPAZOLE) 5 MG tablet     . prochlorperazine (COMPAZINE) 5 MG tablet Take 1 tablet (5 mg total) by mouth every 6 (six) hours as needed for nausea or vomiting. (Patient not taking: Reported on 11/30/2014) 30 tablet 0   No current facility-administered medications for this encounter.     ALLERGIES: Shellfish allergy   LABORATORY DATA:  Lab Results  Component Value Date   WBC 7.0 11/26/2014   HGB 15.2 11/26/2014   HCT 46.5 11/26/2014   MCV 83.5 11/26/2014   PLT 217 11/26/2014   Lab Results  Component Value Date   NA 138 11/26/2014   K 4.3 11/26/2014   CO2 31* 11/26/2014   Lab Results  Component Value Date   ALT 19 11/26/2014   AST 19 11/26/2014   ALKPHOS 102 11/26/2014   BILITOT 1.09 11/26/2014     NARRATIVE: Susan Garrett was seen today for weekly treatment management. The chart was checked and the patient's films were reviewed.   Weekly rad tx right lung, very fatigued, no skin intact, radiaplex used daily,   By Wed after tx feels sick as a dog, head feels like a tight  Band around head, and numbness in right leg, no head ache stated or nausea, very constipation, has miralax ,  Last BM 3 days ago,  appetite good,  BP 124/73 mmHg  Pulse 110  Temp(Src) 97.8 F (36.6 C) (Oral)  Resp 20  Wt 106 lb 1.6 oz (48.127 kg)  SpO2 95%  Wt Readings from Last 3 Encounters:  11/30/14 106 lb 1.6 oz (48.127 kg)  11/26/14 105 lb 12.8 oz (47.991 kg)  11/23/14 107 lb 11.2 oz (48.852 kg)    PHYSICAL EXAMINATION: weight is 106 lb 1.6 oz (48.127 kg). Her oral temperature is 97.8 F (36.6 C). Her blood pressure is 124/73 and her pulse is 110. Her respiration is 20 and oxygen saturation is 95%.        ASSESSMENT: The patient is doing satisfactorily with treatment.  PLAN: We will continue with the patient's radiation treatment as planned. I believe the patient is doing fine. She describes some tightness across her head but does not really feel that this represented a severe headache. This is better today. The patient did have a recent negative MRI scan which I discussed with her. She will let us know if this issue worsens.

## 2014-12-03 ENCOUNTER — Telehealth: Payer: Self-pay | Admitting: Nurse Practitioner

## 2014-12-03 ENCOUNTER — Other Ambulatory Visit: Payer: Medicare Other

## 2014-12-03 ENCOUNTER — Other Ambulatory Visit: Payer: Self-pay

## 2014-12-03 ENCOUNTER — Ambulatory Visit (HOSPITAL_BASED_OUTPATIENT_CLINIC_OR_DEPARTMENT_OTHER): Payer: Medicare Other

## 2014-12-03 ENCOUNTER — Encounter: Payer: Medicare Other | Admitting: Nurse Practitioner

## 2014-12-03 ENCOUNTER — Ambulatory Visit (HOSPITAL_BASED_OUTPATIENT_CLINIC_OR_DEPARTMENT_OTHER)
Admission: RE | Admit: 2014-12-03 | Discharge: 2014-12-03 | Disposition: A | Discharge status: Home / Self Care | Payer: Medicare Other | Source: Ambulatory Visit | Attending: Radiation Oncology | Admitting: Radiation Oncology

## 2014-12-03 VITALS — BP 127/81 | HR 106 | Temp 98.2°F

## 2014-12-03 DIAGNOSIS — C3491 Malignant neoplasm of unspecified part of right bronchus or lung: Secondary | ICD-10-CM

## 2014-12-03 DIAGNOSIS — C3411 Malignant neoplasm of upper lobe, right bronchus or lung: Secondary | ICD-10-CM | POA: Diagnosis not present

## 2014-12-03 DIAGNOSIS — Z5111 Encounter for antineoplastic chemotherapy: Secondary | ICD-10-CM

## 2014-12-03 LAB — CBC WITH DIFFERENTIAL/PLATELET
BASO%: 1 % (ref 0.0–2.0)
Basophils Absolute: 0 10*3/uL (ref 0.0–0.1)
EOS%: 0.2 % (ref 0.0–7.0)
Eosinophils Absolute: 0 10*3/uL (ref 0.0–0.5)
HCT: 47.6 % — ABNORMAL HIGH (ref 34.8–46.6)
HGB: 15.1 g/dL (ref 11.6–15.9)
LYMPH%: 11.6 % — AB (ref 14.0–49.7)
MCH: 26.5 pg (ref 25.1–34.0)
MCHC: 31.8 g/dL (ref 31.5–36.0)
MCV: 83.4 fL (ref 79.5–101.0)
MONO#: 0.3 10*3/uL (ref 0.1–0.9)
MONO%: 6.9 % (ref 0.0–14.0)
NEUT#: 3.5 10*3/uL (ref 1.5–6.5)
NEUT%: 80.3 % — ABNORMAL HIGH (ref 38.4–76.8)
PLATELETS: 260 10*3/uL (ref 145–400)
RBC: 5.71 10*6/uL — AB (ref 3.70–5.45)
RDW: 14.1 % (ref 11.2–14.5)
WBC: 4.3 10*3/uL (ref 3.9–10.3)
lymph#: 0.5 10*3/uL — ABNORMAL LOW (ref 0.9–3.3)

## 2014-12-03 LAB — COMPREHENSIVE METABOLIC PANEL (CC13)
ALK PHOS: 88 U/L (ref 40–150)
ALT: 24 U/L (ref 0–55)
AST: 20 U/L (ref 5–34)
Albumin: 3.5 g/dL (ref 3.5–5.0)
Anion Gap: 9 mEq/L (ref 3–11)
BILIRUBIN TOTAL: 1 mg/dL (ref 0.20–1.20)
BUN: 25.2 mg/dL (ref 7.0–26.0)
CO2: 28 meq/L (ref 22–29)
Calcium: 9.3 mg/dL (ref 8.4–10.4)
Chloride: 98 mEq/L (ref 98–109)
Creatinine: 1 mg/dL (ref 0.6–1.1)
EGFR: 64 mL/min/{1.73_m2} — ABNORMAL LOW (ref >90)
GLUCOSE: 205 mg/dL — AB (ref 70–140)
Potassium: 3.9 mEq/L (ref 3.5–5.1)
Sodium: 135 mEq/L — ABNORMAL LOW (ref 136–145)
TOTAL PROTEIN: 6.5 g/dL (ref 6.4–8.3)

## 2014-12-03 MED ORDER — PACLITAXEL CHEMO INJECTION 300 MG/50ML
45.0000 mg/m2 | Freq: Once | INTRAVENOUS | Status: AC
  Administered 2014-12-03: 66 mg via INTRAVENOUS
  Filled 2014-12-03: qty 11

## 2014-12-03 MED ORDER — DIPHENHYDRAMINE HCL 50 MG/ML IJ SOLN
50.0000 mg | Freq: Once | INTRAMUSCULAR | Status: AC
  Administered 2014-12-03: 50 mg via INTRAVENOUS

## 2014-12-03 MED ORDER — FAMOTIDINE IN NACL 20-0.9 MG/50ML-% IV SOLN
INTRAVENOUS | Status: AC
  Filled 2014-12-03: qty 50

## 2014-12-03 MED ORDER — SODIUM CHLORIDE 0.9 % IV SOLN
Freq: Once | INTRAVENOUS | Status: AC
  Administered 2014-12-03: 14:00:00 via INTRAVENOUS
  Filled 2014-12-03: qty 8

## 2014-12-03 MED ORDER — DIPHENHYDRAMINE HCL 50 MG/ML IJ SOLN
INTRAMUSCULAR | Status: AC
  Filled 2014-12-03: qty 1

## 2014-12-03 MED ORDER — SODIUM CHLORIDE 0.9 % IV SOLN
Freq: Once | INTRAVENOUS | Status: AC
  Administered 2014-12-03: 14:00:00 via INTRAVENOUS

## 2014-12-03 MED ORDER — FAMOTIDINE IN NACL 20-0.9 MG/50ML-% IV SOLN
20.0000 mg | Freq: Once | INTRAVENOUS | Status: AC
  Administered 2014-12-03: 20 mg via INTRAVENOUS

## 2014-12-03 MED ORDER — SODIUM CHLORIDE 0.9 % IV SOLN
120.0000 mg | Freq: Once | INTRAVENOUS | Status: AC
  Administered 2014-12-03: 120 mg via INTRAVENOUS
  Filled 2014-12-03: qty 12

## 2014-12-03 NOTE — Telephone Encounter (Signed)
Pt's sister cancelled Nut/[employer] states she doesn't need it.... KJ

## 2014-12-03 NOTE — Patient Instructions (Signed)
[facility] Discharge Instructions for Patients Receiving Chemotherapy  Today you received the following chemotherapy agents: Taxol and carboplatin.  To help prevent nausea and vomiting after your treatment, we encourage you to take your nausea medication: compazine 5 mg every 6 hours as needed.   If you develop nausea and vomiting that is not controlled by your nausea medication, call the clinic.   BELOW ARE SYMPTOMS THAT SHOULD BE REPORTED IMMEDIATELY:  *FEVER GREATER THAN 100.5 F  *CHILLS WITH OR WITHOUT FEVER  NAUSEA AND VOMITING THAT IS NOT CONTROLLED WITH YOUR NAUSEA MEDICATION  *UNUSUAL SHORTNESS OF BREATH  *UNUSUAL BRUISING OR BLEEDING  TENDERNESS IN MOUTH AND THROAT WITH OR WITHOUT PRESENCE OF ULCERS  *URINARY PROBLEMS  *BOWEL PROBLEMS  UNUSUAL RASH Items with * indicate a potential emergency and should be followed up as soon as possible.  Feel free to call the clinic you have any questions or concerns. The clinic phone number is [phone].  Please show the [facility] at check-in to the Emergency Department and triage nurse.

## 2014-12-04 ENCOUNTER — Encounter: Payer: Medicare Other | Admitting: Nutrition

## 2014-12-04 ENCOUNTER — Ambulatory Visit
Admission: RE | Admit: 2014-12-04 | Discharge: 2014-12-04 | Disposition: A | Discharge status: Home / Self Care | Payer: Medicare Other | Source: Ambulatory Visit | Attending: Radiation Oncology | Admitting: Radiation Oncology

## 2014-12-04 DIAGNOSIS — C3411 Malignant neoplasm of upper lobe, right bronchus or lung: Secondary | ICD-10-CM | POA: Diagnosis not present

## 2014-12-05 ENCOUNTER — Ambulatory Visit
Admission: RE | Admit: 2014-12-05 | Discharge: 2014-12-05 | Disposition: A | Discharge status: Home / Self Care | Payer: Medicare Other | Source: Ambulatory Visit | Attending: Radiation Oncology | Admitting: Radiation Oncology

## 2014-12-05 DIAGNOSIS — C3411 Malignant neoplasm of upper lobe, right bronchus or lung: Secondary | ICD-10-CM | POA: Diagnosis not present

## 2014-12-06 ENCOUNTER — Ambulatory Visit
Admission: RE | Admit: 2014-12-06 | Discharge: 2014-12-06 | Disposition: A | Discharge status: Home / Self Care | Payer: Medicare Other | Source: Ambulatory Visit | Attending: Radiation Oncology | Admitting: Radiation Oncology

## 2014-12-06 ENCOUNTER — Encounter: Payer: Self-pay | Admitting: Radiation Oncology

## 2014-12-06 ENCOUNTER — Encounter: Payer: Self-pay | Admitting: *Deleted

## 2014-12-06 VITALS — BP 118/71 | HR 99 | Resp 16 | Wt 105.3 lb

## 2014-12-06 DIAGNOSIS — C3411 Malignant neoplasm of upper lobe, right bronchus or lung: Secondary | ICD-10-CM

## 2014-12-06 NOTE — Progress Notes (Addendum)
Weight and vitals stable. Denies pain. Reports SOB with mild exertion. Denies cough. Denies difficulty or painful swallowing. No skin changes noted within treatment field. Reports using radiaplex bid as directed. Reports taste changes continues. Denies headache, dizziness, nausea, vomiting, or diarrhea. Reports constipation has resolved. Reports difficulty sleeping. Reports fatigue. Noted that patient cancelled dietician appointment for 6/28. Questioned if patient would like to reschedule and she declined. Reports her PCP, Dr. Carlis Abbott, is working to resolve her thyroid issues that are contributing to her weight loss.  BP 118/71 mmHg  Pulse 99  Resp 16  Wt 105 lb 4.8 oz (47.764 kg) Wt Readings from Last 3 Encounters:  12/06/14 105 lb 4.8 oz (47.764 kg)  11/30/14 106 lb 1.6 oz (48.127 kg)  11/26/14 105 lb 12.8 oz (47.991 kg)

## 2014-12-06 NOTE — Progress Notes (Signed)
[facility] Psychosocial Distress Screening Clinical Social Work  Clinical Social Work was referred by distress screening protocol.  The patient scored a 6 on the Psychosocial Distress Thermometer which indicates moderate distress. Clinical Social Worker contacted patient by phone to assess for distress and other psychosocial needs. Susan Garrett shared she is doing well, but is experiencing a lot of fatigue through treatment.  She lives in a tri-level home and has difficulty walking up and down stairs.  CSW and patient discussed ways to maximize patient's energy and ensure safety.  Susan Garrett shared she receives great support from her sister specifically and also her three nieces.  The patient shared she is working to [employer] and her sister cooks her many different foods.  CSW encouraged patient to utilize dietitian services, patient does not feel that is necessary at this time because she doesn't want to "overhwhelm her sister".  CSW encouraged patient to call with any questions or concerns.  ON[facility]N DISTRESS SCREENING 11/13/2014  Screening Type Initial Screening  Distress experienced in past week (1-10) 6  Emotional problem type Nervousness/Anxiety;Adjusting to illness;Adjusting to appearance changes  Physical Problem type Breathing;Constipation/diarrhea  Physician notified of physical symptoms Yes    Clinical Social Worker follow up needed: No.  If yes, follow up plan:  Polo Riley, MSW, LCSW, OSW-C Clinical Social Worker [facility] [phone]

## 2014-12-06 NOTE — Progress Notes (Signed)
Weekly Management Note:  Site: Right lung Current Dose:  2800  cGy Projected Dose: 6600  cGy  Narrative: The patient is seen today for routine under treatment assessment. CBCT/MVCT images/port films were reviewed. The chart was reviewed.   She is generally feeling well today.  She denies dyspnea.  She also denies cough, or sulfa gyrus/pain on swallowing.  Her constipation has resolved.  Her weight remained stable.  Her taste does remain altered.  Physical Examination:  Filed Vitals:   12/06/14 1344  BP: 118/71  Pulse: 99  Resp: 16  .  Weight: 105 lb 4.8 oz (47.764 kg).  Lungs are clear.  There is faint hyperpigmentation of the skin along the right chest.  No areas of desquamation.  Laboratory data: Lab Results  Component Value Date   WBC 4.3 12/03/2014   HGB 15.1 12/03/2014   HCT 47.6* 12/03/2014   MCV 83.4 12/03/2014   PLT 260 12/03/2014     Impression: Tolerating radiation therapy well.  Plan: Continue radiation therapy as planned.

## 2014-12-06 NOTE — Progress Notes (Signed)
Please see dictated note from earlier today for weekly management.

## 2014-12-07 ENCOUNTER — Ambulatory Visit
Admission: RE | Admit: 2014-12-07 | Discharge: 2014-12-07 | Disposition: A | Discharge status: Home / Self Care | Payer: Medicare Other | Source: Ambulatory Visit | Attending: Radiation Oncology | Admitting: Radiation Oncology

## 2014-12-07 DIAGNOSIS — C3411 Malignant neoplasm of upper lobe, right bronchus or lung: Secondary | ICD-10-CM | POA: Diagnosis not present

## 2014-12-11 ENCOUNTER — Telehealth: Payer: Self-pay | Admitting: Internal Medicine

## 2014-12-11 ENCOUNTER — Other Ambulatory Visit (HOSPITAL_BASED_OUTPATIENT_CLINIC_OR_DEPARTMENT_OTHER): Payer: Medicare Other

## 2014-12-11 ENCOUNTER — Ambulatory Visit
Admission: RE | Admit: 2014-12-11 | Discharge: 2014-12-11 | Disposition: A | Discharge status: Home / Self Care | Payer: Medicare Other | Source: Ambulatory Visit | Attending: Radiation Oncology | Admitting: Radiation Oncology

## 2014-12-11 ENCOUNTER — Ambulatory Visit (HOSPITAL_BASED_OUTPATIENT_CLINIC_OR_DEPARTMENT_OTHER): Payer: Medicare Other

## 2014-12-11 ENCOUNTER — Ambulatory Visit (HOSPITAL_BASED_OUTPATIENT_CLINIC_OR_DEPARTMENT_OTHER): Payer: Medicare Other | Admitting: Nurse Practitioner

## 2014-12-11 VITALS — BP 133/74 | HR 110 | Temp 98.3°F | Resp 18 | Wt 103.5 lb

## 2014-12-11 DIAGNOSIS — C3491 Malignant neoplasm of unspecified part of right bronchus or lung: Secondary | ICD-10-CM

## 2014-12-11 DIAGNOSIS — Z5111 Encounter for antineoplastic chemotherapy: Secondary | ICD-10-CM | POA: Diagnosis not present

## 2014-12-11 DIAGNOSIS — R630 Anorexia: Secondary | ICD-10-CM

## 2014-12-11 DIAGNOSIS — R112 Nausea with vomiting, unspecified: Secondary | ICD-10-CM | POA: Diagnosis not present

## 2014-12-11 DIAGNOSIS — C3411 Malignant neoplasm of upper lobe, right bronchus or lung: Secondary | ICD-10-CM

## 2014-12-11 DIAGNOSIS — R0789 Other chest pain: Secondary | ICD-10-CM | POA: Diagnosis not present

## 2014-12-11 LAB — CBC WITH DIFFERENTIAL/PLATELET
BASO%: 0.6 % (ref 0.0–2.0)
Basophils Absolute: 0 10*3/uL (ref 0.0–0.1)
EOS ABS: 0 10*3/uL (ref 0.0–0.5)
EOS%: 0 % (ref 0.0–7.0)
HCT: 46.9 % — ABNORMAL HIGH (ref 34.8–46.6)
HEMOGLOBIN: 14.7 g/dL (ref 11.6–15.9)
LYMPH%: 18.9 % (ref 14.0–49.7)
MCH: 26.6 pg (ref 25.1–34.0)
MCHC: 31.3 g/dL — ABNORMAL LOW (ref 31.5–36.0)
MCV: 84.8 fL (ref 79.5–101.0)
MONO#: 0.5 10*3/uL (ref 0.1–0.9)
MONO%: 9.4 % (ref 0.0–14.0)
NEUT%: 71.1 % (ref 38.4–76.8)
NEUTROS ABS: 3.5 10*3/uL (ref 1.5–6.5)
Platelets: 184 10*3/uL (ref 145–400)
RBC: 5.53 10*6/uL — ABNORMAL HIGH (ref 3.70–5.45)
RDW: 14.8 % — AB (ref 11.2–14.5)
WBC: 4.9 10*3/uL (ref 3.9–10.3)
lymph#: 0.9 10*3/uL (ref 0.9–3.3)

## 2014-12-11 LAB — COMPREHENSIVE METABOLIC PANEL (CC13)
ALT: 38 U/L (ref 0–55)
ANION GAP: 8 meq/L (ref 3–11)
AST: 31 U/L (ref 5–34)
Albumin: 3.4 g/dL — ABNORMAL LOW (ref 3.5–5.0)
Alkaline Phosphatase: 91 U/L (ref 40–150)
BUN: 13 mg/dL (ref 7.0–26.0)
CALCIUM: 9.8 mg/dL (ref 8.4–10.4)
CO2: 27 meq/L (ref 22–29)
CREATININE: 0.7 mg/dL (ref 0.6–1.1)
Chloride: 101 mEq/L (ref 98–109)
EGFR: >90 mL/min/{1.73_m2} (ref >90)
Glucose: 154 mg/dl — ABNORMAL HIGH (ref 70–140)
Potassium: 4.1 mEq/L (ref 3.5–5.1)
Sodium: 137 mEq/L (ref 136–145)
Total Bilirubin: 0.56 mg/dL (ref 0.20–1.20)
Total Protein: 6.3 g/dL — ABNORMAL LOW (ref 6.4–8.3)

## 2014-12-11 MED ORDER — DIPHENHYDRAMINE HCL 50 MG/ML IJ SOLN
INTRAMUSCULAR | Status: AC
  Filled 2014-12-11: qty 1

## 2014-12-11 MED ORDER — SODIUM CHLORIDE 0.9 % IV SOLN
Freq: Once | INTRAVENOUS | Status: AC
  Administered 2014-12-11: 14:00:00 via INTRAVENOUS

## 2014-12-11 MED ORDER — FAMOTIDINE IN NACL 20-0.9 MG/50ML-% IV SOLN
20.0000 mg | Freq: Once | INTRAVENOUS | Status: AC
  Administered 2014-12-11: 20 mg via INTRAVENOUS

## 2014-12-11 MED ORDER — DEXTROSE 5 % IV SOLN
45.0000 mg/m2 | Freq: Once | INTRAVENOUS | Status: AC
  Administered 2014-12-11: 66 mg via INTRAVENOUS
  Filled 2014-12-11: qty 11

## 2014-12-11 MED ORDER — FAMOTIDINE IN NACL 20-0.9 MG/50ML-% IV SOLN
INTRAVENOUS | Status: AC
  Filled 2014-12-11: qty 50

## 2014-12-11 MED ORDER — SODIUM CHLORIDE 0.9 % IV SOLN
121.6000 mg | Freq: Once | INTRAVENOUS | Status: AC
  Administered 2014-12-11: 120 mg via INTRAVENOUS
  Filled 2014-12-11: qty 12

## 2014-12-11 MED ORDER — DIPHENHYDRAMINE HCL 50 MG/ML IJ SOLN
50.0000 mg | Freq: Once | INTRAMUSCULAR | Status: AC
  Administered 2014-12-11: 50 mg via INTRAVENOUS

## 2014-12-11 MED ORDER — SODIUM CHLORIDE 0.9 % IV SOLN
Freq: Once | INTRAVENOUS | Status: AC
  Administered 2014-12-11: 14:00:00 via INTRAVENOUS
  Filled 2014-12-11: qty 8

## 2014-12-11 NOTE — Telephone Encounter (Signed)
Gave adn printed appt sched and avs for pt for July

## 2014-12-11 NOTE — Patient Instructions (Signed)
[facility] Discharge Instructions for Patients Receiving Chemotherapy  Today you received the following chemotherapy agents Taxol and Carboplatin. To help prevent nausea and vomiting after your treatment, we encourage you to take your nausea medication as directed.  If you develop nausea and vomiting that is not controlled by your nausea medication, call the clinic.   BELOW ARE SYMPTOMS THAT SHOULD BE REPORTED IMMEDIATELY:  *FEVER GREATER THAN 100.5 F  *CHILLS WITH OR WITHOUT FEVER  NAUSEA AND VOMITING THAT IS NOT CONTROLLED WITH YOUR NAUSEA MEDICATION  *UNUSUAL SHORTNESS OF BREATH  *UNUSUAL BRUISING OR BLEEDING  TENDERNESS IN MOUTH AND THROAT WITH OR WITHOUT PRESENCE OF ULCERS  *URINARY PROBLEMS  *BOWEL PROBLEMS  UNUSUAL RASH Items with * indicate a potential emergency and should be followed up as soon as possible.  Feel free to call the clinic you have any questions or concerns. The clinic phone number is [phone].  Please show the CHEMO ALERT CARD at check-in to the Emergency Department and triage nurse.    

## 2014-12-11 NOTE — Progress Notes (Signed)
  [place] OFFICE PROGRESS NOTE   DIAGNOSIS: Primary cancer of right lung  Staging form: Lung, AJCC 7th Edition  Clinical stage from 11/01/2014: Stage IIB (T2b, N1, M0) - Signed by Curt Bears, MD on 11/03/2014  PRIOR THERAPY: None  CURRENT THERAPY: Course of concurrent chemoradiation with chemotherapy in the form of weekly carboplatin for an AUC of 2 and paclitaxel 45 mg/m given concurrent with radiation for 6-7 weeks. First cycle started 11/19/2014    INTERVAL HISTORY:   Susan Garrett returns as scheduled. She continues radiation and weekly Taxol/carboplatin. She has had a single episode of nausea/vomiting since beginning treatment. No mouth sores. She has noted constipation. Bowels moving regularly since beginning miralax. She notes hair loss. She has an intermittent burning sensation at the left chest. This is relieved with "burping". No rash. She denies shortness of breath. Appetite is poor. She noted no improvement in appetite with steroids. She notes an alteration in taste. She is becoming fatigued.  Objective:  Vital signs in last 24 hours:  Blood pressure 133/74, pulse 110, temperature 98.3 F (36.8 C), temperature source Oral, resp. rate 18, weight 103 lb 8 oz (46.947 kg), SpO2 94 %. repeat heart rate 104    HEENT: No thrush or ulcers. Lymphatics: No palpable cervical or supra clavicular lymph nodes. Resp: Lungs clear. Cardio: Regular rate and rhythm. GI: Abdomen soft and nontender. No hepatomegaly. Vascular: No leg edema. Calves soft and nontender. Neuro: Alert and oriented. Moves all extremity.  Skin: No rash.    Lab Results:  Lab Results  Component Value Date   WBC 4.9 12/11/2014   HGB 14.7 12/11/2014   HCT 46.9* 12/11/2014   MCV 84.8 12/11/2014   PLT 184 12/11/2014   NEUTROABS 3.5 12/11/2014    Imaging:  No results found.  Medications: I have reviewed the patient's current medications.  Assessment/Plan: 1. Unresectable stage IIb  non-small cell lung cancer, squamous cell carcinoma. Currently undergoing concurrent chemoradiation with weekly carboplatin and paclitaxel. 2. Anorexia/taste alteration/weight loss. She declines an appetite stimulant. She declines a referral to the [facility] dietitian. 3. "Burning" sensation left chest, occurs intermittently, relieved with "burping". Question esophagitis.   Disposition: Susan Garrett continues radiation and concurrent weekly carboplatin/Taxol. She is experiencing anorexia/taste alteration/weight loss. She is not interested in an appetite stimulant or meeting with the [place] dietitian. The "burning" sensation at the left chest may be radiation related esophagitis. She will discuss further with radiation oncology.  We will see her in follow-up in one week. She will contact the office in the interim with any problems.  Plan reviewed with Dr. Julien Nordmann.    Ned Card ANP/GNP-BC   12/11/2014  1:11 PM

## 2014-12-11 NOTE — Progress Notes (Signed)
Okay to treat today with HR: 110 per Dr. Julien Nordmann.

## 2014-12-12 ENCOUNTER — Ambulatory Visit
Admission: RE | Admit: 2014-12-12 | Discharge: 2014-12-12 | Disposition: A | Discharge status: Home / Self Care | Payer: Medicare Other | Source: Ambulatory Visit | Attending: Radiation Oncology | Admitting: Radiation Oncology

## 2014-12-12 DIAGNOSIS — C3411 Malignant neoplasm of upper lobe, right bronchus or lung: Secondary | ICD-10-CM | POA: Diagnosis not present

## 2014-12-13 ENCOUNTER — Ambulatory Visit
Admission: RE | Admit: 2014-12-13 | Discharge: 2014-12-13 | Disposition: A | Discharge status: Home / Self Care | Payer: Medicare Other | Source: Ambulatory Visit | Attending: Radiation Oncology | Admitting: Radiation Oncology

## 2014-12-13 DIAGNOSIS — C3411 Malignant neoplasm of upper lobe, right bronchus or lung: Secondary | ICD-10-CM | POA: Diagnosis not present

## 2014-12-14 ENCOUNTER — Ambulatory Visit
Admission: RE | Admit: 2014-12-14 | Discharge: 2014-12-14 | Disposition: A | Discharge status: Home / Self Care | Payer: Medicare Other | Source: Ambulatory Visit | Attending: Radiation Oncology | Admitting: Radiation Oncology

## 2014-12-14 VITALS — BP 137/81 | HR 80 | Temp 97.8°F | Resp 20 | Wt 105.6 lb

## 2014-12-14 DIAGNOSIS — C3411 Malignant neoplasm of upper lobe, right bronchus or lung: Secondary | ICD-10-CM

## 2014-12-14 NOTE — Progress Notes (Signed)
  Radiation Oncology         [phone]   Name: Susan Garrett MRN: [id number]   Date: 12/14/2014  DOB: [birth year]    Weekly Radiation Therapy Management    ICD-9-CM ICD-10-CM   1. Primary cancer of right upper lobe of lung 162.3 C34.11     Current Dose: 38 Gy  Planned Dose:  66 Gy  Narrative The patient presents for routine under treatment assessment. Completed 19 of 33 treatments. Denies pain, but does have increased weakness and fatigue. Pt states she has shortness of breath greater on exertion. The pt states that her taste buds are "horrible." She presents to the clinic in a wheelchair and is accompanied with her niece. Vitals stable. Set-up films were reviewed. The chart was checked.  Physical Findings  weight is 105 lb 9.6 oz (47.9 kg). Her temperature is 97.8 F (36.6 C). Her blood pressure is 137/81 and her pulse is 80. Her respiration is 20 and oxygen saturation is 98%.  4 lbs of weight loss is noted since late May, 2016.  No significant changes.  Impression The patient is tolerating radiation.  Plan Continue treatment as planned.    This document serves as a record of services personally performed by Tyler Pita, MD. It was created on his behalf by Darcus Austin, a trained medical scribe. The creation of this record is based on the scribe's personal observations and the provider's statements to them. This document has been checked and approved by the attending provider.      Sheral Apley Tammi Klippel, M.D.

## 2014-12-14 NOTE — Progress Notes (Signed)
Patient for weekly assessment of radiation to right lung.Completed 19 of 33 treatments.Denies pain but does have increased weakness and fatigue.Shortness of breath greater on exertion.

## 2014-12-17 ENCOUNTER — Other Ambulatory Visit: Payer: Medicare Other

## 2014-12-17 ENCOUNTER — Ambulatory Visit (HOSPITAL_BASED_OUTPATIENT_CLINIC_OR_DEPARTMENT_OTHER): Payer: Medicare Other | Admitting: Physician Assistant

## 2014-12-17 ENCOUNTER — Other Ambulatory Visit (HOSPITAL_BASED_OUTPATIENT_CLINIC_OR_DEPARTMENT_OTHER): Payer: Medicare Other

## 2014-12-17 ENCOUNTER — Ambulatory Visit (HOSPITAL_BASED_OUTPATIENT_CLINIC_OR_DEPARTMENT_OTHER): Payer: Medicare Other

## 2014-12-17 ENCOUNTER — Ambulatory Visit
Admission: RE | Admit: 2014-12-17 | Discharge: 2014-12-17 | Disposition: A | Discharge status: Home / Self Care | Payer: Medicare Other | Source: Ambulatory Visit | Attending: Radiation Oncology | Admitting: Radiation Oncology

## 2014-12-17 ENCOUNTER — Telehealth: Payer: Self-pay | Admitting: Physician Assistant

## 2014-12-17 ENCOUNTER — Encounter: Payer: Self-pay | Admitting: Physician Assistant

## 2014-12-17 VITALS — BP 132/75 | HR 111 | Temp 98.4°F | Resp 18 | Ht 64.0 in | Wt 102.0 lb

## 2014-12-17 DIAGNOSIS — C3411 Malignant neoplasm of upper lobe, right bronchus or lung: Secondary | ICD-10-CM

## 2014-12-17 DIAGNOSIS — Z5111 Encounter for antineoplastic chemotherapy: Secondary | ICD-10-CM

## 2014-12-17 DIAGNOSIS — C3491 Malignant neoplasm of unspecified part of right bronchus or lung: Secondary | ICD-10-CM

## 2014-12-17 LAB — CBC WITH DIFFERENTIAL/PLATELET
BASO%: 1.2 % (ref 0.0–2.0)
Basophils Absolute: 0.1 10*3/uL (ref 0.0–0.1)
EOS ABS: 0 10*3/uL (ref 0.0–0.5)
EOS%: 0.4 % (ref 0.0–7.0)
HCT: 48.2 % — ABNORMAL HIGH (ref 34.8–46.6)
HEMOGLOBIN: 15.3 g/dL (ref 11.6–15.9)
LYMPH%: 18.8 % (ref 14.0–49.7)
MCH: 26.7 pg (ref 25.1–34.0)
MCHC: 31.7 g/dL (ref 31.5–36.0)
MCV: 84.1 fL (ref 79.5–101.0)
MONO#: 0.3 10*3/uL (ref 0.1–0.9)
MONO%: 7.1 % (ref 0.0–14.0)
NEUT#: 2.9 10*3/uL (ref 1.5–6.5)
NEUT%: 72.5 % (ref 38.4–76.8)
Platelets: 128 10*3/uL — ABNORMAL LOW (ref 145–400)
RBC: 5.73 10*6/uL — AB (ref 3.70–5.45)
RDW: 15.1 % — ABNORMAL HIGH (ref 11.2–14.5)
WBC: 4 10*3/uL (ref 3.9–10.3)
lymph#: 0.8 10*3/uL — ABNORMAL LOW (ref 0.9–3.3)

## 2014-12-17 LAB — COMPREHENSIVE METABOLIC PANEL (CC13)
ALBUMIN: 3.4 g/dL — AB (ref 3.5–5.0)
ALT: 36 U/L (ref 0–55)
AST: 28 U/L (ref 5–34)
Alkaline Phosphatase: 100 U/L (ref 40–150)
Anion Gap: 5 mEq/L (ref 3–11)
BILIRUBIN TOTAL: 1.12 mg/dL (ref 0.20–1.20)
BUN: 17.7 mg/dL (ref 7.0–26.0)
CALCIUM: 9.9 mg/dL (ref 8.4–10.4)
CO2: 30 mEq/L — ABNORMAL HIGH (ref 22–29)
Chloride: 102 mEq/L (ref 98–109)
Creatinine: 0.8 mg/dL (ref 0.6–1.1)
EGFR: 85 mL/min/{1.73_m2} — ABNORMAL LOW (ref >90)
Glucose: 138 mg/dl (ref 70–140)
POTASSIUM: 4.2 meq/L (ref 3.5–5.1)
Sodium: 136 mEq/L (ref 136–145)
TOTAL PROTEIN: 6.4 g/dL (ref 6.4–8.3)

## 2014-12-17 MED ORDER — DIPHENHYDRAMINE HCL 50 MG/ML IJ SOLN
50.0000 mg | Freq: Once | INTRAMUSCULAR | Status: AC
  Administered 2014-12-17: 50 mg via INTRAVENOUS

## 2014-12-17 MED ORDER — SODIUM CHLORIDE 0.9 % IV SOLN
121.6000 mg | Freq: Once | INTRAVENOUS | Status: AC
  Administered 2014-12-17: 120 mg via INTRAVENOUS
  Filled 2014-12-17: qty 12

## 2014-12-17 MED ORDER — FAMOTIDINE IN NACL 20-0.9 MG/50ML-% IV SOLN
20.0000 mg | Freq: Once | INTRAVENOUS | Status: AC
  Administered 2014-12-17: 20 mg via INTRAVENOUS

## 2014-12-17 MED ORDER — SODIUM CHLORIDE 0.9 % IV SOLN
Freq: Once | INTRAVENOUS | Status: AC
  Administered 2014-12-17: 14:00:00 via INTRAVENOUS

## 2014-12-17 MED ORDER — DIPHENHYDRAMINE HCL 50 MG/ML IJ SOLN
INTRAMUSCULAR | Status: AC
  Filled 2014-12-17: qty 1

## 2014-12-17 MED ORDER — FAMOTIDINE IN NACL 20-0.9 MG/50ML-% IV SOLN
INTRAVENOUS | Status: AC
  Filled 2014-12-17: qty 50

## 2014-12-17 MED ORDER — DEXAMETHASONE SODIUM PHOSPHATE 100 MG/10ML IJ SOLN
Freq: Once | INTRAMUSCULAR | Status: AC
  Administered 2014-12-17: 14:00:00 via INTRAVENOUS
  Filled 2014-12-17: qty 8

## 2014-12-17 MED ORDER — PACLITAXEL CHEMO INJECTION 300 MG/50ML
45.0000 mg/m2 | Freq: Once | INTRAVENOUS | Status: AC
  Administered 2014-12-17: 66 mg via INTRAVENOUS
  Filled 2014-12-17: qty 11

## 2014-12-17 NOTE — Telephone Encounter (Signed)
per pof to sch pt appt-gave pt copy of sch °

## 2014-12-17 NOTE — Patient Instructions (Signed)
Continue your course of concurrent chemoradiation as scheduled Followup in 2 weeks 

## 2014-12-17 NOTE — Progress Notes (Signed)
No images are attached to the encounter. No scans are attached to the encounter. No scans are attached to the encounter. [place] VISIT PROGRESS NOTE  [name], MD [address] [place] [id number]  DIAGNOSIS: Primary cancer of right lung   Staging form: Lung, AJCC 7th Edition     Clinical stage from 11/01/2014: Stage IIB (T2b, N1, M0) - Signed by Curt Bears, MD on 11/03/2014  PRIOR THERAPY: None  CURRENT THERAPY: Course of concurrent chemoradiation with chemotherapy in the form of weekly carboplatin for an AUC of 2 and paclitaxel 45 mg/m given concurrent with radiation for 6-7 weeks. First cycle started 11/19/2014  INTERVAL HISTORY: Susan Garrett 80 y.o. female returns for a scheduled regular office visit for followup of her recently diagnosed stage IIB non-small cell lung cancer, squamous cell carcinoma. She is tolerating her course of concurrent chemoradiation relatively well. She states her by mouth intake has increased but she is disappointed to see that she is lost a little over a pound since she was last in our office. The scale in our office weighs differently than the scale in radiation oncology in this advanced to her frustration. She reports feeling weak occasionally but has not sustained any falls or near falls. She does have some occasional nausea that is well-controlled with her current antiemetics. She had one episode of vomiting after drinking warm prune juice but no further episodes of vomiting. She denied specific fever or chills. She voiced no other specific complaints.  MEDICAL HISTORY: Past Medical History  Diagnosis Date  . Hypertension   . Corns and callosities   . Callus   . Hyperthyroidism   . Encounter for antineoplastic chemotherapy 11/13/2014  . Encounter for antineoplastic chemotherapy 11/13/2014    ALLERGIES:  is allergic to shellfish allergy.  MEDICATIONS:  Current Outpatient Prescriptions  Medication  Sig Dispense Refill  . ALPRAZolam (XANAX) 0.5 MG tablet Take 1 tab one hour before procedure, take second tab if needed 2 tablet 0  . aspirin 81 MG tablet Take 81 mg by mouth daily.    . methimazole (TAPAZOLE) 5 MG tablet     . methylPREDNISolone (MEDROL DOSEPAK) 4 MG TBPK tablet Use as instructed. Take with food 21 tablet 0  . metoprolol succinate (TOPROL-XL) 25 MG 24 hr tablet Take 25 mg by mouth daily.     . prochlorperazine (COMPAZINE) 5 MG tablet Take 1 tablet (5 mg total) by mouth every 6 (six) hours as needed for nausea or vomiting. 30 tablet 0  . Wound Cleansers (RADIAPLEX EX) Apply topically.     No current facility-administered medications for this visit.    SURGICAL HISTORY:  Past Surgical History  Procedure Laterality Date  . Lesion removal Left 11/12/87  . Vaginal hysterectomy    . Video bronchoscopy Bilateral 10/18/2014    Procedure: VIDEO BRONCHOSCOPY WITH FLUORO;  Surgeon: Tanda Rockers, MD;  Location: [facility];  Service: Endoscopy;  Laterality: Bilateral;    REVIEW OF SYSTEMS:  Review of Systems  Constitutional: Positive for weight loss and malaise/fatigue. Negative for fever, chills and diaphoresis.  HENT: Negative for congestion, ear discharge, ear pain, hearing loss, nosebleeds, sore throat and tinnitus.        Reports hair loss  Eyes: Negative for blurred vision, double vision, photophobia, pain, discharge and redness.  Respiratory: Positive for shortness of breath. Negative for hemoptysis, sputum production, wheezing and stridor.   Cardiovascular: Negative for chest pain, palpitations, orthopnea, claudication, leg swelling  and PND.  Gastrointestinal: Negative for heartburn, nausea, vomiting, abdominal pain, diarrhea, constipation, blood in stool and melena.  Genitourinary: Negative.   Musculoskeletal:       Positive for muscle weakness  Skin: Negative.   Neurological: Negative for dizziness, tingling, focal weakness, seizures, weakness and headaches.   Endo/Heme/Allergies: Does not bruise/bleed easily.  Psychiatric/Behavioral: Negative for depression. The patient is not nervous/anxious and does not have insomnia.      PHYSICAL EXAMINATION: Physical Exam  Constitutional: She is oriented to person, place, and time and well-developed, well-nourished, and in no distress.  HENT:  Head: Normocephalic and atraumatic.  Mouth/Throat: Oropharynx is clear and moist.  Eyes: Pupils are equal, round, and reactive to light.  Neck: Normal range of motion. Neck supple. No JVD present. No tracheal deviation present. No thyromegaly present.  Cardiovascular: Normal rate, regular rhythm, normal heart sounds and intact distal pulses.  Exam reveals no gallop and no friction rub.   No murmur heard. Pulmonary/Chest: Effort normal and breath sounds normal. No respiratory distress. She has no wheezes. She has no rales.  Abdominal: Soft. Bowel sounds are normal. She exhibits no distension and no mass. There is no tenderness.  Musculoskeletal: Normal range of motion. She exhibits edema. She exhibits no tenderness.  Trace to 1+ bilateral lower extremity edema. Patient has  compression stockings on  Lymphadenopathy:    She has no cervical adenopathy.  Neurological: She is alert and oriented to person, place, and time. She has normal reflexes. Gait normal.  Skin: Skin is warm and dry. No rash noted.    ECOG PERFORMANCE STATUS: 1 - Symptomatic but completely ambulatory  Blood pressure 132/75, pulse 111, temperature 98.4 F (36.9 C), temperature source Oral, resp. rate 18, height '5\' 4"'$  (1.626 m), weight 102 lb (46.267 kg), SpO2 98 %.  LABORATORY DATA: Lab Results  Component Value Date   WBC 4.0 12/17/2014   HGB 15.3 12/17/2014   HCT 48.2* 12/17/2014   MCV 84.1 12/17/2014   PLT 128* 12/17/2014      Chemistry      Component Value Date/Time   NA 136 12/17/2014 1136   K 4.2 12/17/2014 1136   CO2 30* 12/17/2014 1136   BUN 17.7 12/17/2014 1136   CREATININE  0.8 12/17/2014 1136   CREATININE 1.00 10/11/2014 1616      Component Value Date/Time   CALCIUM 9.9 12/17/2014 1136   ALKPHOS 100 12/17/2014 1136   AST 28 12/17/2014 1136   ALT 36 12/17/2014 1136   BILITOT 1.12 12/17/2014 1136       RADIOGRAPHIC STUDIES:  No results found.   ASSESSMENT/PLAN:  No problem-specific assessment & plan notes found for this encounter.  The patient is a pleasant 80 year old African-American female recently diagnosed with stage IIB non-small cell lung cancer,invasive squamous cell carcinoma.Patient was discussed with and also seen by Dr. Julien Nordmann. She will continue her course of concurrent  chemoradiation as scheduled. She is encouraged to increase her by mouth intake both of food and fluids. She'll follow-up in 2 weeks for reevaluation.  Susan Metro E, PA-C 12/17/2014  All questions were answered. The patient knows to call the clinic with any problems, questions or concerns. We can certainly see the patient much sooner if necessary.  ADDENDUM: Hematology/Oncology Attending: I had a face to face encounter with the patient. I recommended her care plan. This is a very pleasant 80 years old African-American female with a stage IIb non-small cell lung cancer currently undergoing concurrent chemoradiation with weekly carboplatin and  paclitaxel status post 4 cycles of treatment. The patient is tolerating her treatment fairly well except for the fatigue. She denied having any significant dysphagia or odynophagia. I recommended for her to continue her treatment as scheduled and she will receive cycle #5 today. The patient would come back for follow-up visit in 2 weeks for reevaluation and management of any adverse effect of her treatment. She was advised to call immediately if she has any concerning symptoms in the interval.  Disclaimer: This note was dictated with voice recognition software. Similar sounding words can inadvertently be transcribed and may be missed  upon review.  Eilleen Kempf., MD 12/19/2014

## 2014-12-17 NOTE — Patient Instructions (Signed)
[place] Discharge Instructions for Patients Receiving Chemotherapy  Today you received the following chemotherapy agents: Taxol and carboplatin.  To help prevent nausea and vomiting after your treatment, we encourage you to take your nausea medication: compazine 5 mg every 6 hours as needed.   If you develop nausea and vomiting that is not controlled by your nausea medication, call the clinic.   BELOW ARE SYMPTOMS THAT SHOULD BE REPORTED IMMEDIATELY:  *FEVER GREATER THAN 100.5 F  *CHILLS WITH OR WITHOUT FEVER  NAUSEA AND VOMITING THAT IS NOT CONTROLLED WITH YOUR NAUSEA MEDICATION  *UNUSUAL SHORTNESS OF BREATH  *UNUSUAL BRUISING OR BLEEDING  TENDERNESS IN MOUTH AND THROAT WITH OR WITHOUT PRESENCE OF ULCERS  *URINARY PROBLEMS  *BOWEL PROBLEMS  UNUSUAL RASH Items with * indicate a potential emergency and should be followed up as soon as possible.  Feel free to call the clinic you have any questions or concerns. The clinic phone number is [phone].  Please show the [place] [facility] at check-in to the Emergency Department and triage nurse.

## 2014-12-18 ENCOUNTER — Ambulatory Visit
Admission: RE | Admit: 2014-12-18 | Discharge: 2014-12-18 | Disposition: A | Discharge status: Home / Self Care | Payer: Medicare Other | Source: Ambulatory Visit | Attending: Radiation Oncology | Admitting: Radiation Oncology

## 2014-12-18 DIAGNOSIS — C3411 Malignant neoplasm of upper lobe, right bronchus or lung: Secondary | ICD-10-CM | POA: Diagnosis not present

## 2014-12-19 ENCOUNTER — Ambulatory Visit
Admission: RE | Admit: 2014-12-19 | Discharge: 2014-12-19 | Disposition: A | Discharge status: Home / Self Care | Payer: Medicare Other | Source: Ambulatory Visit | Attending: Radiation Oncology | Admitting: Radiation Oncology

## 2014-12-19 DIAGNOSIS — C3411 Malignant neoplasm of upper lobe, right bronchus or lung: Secondary | ICD-10-CM | POA: Diagnosis not present

## 2014-12-20 ENCOUNTER — Ambulatory Visit
Admission: RE | Admit: 2014-12-20 | Discharge: 2014-12-20 | Disposition: A | Discharge status: Home / Self Care | Payer: Medicare Other | Source: Ambulatory Visit | Attending: Radiation Oncology | Admitting: Radiation Oncology

## 2014-12-20 DIAGNOSIS — C3411 Malignant neoplasm of upper lobe, right bronchus or lung: Secondary | ICD-10-CM | POA: Diagnosis not present

## 2014-12-21 ENCOUNTER — Ambulatory Visit
Admission: RE | Admit: 2014-12-21 | Discharge: 2014-12-21 | Disposition: A | Discharge status: Home / Self Care | Payer: Medicare Other | Source: Ambulatory Visit | Attending: Radiation Oncology | Admitting: Radiation Oncology

## 2014-12-21 ENCOUNTER — Encounter: Payer: Self-pay | Admitting: Radiation Oncology

## 2014-12-21 VITALS — BP 140/72 | HR 95 | Resp 16 | Wt 103.4 lb

## 2014-12-21 DIAGNOSIS — C3411 Malignant neoplasm of upper lobe, right bronchus or lung: Secondary | ICD-10-CM

## 2014-12-21 NOTE — Progress Notes (Signed)
  Radiation Oncology         [phone]   Name: Susan Garrett MRN: [id number]   Date: 12/21/2014  DOB: 1935/05/05    Weekly Radiation Therapy Management    ICD-9-CM ICD-10-CM   1. Primary cancer of right upper lobe of lung 162.3 C34.11     Current Dose: 48 Gy  Planned Dose:  66 Gy  Narrative The patient presents for routine under treatment assessment. Denies pain. Patient concerned about white coating on tongue. Denies pain associated with swallowing. Denies difficulty swallowing. Reports she is supplementing her diet with Ensure. Reports nausea. Denies cough. Denies SOB. Reports fatigueSet-up films were reviewed. The chart was checked.  Physical Findings  weight is 103 lb 6.4 oz (46.902 kg). Her blood pressure is 140/72 and her pulse is 95. Her respiration is 16 and oxygen saturation is 98%.  4 lbs of weight loss is noted since late May, 2016.  No significant changes.  Impression The patient is tolerating radiation.  Plan Continue treatment as planned.    Sheral Apley Tammi Klippel, M.D.

## 2014-12-21 NOTE — Progress Notes (Addendum)
Weight and vitals stable. Denies pain. Patient concerned about white coating on tongue. Denies pain associated with swallowing. Denies difficulty swallowing. Reports she is supplementing her diet with Ensure. Reports nausea. Denies cough. Denies SOB. Reports fatigue.  BP 140/72 mmHg  Pulse 95  Resp 16  Wt 103 lb 6.4 oz (46.902 kg)  SpO2 98% Wt Readings from Last 3 Encounters:  12/21/14 103 lb 6.4 oz (46.902 kg)  12/17/14 102 lb (46.267 kg)  12/14/14 105 lb 9.6 oz (47.9 kg)

## 2014-12-24 ENCOUNTER — Other Ambulatory Visit: Payer: Self-pay | Admitting: Physician Assistant

## 2014-12-24 ENCOUNTER — Ambulatory Visit
Admission: RE | Admit: 2014-12-24 | Discharge: 2014-12-24 | Disposition: A | Discharge status: Home / Self Care | Payer: Medicare Other | Source: Ambulatory Visit | Attending: Radiation Oncology | Admitting: Radiation Oncology

## 2014-12-24 ENCOUNTER — Other Ambulatory Visit (HOSPITAL_BASED_OUTPATIENT_CLINIC_OR_DEPARTMENT_OTHER): Payer: Medicare Other

## 2014-12-24 ENCOUNTER — Ambulatory Visit: Payer: Medicare Other

## 2014-12-24 DIAGNOSIS — C3411 Malignant neoplasm of upper lobe, right bronchus or lung: Secondary | ICD-10-CM | POA: Diagnosis not present

## 2014-12-24 DIAGNOSIS — C3491 Malignant neoplasm of unspecified part of right bronchus or lung: Secondary | ICD-10-CM | POA: Diagnosis not present

## 2014-12-24 LAB — CBC WITH DIFFERENTIAL/PLATELET
BASO%: 0.9 % (ref 0.0–2.0)
Basophils Absolute: 0 10*3/uL (ref 0.0–0.1)
EOS%: 0.7 % (ref 0.0–7.0)
Eosinophils Absolute: 0 10*3/uL (ref 0.0–0.5)
HEMATOCRIT: 46 % (ref 34.8–46.6)
HGB: 14.4 g/dL (ref 11.6–15.9)
LYMPH#: 0.5 10*3/uL — AB (ref 0.9–3.3)
LYMPH%: 18.4 % (ref 14.0–49.7)
MCH: 26.2 pg (ref 25.1–34.0)
MCHC: 31.3 g/dL — AB (ref 31.5–36.0)
MCV: 83.7 fL (ref 79.5–101.0)
MONO#: 0.2 10*3/uL (ref 0.1–0.9)
MONO%: 5.3 % (ref 0.0–14.0)
NEUT#: 2.2 10*3/uL (ref 1.5–6.5)
NEUT%: 74.7 % (ref 38.4–76.8)
Platelets: 103 10*3/uL — ABNORMAL LOW (ref 145–400)
RBC: 5.5 10*6/uL — ABNORMAL HIGH (ref 3.70–5.45)
RDW: 15.6 % — AB (ref 11.2–14.5)
WBC: 3 10*3/uL — ABNORMAL LOW (ref 3.9–10.3)

## 2014-12-24 LAB — COMPREHENSIVE METABOLIC PANEL (CC13)
ALT: 404 U/L — AB (ref 0–55)
AST: 203 U/L (ref 5–34)
Albumin: 3.3 g/dL — ABNORMAL LOW (ref 3.5–5.0)
Alkaline Phosphatase: 328 U/L — ABNORMAL HIGH (ref 40–150)
Anion Gap: 6 mEq/L (ref 3–11)
BUN: 17.2 mg/dL (ref 7.0–26.0)
CO2: 31 meq/L — AB (ref 22–29)
Calcium: 9.2 mg/dL (ref 8.4–10.4)
Chloride: 100 mEq/L (ref 98–109)
Creatinine: 0.8 mg/dL (ref 0.6–1.1)
EGFR: 85 mL/min/{1.73_m2} — AB (ref >90)
GLUCOSE: 150 mg/dL — AB (ref 70–140)
Potassium: 3.4 mEq/L — ABNORMAL LOW (ref 3.5–5.1)
Sodium: 137 mEq/L (ref 136–145)
Total Bilirubin: 1.88 mg/dL — ABNORMAL HIGH (ref 0.20–1.20)
Total Protein: 6.3 g/dL — ABNORMAL LOW (ref 6.4–8.3)

## 2014-12-24 NOTE — Progress Notes (Unsigned)
Chemo canceled today d/t elevated LFTs.   Pt instructed by Awilda Metro, PA-C to return next week as scheduled for lab/office visit and possibly chemo.  She verbalized understanding.

## 2014-12-25 ENCOUNTER — Ambulatory Visit
Admission: RE | Admit: 2014-12-25 | Discharge: 2014-12-25 | Disposition: A | Discharge status: Home / Self Care | Payer: Medicare Other | Source: Ambulatory Visit | Attending: Radiation Oncology | Admitting: Radiation Oncology

## 2014-12-25 DIAGNOSIS — C3411 Malignant neoplasm of upper lobe, right bronchus or lung: Secondary | ICD-10-CM | POA: Diagnosis not present

## 2014-12-26 ENCOUNTER — Ambulatory Visit
Admission: RE | Admit: 2014-12-26 | Discharge: 2014-12-26 | Disposition: A | Discharge status: Home / Self Care | Payer: Medicare Other | Source: Ambulatory Visit | Attending: Radiation Oncology | Admitting: Radiation Oncology

## 2014-12-26 DIAGNOSIS — C3411 Malignant neoplasm of upper lobe, right bronchus or lung: Secondary | ICD-10-CM | POA: Diagnosis not present

## 2014-12-27 ENCOUNTER — Ambulatory Visit
Admission: RE | Admit: 2014-12-27 | Discharge: 2014-12-27 | Disposition: A | Discharge status: Home / Self Care | Payer: Medicare Other | Source: Ambulatory Visit | Attending: Radiation Oncology | Admitting: Radiation Oncology

## 2014-12-27 DIAGNOSIS — C3411 Malignant neoplasm of upper lobe, right bronchus or lung: Secondary | ICD-10-CM | POA: Diagnosis not present

## 2014-12-28 ENCOUNTER — Ambulatory Visit
Admission: RE | Admit: 2014-12-28 | Discharge: 2014-12-28 | Disposition: A | Discharge status: Home / Self Care | Payer: Medicare Other | Source: Ambulatory Visit | Attending: Radiation Oncology | Admitting: Radiation Oncology

## 2014-12-28 ENCOUNTER — Encounter: Payer: Self-pay | Admitting: Radiation Oncology

## 2014-12-28 VITALS — BP 128/81 | HR 98 | Temp 98.0°F | Resp 16 | Wt 102.5 lb

## 2014-12-28 DIAGNOSIS — C3411 Malignant neoplasm of upper lobe, right bronchus or lung: Secondary | ICD-10-CM | POA: Diagnosis not present

## 2014-12-28 NOTE — Progress Notes (Addendum)
Weight and vitals stable. Patient reports nausea, weakness and generalized fatigue. Reports new onset of a sore throat. Reports an occasional dry cough. Reports SOB with exertion. Chemotherapy held Monday because of low counts. Also, patient concerned because she feel cold often. No skin changes noted within treatment field. Reports using radiaplex bid as directed.   BP 128/81 mmHg  Pulse 98  Temp(Src) 98 F (36.7 C) (Oral)  Resp 16  Wt 102 lb 8 oz (46.494 kg)  SpO2 100% Wt Readings from Last 3 Encounters:  12/28/14 102 lb 8 oz (46.494 kg)  12/21/14 103 lb 6.4 oz (46.902 kg)  12/17/14 102 lb (46.267 kg)

## 2014-12-28 NOTE — Progress Notes (Signed)
  Radiation Oncology         [phone]   Name: Susan Garrett MRN: [id number]   Date: 12/28/2014  DOB: Oct 10, 1934    Weekly Radiation Therapy Management    ICD-9-CM ICD-10-CM   1. Primary cancer of right upper lobe of lung 162.3 C34.11     Current Dose: 58 Gy  Planned Dose:  66 Gy  Narrative The patient presents for routine under treatment assessment. Weight and vitals stable. Patient reports that she feels awful today. Patient reports nausea, weakness and generalized fatigue. Reports new onset of a sore throat. Reports an occasional dry cough. Reports SOB with exertion. Chemotherapy held Monday because of low counts. Also, patient concerned because she feels cold often. No skin changes noted within treatment field. Reports using radiaplex bid as directed. Her daughter reports Susan Garrett felt good up until today.  Set-up films were reviewed. The chart was checked.  Physical Findings  weight is 102 lb 8 oz (46.494 kg). Her oral temperature is 98 F (36.7 C). Her blood pressure is 128/81 and her pulse is 98. Her respiration is 16 and oxygen saturation is 100%.   No significant chan ges.  Impression The patient is tolerating radiation.  Plan Continue treatment as planned. Suggested mucinex or robitussin to help her sore throat, advising to stay hydrated while taking these medicines.   This document serves as a record of services personally performed by Tyler Pita, MD. It was created on his behalf by Arlyce Harman, a trained medical scribe. The creation of this record is based on the scribe's personal observations and the provider's statements to them. This document has been checked and approved by the attending provider.     Sheral Apley Tammi Klippel, M.D.

## 2014-12-31 ENCOUNTER — Other Ambulatory Visit: Payer: Medicare Other

## 2014-12-31 ENCOUNTER — Ambulatory Visit (HOSPITAL_BASED_OUTPATIENT_CLINIC_OR_DEPARTMENT_OTHER): Payer: Medicare Other | Admitting: Physician Assistant

## 2014-12-31 ENCOUNTER — Other Ambulatory Visit (HOSPITAL_BASED_OUTPATIENT_CLINIC_OR_DEPARTMENT_OTHER): Payer: Medicare Other

## 2014-12-31 ENCOUNTER — Telehealth: Payer: Self-pay | Admitting: Physician Assistant

## 2014-12-31 ENCOUNTER — Ambulatory Visit (HOSPITAL_BASED_OUTPATIENT_CLINIC_OR_DEPARTMENT_OTHER): Payer: Medicare Other

## 2014-12-31 ENCOUNTER — Ambulatory Visit
Admission: RE | Admit: 2014-12-31 | Discharge: 2014-12-31 | Disposition: A | Discharge status: Home / Self Care | Payer: Medicare Other | Source: Ambulatory Visit | Attending: Radiation Oncology | Admitting: Radiation Oncology

## 2014-12-31 ENCOUNTER — Encounter: Payer: Self-pay | Admitting: Physician Assistant

## 2014-12-31 VITALS — BP 113/63 | HR 118 | Temp 98.5°F | Resp 18 | Ht 64.0 in | Wt 101.3 lb

## 2014-12-31 DIAGNOSIS — Z5111 Encounter for antineoplastic chemotherapy: Secondary | ICD-10-CM | POA: Diagnosis not present

## 2014-12-31 DIAGNOSIS — C3491 Malignant neoplasm of unspecified part of right bronchus or lung: Secondary | ICD-10-CM

## 2014-12-31 DIAGNOSIS — R070 Pain in throat: Secondary | ICD-10-CM | POA: Diagnosis not present

## 2014-12-31 DIAGNOSIS — R530 Neoplastic (malignant) related fatigue: Secondary | ICD-10-CM | POA: Diagnosis not present

## 2014-12-31 DIAGNOSIS — C3411 Malignant neoplasm of upper lobe, right bronchus or lung: Secondary | ICD-10-CM

## 2014-12-31 LAB — CBC WITH DIFFERENTIAL/PLATELET
BASO%: 0.6 % (ref 0.0–2.0)
BASOS ABS: 0 10*3/uL (ref 0.0–0.1)
EOS%: 0.7 % (ref 0.0–7.0)
Eosinophils Absolute: 0 10*3/uL (ref 0.0–0.5)
HCT: 44.2 % (ref 34.8–46.6)
HGB: 14.1 g/dL (ref 11.6–15.9)
LYMPH#: 0.4 10*3/uL — AB (ref 0.9–3.3)
LYMPH%: 16.1 % (ref 14.0–49.7)
MCH: 27 pg (ref 25.1–34.0)
MCHC: 32 g/dL (ref 31.5–36.0)
MCV: 84.2 fL (ref 79.5–101.0)
MONO#: 0.5 10*3/uL (ref 0.1–0.9)
MONO%: 24.5 % — AB (ref 0.0–14.0)
NEUT%: 58.1 % (ref 38.4–76.8)
NEUTROS ABS: 1.3 10*3/uL — AB (ref 1.5–6.5)
PLATELETS: 185 10*3/uL (ref 145–400)
RBC: 5.25 10*6/uL (ref 3.70–5.45)
RDW: 17.1 % — AB (ref 11.2–14.5)
WBC: 2.2 10*3/uL — AB (ref 3.9–10.3)

## 2014-12-31 LAB — COMPREHENSIVE METABOLIC PANEL (CC13)
ALBUMIN: 3.2 g/dL — AB (ref 3.5–5.0)
ALK PHOS: 190 U/L — AB (ref 40–150)
ALT: 64 U/L — ABNORMAL HIGH (ref 0–55)
AST: 27 U/L (ref 5–34)
Anion Gap: 10 mEq/L (ref 3–11)
BUN: 12.4 mg/dL (ref 7.0–26.0)
CALCIUM: 9.3 mg/dL (ref 8.4–10.4)
CO2: 26 meq/L (ref 22–29)
CREATININE: 0.8 mg/dL (ref 0.6–1.1)
Chloride: 100 mEq/L (ref 98–109)
EGFR: 87 mL/min/{1.73_m2} — ABNORMAL LOW (ref >90)
Glucose: 165 mg/dl — ABNORMAL HIGH (ref 70–140)
POTASSIUM: 4.4 meq/L (ref 3.5–5.1)
Sodium: 136 mEq/L (ref 136–145)
Total Bilirubin: 1.34 mg/dL — ABNORMAL HIGH (ref 0.20–1.20)
Total Protein: 6.5 g/dL (ref 6.4–8.3)

## 2014-12-31 MED ORDER — SODIUM CHLORIDE 0.9 % IV SOLN
Freq: Once | INTRAVENOUS | Status: AC
  Administered 2014-12-31: 15:00:00 via INTRAVENOUS
  Filled 2014-12-31: qty 8

## 2014-12-31 MED ORDER — SODIUM CHLORIDE 0.9 % IV SOLN
Freq: Once | INTRAVENOUS | Status: AC
  Administered 2014-12-31: 14:00:00 via INTRAVENOUS

## 2014-12-31 MED ORDER — DIPHENHYDRAMINE HCL 50 MG/ML IJ SOLN
50.0000 mg | Freq: Once | INTRAMUSCULAR | Status: AC
  Administered 2014-12-31: 50 mg via INTRAVENOUS

## 2014-12-31 MED ORDER — FAMOTIDINE IN NACL 20-0.9 MG/50ML-% IV SOLN
20.0000 mg | Freq: Once | INTRAVENOUS | Status: AC
  Administered 2014-12-31: 20 mg via INTRAVENOUS

## 2014-12-31 MED ORDER — SODIUM CHLORIDE 0.9 % IV SOLN
121.6000 mg | Freq: Once | INTRAVENOUS | Status: AC
  Administered 2014-12-31: 120 mg via INTRAVENOUS
  Filled 2014-12-31: qty 12

## 2014-12-31 MED ORDER — DIPHENHYDRAMINE HCL 50 MG/ML IJ SOLN
INTRAMUSCULAR | Status: AC
  Filled 2014-12-31: qty 1

## 2014-12-31 MED ORDER — DEXTROSE 5 % IV SOLN
45.0000 mg/m2 | Freq: Once | INTRAVENOUS | Status: AC
  Administered 2014-12-31: 66 mg via INTRAVENOUS
  Filled 2014-12-31: qty 11

## 2014-12-31 MED ORDER — FAMOTIDINE IN NACL 20-0.9 MG/50ML-% IV SOLN
INTRAVENOUS | Status: AC
Start: 2014-12-31 — End: 2014-12-31
  Filled 2014-12-31: qty 50

## 2014-12-31 NOTE — Progress Notes (Signed)
Ok to treat with ANC: 1.3 per Awilda Metro PA.

## 2014-12-31 NOTE — Telephone Encounter (Signed)
Pt confirmed labs/ov per 07/25 POF, gave pt AVS and Calendar.... KJ

## 2014-12-31 NOTE — Patient Instructions (Signed)
Complete your course of concurrent chemoradiation as scheduled Follow-up in approximate 4-5 weeks with a restaging CT scan of your chest to reevaluate your disease

## 2014-12-31 NOTE — Progress Notes (Addendum)
No images are attached to the encounter. No scans are attached to the encounter. No scans are attached to the encounter. [facility] VISIT PROGRESS NOTE  Susan Spurling, MD [address] [place] [address]  DIAGNOSIS: Primary cancer of right lung   Staging form: Lung, AJCC 7th Edition     Clinical stage from 11/01/2014: Stage IIB (T2b, N1, M0) - Signed by Curt Bears, MD on 11/03/2014  PRIOR THERAPY: None  CURRENT THERAPY: Course of concurrent chemoradiation with chemotherapy in the form of weekly carboplatin for an AUC of 2 and paclitaxel 45 mg/m given concurrent with radiation for 6-7 weeks. First cycle started 11/19/2014  INTERVAL HISTORY: Susan Garrett 80 y.o. female returns for a scheduled regular office visit for followup of her recently diagnosed stage IIB non-small cell lung cancer, squamous cell carcinoma. She is tolerating her course of concurrent chemoradiation relatively well with the exception of some discomfort with swallowing and fatigue.  Her primary care physician just started her on Carafate. She continues to try to well but has lost another pound. Her chemotherapy was held last week due to increased liver function studies and increased total bilirubin. She is not jaundiced. She does report some left upper abdominal discomfort/pain. She denied any nausea, vomiting, diarrhea or constipation. She reports feeling weak occasionally but has not sustained any falls or near falls. She does have some occasional nausea that is self limiting or is well-controlled with her current antiemetics.  She denied specific fever or chills. She voiced no other specific complaints.  MEDICAL HISTORY: Past Medical History  Diagnosis Date  . Hypertension   . Corns and callosities   . Callus   . Hyperthyroidism   . Encounter for antineoplastic chemotherapy 11/13/2014  . Encounter for antineoplastic chemotherapy 11/13/2014    ALLERGIES:  is allergic to  shellfish allergy.  MEDICATIONS:  Current Outpatient Prescriptions  Medication Sig Dispense Refill  . ALPRAZolam (XANAX) 0.5 MG tablet Take 1 tab one hour before procedure, take second tab if needed 2 tablet 0  . aspirin 81 MG tablet Take 81 mg by mouth daily.    . methimazole (TAPAZOLE) 5 MG tablet     . methylPREDNISolone (MEDROL DOSEPAK) 4 MG TBPK tablet Use as instructed. Take with food 21 tablet 0  . metoprolol succinate (TOPROL-XL) 25 MG 24 hr tablet Take 25 mg by mouth daily.     . prochlorperazine (COMPAZINE) 5 MG tablet Take 1 tablet (5 mg total) by mouth every 6 (six) hours as needed for nausea or vomiting. 30 tablet 0  . sucralfate (CARAFATE) 1 G tablet Take 1 g by mouth 4 (four) times daily -  with meals and at bedtime.    . Wound Cleansers (RADIAPLEX EX) Apply topically.     No current facility-administered medications for this visit.    SURGICAL HISTORY:  Past Surgical History  Procedure Laterality Date  . Lesion removal Left 11/12/87  . Vaginal hysterectomy    . Video bronchoscopy Bilateral 10/18/2014    Procedure: VIDEO BRONCHOSCOPY WITH FLUORO;  Surgeon: Tanda Rockers, MD;  Location: [facility];  Service: Endoscopy;  Laterality: Bilateral;    REVIEW OF SYSTEMS:  Review of Systems  Constitutional: Positive for weight loss and malaise/fatigue. Negative for fever, chills and diaphoresis.  HENT: Negative for congestion, ear discharge, ear pain, hearing loss, nosebleeds, sore throat and tinnitus.        Reports hair loss. Reports discomfort with swallowing  Eyes: Negative for blurred  vision, double vision, photophobia, pain, discharge and redness.  Respiratory: Positive for shortness of breath. Negative for hemoptysis, sputum production, wheezing and stridor.   Cardiovascular: Negative for chest pain, palpitations, orthopnea, claudication, leg swelling and PND.  Gastrointestinal: Negative for heartburn, nausea, vomiting, abdominal pain, diarrhea, constipation, blood in  stool and melena.  Genitourinary: Negative.   Musculoskeletal:       Positive for muscle weakness  Skin: Negative.   Neurological: Negative for dizziness, tingling, focal weakness, seizures, weakness and headaches.  Endo/Heme/Allergies: Does not bruise/bleed easily.  Psychiatric/Behavioral: Negative for depression. The patient is not nervous/anxious and does not have insomnia.      PHYSICAL EXAMINATION: Physical Exam  Constitutional: She is oriented to person, place, and time and well-developed, well-nourished, and in no distress.  HENT:  Head: Normocephalic and atraumatic.  Mouth/Throat: Oropharynx is clear and moist.  Eyes: Pupils are equal, round, and reactive to light.  Neck: Normal range of motion. Neck supple. No JVD present. No tracheal deviation present. No thyromegaly present.  Cardiovascular: Normal rate, regular rhythm, normal heart sounds and intact distal pulses.  Exam reveals no gallop and no friction rub.   No murmur heard. Pulmonary/Chest: Effort normal and breath sounds normal. No respiratory distress. She has no wheezes. She has no rales.  Abdominal: Soft. Bowel sounds are normal. She exhibits no distension and no mass. There is no tenderness.  Musculoskeletal: Normal range of motion. She exhibits edema. She exhibits no tenderness.  Trace to 1+ bilateral lower extremity edema. Patient has  compression stockings on  Lymphadenopathy:    She has no cervical adenopathy.  Neurological: She is alert and oriented to person, place, and time. She has normal reflexes. Gait normal.  Skin: Skin is warm and dry. No rash noted.    ECOG PERFORMANCE STATUS: 1 - Symptomatic but completely ambulatory  Blood pressure 113/63, pulse 118, temperature 98.5 F (36.9 C), temperature source Oral, resp. rate 18, height '5\' 4"'$  (1.626 m), weight 101 lb 4.8 oz (45.949 kg), SpO2 97 %.  LABORATORY DATA: Lab Results  Component Value Date   WBC 2.2* 12/31/2014   HGB 14.1 12/31/2014   HCT 44.2  12/31/2014   MCV 84.2 12/31/2014   PLT 185 12/31/2014      Chemistry      Component Value Date/Time   NA 136 12/31/2014 1154   K 4.4 12/31/2014 1154   CO2 26 12/31/2014 1154   BUN 12.4 12/31/2014 1154   CREATININE 0.8 12/31/2014 1154   CREATININE 1.00 10/11/2014 1616      Component Value Date/Time   CALCIUM 9.3 12/31/2014 1154   ALKPHOS 190* 12/31/2014 1154   AST 27 12/31/2014 1154   ALT 64* 12/31/2014 1154   BILITOT 1.34* 12/31/2014 1154       RADIOGRAPHIC STUDIES:  No results found.   ASSESSMENT/PLAN:  No problem-specific assessment & plan notes found for this encounter.  The patient is a pleasant 80 year old African-American female recently diagnosed with stage IIB non-small cell lung cancer,invasive squamous cell carcinoma.Patient was discussed with and also seen by Dr. Julien Nordmann. She will complete her course of concurrent  chemoradiation as scheduled. She is encouraged to increase her by mouth intake both of food and fluids. She'll follow-up in approximately weeks for with a restaging CT scan of her chest with contrast to reevaluate her disease.   Carlton Adam, PA-C 12/31/2014  All questions were answered. The patient knows to call the clinic with any problems, questions or concerns. We can certainly see  the patient much sooner if necessary.    ADDENDUM: Hematology/Oncology Attending: I had a face to face encounter with the patient today. I recommended her care plan. This is a very pleasant 80 years old African-American female with a stage IIB non-small cell lung cancer currently undergoing concurrent chemoradiation with weekly carboplatin and paclitaxel is status post 5 cycles. The patient is tolerating her treatment well except for the fatigue and mild sore throat. I recommended for her to proceed with the last cycle of her treatment today as a scheduled. She will come back for follow-up visit in one month's for reevaluation after repeating CT scan of the chest  for restaging of her disease. She was advised to call immediately if she has any concerning symptoms in the interval.  Disclaimer: This note was dictated with voice recognition software. Similar sounding words can inadvertently be transcribed and may not be corrected upon review.  Eilleen Kempf., MD 12/31/2014

## 2014-12-31 NOTE — Patient Instructions (Signed)
[facility] Discharge Instructions for Patients Receiving Chemotherapy  Today you received the following chemotherapy agents Paclitaxel/Carboplatin.  To help prevent nausea and vomiting after your treatment, we encourage you to take your nausea medication as directed.    If you develop nausea and vomiting that is not controlled by your nausea medication, call the clinic.   BELOW ARE SYMPTOMS THAT SHOULD BE REPORTED IMMEDIATELY:  *FEVER GREATER THAN 100.5 F  *CHILLS WITH OR WITHOUT FEVER  NAUSEA AND VOMITING THAT IS NOT CONTROLLED WITH YOUR NAUSEA MEDICATION  *UNUSUAL SHORTNESS OF BREATH  *UNUSUAL BRUISING OR BLEEDING  TENDERNESS IN MOUTH AND THROAT WITH OR WITHOUT PRESENCE OF ULCERS  *URINARY PROBLEMS  *BOWEL PROBLEMS  UNUSUAL RASH Items with * indicate a potential emergency and should be followed up as soon as possible.  Feel free to call the clinic you have any questions or concerns. The clinic phone number is [phone].  Please show the [facility] at check-in to the Emergency Department and triage nurse.

## 2015-01-01 ENCOUNTER — Ambulatory Visit
Admission: RE | Admit: 2015-01-01 | Discharge: 2015-01-01 | Disposition: A | Discharge status: Home / Self Care | Payer: Medicare Other | Source: Ambulatory Visit | Attending: Radiation Oncology | Admitting: Radiation Oncology

## 2015-01-01 DIAGNOSIS — C3411 Malignant neoplasm of upper lobe, right bronchus or lung: Secondary | ICD-10-CM | POA: Diagnosis not present

## 2015-01-02 ENCOUNTER — Ambulatory Visit: Payer: Medicare Other

## 2015-01-02 ENCOUNTER — Ambulatory Visit
Admission: RE | Admit: 2015-01-02 | Discharge: 2015-01-02 | Disposition: A | Discharge status: Home / Self Care | Payer: Medicare Other | Source: Ambulatory Visit | Attending: Radiation Oncology | Admitting: Radiation Oncology

## 2015-01-02 DIAGNOSIS — C3411 Malignant neoplasm of upper lobe, right bronchus or lung: Secondary | ICD-10-CM | POA: Diagnosis not present

## 2015-01-03 ENCOUNTER — Encounter: Payer: Self-pay | Admitting: Radiation Oncology

## 2015-01-03 ENCOUNTER — Ambulatory Visit
Admission: RE | Admit: 2015-01-03 | Discharge: 2015-01-03 | Disposition: A | Discharge status: Home / Self Care | Payer: Medicare Other | Source: Ambulatory Visit | Attending: Radiation Oncology | Admitting: Radiation Oncology

## 2015-01-03 ENCOUNTER — Ambulatory Visit: Payer: Medicare Other | Admitting: Internal Medicine

## 2015-01-03 VITALS — BP 114/65 | HR 107 | Temp 98.1°F | Resp 16 | Ht 64.0 in | Wt 101.3 lb

## 2015-01-03 DIAGNOSIS — C3411 Malignant neoplasm of upper lobe, right bronchus or lung: Secondary | ICD-10-CM | POA: Diagnosis not present

## 2015-01-03 NOTE — Progress Notes (Signed)
  Radiation Oncology         [phone]   Name: Susan Garrett MRN: [id number]   Date: 01/03/2015  DOB: 11/11/1934    Weekly Radiation Therapy Management  Diagnosis: Susan Garrett is a 80 year old woman presenting to clinic in regards to her primary cancer of right upper lobe of lung.  Current Dose: 66 Gy  Planned Dose:  66 Gy  Narrative The patient presents for routine under treatment assessment and has completed her radiation treatment with 33 out of 33 fractions to the right lung. She denies pain, having a sore throat or trouble swallowing.  The patient reports an occasional dry cough, shortness of breath, and poor appetite with food "not tasting right". Chemotherapy treatment occurred on Monday of this week (12/27/2014). It was observed that the skin on her right upper chest and back is red, in which she is administering radiaplex gel to alleviate.  Set-up films were reviewed and the chart was checked. She was cold during the visit and provided with a warm blanket. Today, she was accompanied by her daughter and got to ring the bell!  Physical Findings  height is '5\' 4"'$  (1.626 m) and weight is 101 lb 4.8 oz (45.949 kg). Her oral temperature is 98.1 F (36.7 C). Her blood pressure is 114/65 and her pulse is 107. Her respiration is 16 and oxygen saturation is 99%.   No significant changes in the status of the patient's overall health are noted at this time.  Impression Susan Garrett is a 80 year old woman presenting to clinic in regards to her primary cancer of right upper lobe of lung. The patient is tolerating radiation well.  Plan All questions vocalized by the patient were fully addressed. Common symptoms to expect at this time in the patient's recovery were discussed and reviewed. Healthy methods to manage these symptoms if they are to occur were addressed. She is advised of her follow up appointment to take place with radiation oncology in one month. Evidence that the cancer is responding well  and is shrinking, was address. The importance of rest and proper diet was emphasized for optimal healing and best habits when in recovery. She is encouraged to continue administration of Carafate medication and Radiaplex Gel as needed. Note that Carafate has made the patient ill in the past. The patient and her daughter were made aware of a CT scan to take place in August of 2016. If she develops any further questions or concerns in regards to her treatment and recovery, he has been encouraged to contact Dr. Tammi Klippel, MD.   This document serves as a record of services personally performed by Tyler Pita, MD. It was created on his behalf by Lenn Cal, a trained medical scribe. The creation of this record is based on the scribe's personal observations and the provider's statements to them. This document has been checked and approved by the attending provider.  __________________________________________  Sheral Apley. Tammi Klippel, M.D.

## 2015-01-03 NOTE — Progress Notes (Signed)
Nya Monds has completed treatment with 33 fractions to her right lung.  She denies pain.  She reports trying to take the carafate but it made her sick to her stomach.  She denies having a sore throat or trouble swallowing.  She reports an occasional dry cough.  She reports shortness of breath at times.  She had chemotherapy on Monday.  She reports a poor appetite with food not tasting right.  The skin on her right upper chest and back is red.  She is using radiaplex gel.  She has been given a one month follow up appointment card.  BP 114/65 mmHg  Pulse 107  Temp(Src) 98.1 F (36.7 C) (Oral)  Resp 16  Ht '5\' 4"'$  (1.626 m)  Wt 101 lb 4.8 oz (45.949 kg)  BMI 17.38 kg/m2  SpO2 99%   Wt Readings from Last 3 Encounters:  01/03/15 101 lb 4.8 oz (45.949 kg)  12/31/14 101 lb 4.8 oz (45.949 kg)  12/28/14 102 lb 8 oz (46.494 kg)

## 2015-01-07 NOTE — Progress Notes (Signed)
  Radiation Oncology         [phone] ________________________________  Name: Susan Garrett MRN: [id number]  Date: 01/03/2015  DOB: November 23, 1934  End of Treatment Note   ICD-9-CM ICD-10-CM   1. 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     Indication for treatment:  Curative Chemo-Radiotherapy       Radiation treatment dates:   11/19/2014-01/03/2015  Site/dose:  Right upper lung primary tumor and involved nodes were treated to 66 Gy in 33 fractions  Beams/energy:   A 5-field technique was used with 6 and 10 MV photons conformally covering the target with daily image guidance CT  Narrative: The patient tolerated radiation treatment relatively well.  She denied pain, having a sore throat or trouble swallowing.  The patient reported an occasional dry cough, shortness of breath, and poor appetite with food "not tasting right". Chemotherapy treatment occurred during radiation.  She had radiation dermatitis on her right upper chest and back, for which she was administering radiaplex gel to alleviate.   Plan: The patient has completed radiation treatment. The patient will return to radiation oncology clinic for routine followup in one month. I advised her to call or return sooner if she has any questions or concerns related to her recovery or treatment.  She'll continue current skin care. ________________________________  Sheral Apley. Tammi Klippel, M.D.

## 2015-01-14 ENCOUNTER — Emergency Department (HOSPITAL_COMMUNITY): Payer: Medicare Other

## 2015-01-14 ENCOUNTER — Inpatient Hospital Stay (HOSPITAL_COMMUNITY)
Admission: EM | Admit: 2015-01-14 | Discharge: 2015-01-16 | DRG: 190 | Disposition: A | Discharge status: Home Health | Payer: Medicare Other | Attending: Internal Medicine | Admitting: Internal Medicine

## 2015-01-14 ENCOUNTER — Encounter (HOSPITAL_COMMUNITY): Payer: Self-pay | Admitting: Emergency Medicine

## 2015-01-14 DIAGNOSIS — Z91013 Allergy to seafood: Secondary | ICD-10-CM

## 2015-01-14 DIAGNOSIS — E0590 Thyrotoxicosis, unspecified without thyrotoxic crisis or storm: Secondary | ICD-10-CM | POA: Diagnosis present

## 2015-01-14 DIAGNOSIS — J9601 Acute respiratory failure with hypoxia: Secondary | ICD-10-CM | POA: Diagnosis present

## 2015-01-14 DIAGNOSIS — T451X5A Adverse effect of antineoplastic and immunosuppressive drugs, initial encounter: Secondary | ICD-10-CM | POA: Diagnosis present

## 2015-01-14 DIAGNOSIS — Z87891 Personal history of nicotine dependence: Secondary | ICD-10-CM | POA: Diagnosis not present

## 2015-01-14 DIAGNOSIS — J441 Chronic obstructive pulmonary disease with (acute) exacerbation: Principal | ICD-10-CM | POA: Diagnosis present

## 2015-01-14 DIAGNOSIS — I4581 Long QT syndrome: Secondary | ICD-10-CM | POA: Diagnosis present

## 2015-01-14 DIAGNOSIS — E43 Unspecified severe protein-calorie malnutrition: Secondary | ICD-10-CM | POA: Diagnosis present

## 2015-01-14 DIAGNOSIS — Z79899 Other long term (current) drug therapy: Secondary | ICD-10-CM | POA: Diagnosis not present

## 2015-01-14 DIAGNOSIS — E46 Unspecified protein-calorie malnutrition: Secondary | ICD-10-CM

## 2015-01-14 DIAGNOSIS — R0600 Dyspnea, unspecified: Secondary | ICD-10-CM | POA: Diagnosis not present

## 2015-01-14 DIAGNOSIS — Z800 Family history of malignant neoplasm of digestive organs: Secondary | ICD-10-CM | POA: Diagnosis not present

## 2015-01-14 DIAGNOSIS — R531 Weakness: Secondary | ICD-10-CM | POA: Diagnosis present

## 2015-01-14 DIAGNOSIS — J9600 Acute respiratory failure, unspecified whether with hypoxia or hypercapnia: Secondary | ICD-10-CM

## 2015-01-14 DIAGNOSIS — I10 Essential (primary) hypertension: Secondary | ICD-10-CM | POA: Diagnosis not present

## 2015-01-14 DIAGNOSIS — C3490 Malignant neoplasm of unspecified part of unspecified bronchus or lung: Secondary | ICD-10-CM | POA: Diagnosis present

## 2015-01-14 DIAGNOSIS — R000 Tachycardia, unspecified: Secondary | ICD-10-CM

## 2015-01-14 DIAGNOSIS — Z801 Family history of malignant neoplasm of trachea, bronchus and lung: Secondary | ICD-10-CM | POA: Diagnosis not present

## 2015-01-14 DIAGNOSIS — E860 Dehydration: Secondary | ICD-10-CM | POA: Diagnosis present

## 2015-01-14 DIAGNOSIS — Z681 Body mass index (BMI) 19 or less, adult: Secondary | ICD-10-CM

## 2015-01-14 DIAGNOSIS — I209 Angina pectoris, unspecified: Secondary | ICD-10-CM | POA: Diagnosis present

## 2015-01-14 DIAGNOSIS — D72819 Decreased white blood cell count, unspecified: Secondary | ICD-10-CM | POA: Diagnosis present

## 2015-01-14 DIAGNOSIS — Z8249 Family history of ischemic heart disease and other diseases of the circulatory system: Secondary | ICD-10-CM | POA: Diagnosis not present

## 2015-01-14 DIAGNOSIS — R0902 Hypoxemia: Secondary | ICD-10-CM | POA: Diagnosis present

## 2015-01-14 DIAGNOSIS — Z825 Family history of asthma and other chronic lower respiratory diseases: Secondary | ICD-10-CM

## 2015-01-14 DIAGNOSIS — Z7982 Long term (current) use of aspirin: Secondary | ICD-10-CM | POA: Diagnosis not present

## 2015-01-14 LAB — BASIC METABOLIC PANEL
Anion gap: 9 (ref 5–15)
BUN: 15 mg/dL (ref 6–20)
CHLORIDE: 93 mmol/L — AB (ref 101–111)
CO2: 34 mmol/L — ABNORMAL HIGH (ref 22–32)
Calcium: 9.7 mg/dL (ref 8.9–10.3)
Creatinine, Ser: 0.79 mg/dL (ref 0.44–1.00)
GFR calc Af Amer: >60 mL/min (ref >60)
GFR calc non Af Amer: >60 mL/min (ref >60)
Glucose, Bld: 134 mg/dL — ABNORMAL HIGH (ref 65–99)
POTASSIUM: 4.1 mmol/L (ref 3.5–5.1)
SODIUM: 136 mmol/L (ref 135–145)

## 2015-01-14 LAB — CBC WITH DIFFERENTIAL/PLATELET
BASOS PCT: 1 % (ref 0–1)
Basophils Absolute: 0 10*3/uL (ref 0.0–0.1)
Eosinophils Absolute: 0 10*3/uL (ref 0.0–0.7)
Eosinophils Relative: 1 % (ref 0–5)
HEMATOCRIT: 45 % (ref 36.0–46.0)
Hemoglobin: 14.4 g/dL (ref 12.0–15.0)
LYMPHS PCT: 25 % (ref 12–46)
Lymphs Abs: 0.9 10*3/uL (ref 0.7–4.0)
MCH: 27.3 pg (ref 26.0–34.0)
MCHC: 32 g/dL (ref 30.0–36.0)
MCV: 85.4 fL (ref 78.0–100.0)
MONO ABS: 0.8 10*3/uL (ref 0.1–1.0)
Monocytes Relative: 22 % — ABNORMAL HIGH (ref 3–12)
NEUTROS ABS: 2 10*3/uL (ref 1.7–7.7)
Neutrophils Relative %: 51 % (ref 43–77)
Platelets: 383 10*3/uL (ref 150–400)
RBC: 5.27 MIL/uL — ABNORMAL HIGH (ref 3.87–5.11)
RDW: 16.9 % — ABNORMAL HIGH (ref 11.5–15.5)
WBC: 3.7 10*3/uL — ABNORMAL LOW (ref 4.0–10.5)

## 2015-01-14 LAB — TSH: TSH: 0.494 u[IU]/mL (ref 0.350–4.500)

## 2015-01-14 LAB — I-STAT TROPONIN, ED: Troponin i, poc: 0.01 ng/mL (ref 0.00–0.08)

## 2015-01-14 LAB — BRAIN NATRIURETIC PEPTIDE: B Natriuretic Peptide: 258 pg/mL — ABNORMAL HIGH (ref 0.0–100.0)

## 2015-01-14 LAB — T4, FREE: Free T4: 1.06 ng/dL (ref 0.61–1.12)

## 2015-01-14 LAB — TROPONIN I: Troponin I: <0.03 ng/mL (ref <0.031)

## 2015-01-14 MED ORDER — ALBUTEROL SULFATE (2.5 MG/3ML) 0.083% IN NEBU: 2.5000 mg | INHALATION_SOLUTION | RESPIRATORY_TRACT | Status: DC | PRN

## 2015-01-14 MED ORDER — ACETAMINOPHEN 650 MG RE SUPP
650.0000 mg | Freq: Four times a day (QID) | RECTAL | Status: DC | PRN
Start: 2015-01-14 — End: 2015-01-16

## 2015-01-14 MED ORDER — HEPARIN SODIUM (PORCINE) 5000 UNIT/ML IJ SOLN
5000.0000 [IU] | Freq: Three times a day (TID) | INTRAMUSCULAR | Status: DC
  Administered 2015-01-14 – 2015-01-16 (×5): 5000 [IU] via SUBCUTANEOUS
  Filled 2015-01-14 (×5): qty 1

## 2015-01-14 MED ORDER — ASPIRIN 81 MG PO CHEW
324.0000 mg | CHEWABLE_TABLET | Freq: Once | ORAL | Status: AC
  Administered 2015-01-14: 324 mg via ORAL
  Filled 2015-01-14: qty 4

## 2015-01-14 MED ORDER — ALBUTEROL SULFATE (2.5 MG/3ML) 0.083% IN NEBU: 2.5000 mg | INHALATION_SOLUTION | Freq: Four times a day (QID) | RESPIRATORY_TRACT | Status: DC

## 2015-01-14 MED ORDER — ONDANSETRON HCL 4 MG PO TABS: 4.0000 mg | ORAL_TABLET | Freq: Four times a day (QID) | ORAL | Status: DC | PRN

## 2015-01-14 MED ORDER — SODIUM CHLORIDE 0.9 % IJ SOLN: 3.0000 mL | Freq: Two times a day (BID) | INTRAMUSCULAR | Status: DC

## 2015-01-14 MED ORDER — IOHEXOL 350 MG/ML SOLN
100.0000 mL | Freq: Once | INTRAVENOUS | Status: AC | PRN
  Administered 2015-01-14: 100 mL via INTRAVENOUS

## 2015-01-14 MED ORDER — ASPIRIN EC 81 MG PO TBEC
81.0000 mg | DELAYED_RELEASE_TABLET | Freq: Every day | ORAL | Status: DC
  Administered 2015-01-15 – 2015-01-16 (×2): 81 mg via ORAL
  Filled 2015-01-14 (×2): qty 1

## 2015-01-14 MED ORDER — SODIUM CHLORIDE 0.9 % IV BOLUS (SEPSIS)
1000.0000 mL | Freq: Once | INTRAVENOUS | Status: AC
  Administered 2015-01-14: 1000 mL via INTRAVENOUS

## 2015-01-14 MED ORDER — METHIMAZOLE 5 MG PO TABS
2.5000 mg | ORAL_TABLET | Freq: Every day | ORAL | Status: DC
  Administered 2015-01-15 – 2015-01-16 (×2): 2.5 mg via ORAL
  Filled 2015-01-14 (×2): qty 1

## 2015-01-14 MED ORDER — IPRATROPIUM-ALBUTEROL 0.5-2.5 (3) MG/3ML IN SOLN
3.0000 mL | Freq: Three times a day (TID) | RESPIRATORY_TRACT | Status: DC
Start: 2015-01-15 — End: 2015-01-16
  Administered 2015-01-15 – 2015-01-16 (×2): 3 mL via RESPIRATORY_TRACT
  Filled 2015-01-14 (×5): qty 3

## 2015-01-14 MED ORDER — IPRATROPIUM-ALBUTEROL 0.5-2.5 (3) MG/3ML IN SOLN
3.0000 mL | Freq: Once | RESPIRATORY_TRACT | Status: AC
  Administered 2015-01-14: 3 mL via RESPIRATORY_TRACT
  Filled 2015-01-14: qty 3

## 2015-01-14 MED ORDER — HYDROCODONE-ACETAMINOPHEN 5-325 MG PO TABS: 1.0000 | ORAL_TABLET | ORAL | Status: DC | PRN

## 2015-01-14 MED ORDER — METHYLPREDNISOLONE SODIUM SUCC 125 MG IJ SOLR
125.0000 mg | Freq: Once | INTRAMUSCULAR | Status: AC
  Administered 2015-01-14: 125 mg via INTRAVENOUS
  Filled 2015-01-14: qty 2

## 2015-01-14 MED ORDER — METOPROLOL SUCCINATE ER 25 MG PO TB24
25.0000 mg | ORAL_TABLET | Freq: Every day | ORAL | Status: DC
  Administered 2015-01-14 – 2015-01-16 (×3): 25 mg via ORAL
  Filled 2015-01-14 (×3): qty 1

## 2015-01-14 MED ORDER — ACETAMINOPHEN 325 MG PO TABS: 650.0000 mg | ORAL_TABLET | Freq: Four times a day (QID) | ORAL | Status: DC | PRN

## 2015-01-14 MED ORDER — ONDANSETRON HCL 4 MG/2ML IJ SOLN: 4.0000 mg | Freq: Four times a day (QID) | INTRAMUSCULAR | Status: DC | PRN

## 2015-01-14 MED ORDER — ONDANSETRON HCL 4 MG/2ML IJ SOLN: 4.0000 mg | Freq: Three times a day (TID) | INTRAMUSCULAR | Status: DC | PRN

## 2015-01-14 MED ORDER — DOCUSATE SODIUM 100 MG PO CAPS
100.0000 mg | ORAL_CAPSULE | Freq: Two times a day (BID) | ORAL | Status: DC
  Administered 2015-01-14 – 2015-01-16 (×4): 100 mg via ORAL
  Filled 2015-01-14 (×4): qty 1

## 2015-01-14 MED ORDER — IPRATROPIUM-ALBUTEROL 0.5-2.5 (3) MG/3ML IN SOLN
3.0000 mL | Freq: Four times a day (QID) | RESPIRATORY_TRACT | Status: DC
  Administered 2015-01-14: 3 mL via RESPIRATORY_TRACT
  Filled 2015-01-14: qty 3

## 2015-01-14 MED ORDER — ENSURE ENLIVE PO LIQD
237.0000 mL | Freq: Two times a day (BID) | ORAL | Status: DC
  Administered 2015-01-15 – 2015-01-16 (×3): 237 mL via ORAL

## 2015-01-14 MED ORDER — IPRATROPIUM BROMIDE 0.02 % IN SOLN: 0.5000 mg | Freq: Four times a day (QID) | RESPIRATORY_TRACT | Status: DC

## 2015-01-14 MED ORDER — BISACODYL 10 MG RE SUPP: 10.0000 mg | Freq: Every day | RECTAL | Status: DC | PRN

## 2015-01-14 MED ORDER — PROCHLORPERAZINE MALEATE 10 MG PO TABS: 5.0000 mg | ORAL_TABLET | Freq: Four times a day (QID) | ORAL | Status: DC | PRN

## 2015-01-14 MED ORDER — POLYETHYLENE GLYCOL 3350 17 G PO PACK: 17.0000 g | PACK | Freq: Every day | ORAL | Status: DC | PRN

## 2015-01-14 MED ORDER — METHYLPREDNISOLONE SODIUM SUCC 125 MG IJ SOLR
60.0000 mg | Freq: Three times a day (TID) | INTRAMUSCULAR | Status: DC
  Administered 2015-01-14 – 2015-01-15 (×3): 60 mg via INTRAVENOUS
  Filled 2015-01-14 (×3): qty 2

## 2015-01-14 MED ORDER — SODIUM CHLORIDE 0.9 % IV SOLN
INTRAVENOUS | Status: DC
  Administered 2015-01-14 – 2015-01-16 (×4): via INTRAVENOUS

## 2015-01-14 MED ORDER — SENNA 8.6 MG PO TABS
1.0000 | ORAL_TABLET | Freq: Two times a day (BID) | ORAL | Status: DC
  Administered 2015-01-14 – 2015-01-16 (×4): 8.6 mg via ORAL
  Filled 2015-01-14 (×4): qty 1

## 2015-01-14 NOTE — ED Notes (Signed)
o2sat without oxygen decreased 78%, 2 liters applied sat 95%

## 2015-01-14 NOTE — Progress Notes (Signed)
Patient admitted to the unit 1840, oriented patient to the room unit, tele applied and patient resting in bed, reported off to night shift nurse to continue plan of care.

## 2015-01-14 NOTE — ED Notes (Signed)
Patient transported to X-ray 

## 2015-01-14 NOTE — ED Notes (Signed)
Bed: WA03 Expected date:  Expected time:  Means of arrival:  Comments: Hold for triage

## 2015-01-14 NOTE — H&P (Addendum)
Patient Demographics  Susan Garrett, is a 80 y.o. female  MRN: [id number]   DOB - 08-20-1934  Admit Date - 01/14/2015  Outpatient Primary MD for the patient is [name], MD   With History of -  Past Medical History  Diagnosis Date  . Hypertension   . Corns and callosities   . Callus   . Hyperthyroidism   . Encounter for antineoplastic chemotherapy 11/13/2014  . Encounter for antineoplastic chemotherapy 11/13/2014      Past Surgical History  Procedure Laterality Date  . Lesion removal Left 11/12/87  . Vaginal hysterectomy    . Video bronchoscopy Bilateral 10/18/2014    Procedure: VIDEO BRONCHOSCOPY WITH FLUORO;  Surgeon: Tanda Rockers, MD;  Location: [facility];  Service: Endoscopy;  Laterality: Bilateral;    in for   Chief Complaint  Patient presents with  . Palpitations    rapid HR x1 week  . Shortness of Breath    hypoxic at PCP appt this AM  . Fatigue    feeling generally unwell "for a while now", just finished chemo/radiation     HPI  Susan Garrett  is a 80 y.o. female, with past medical history of I per tension, hyperthyroidism, diagnosis of stage IIB unresectable non-small cell lung cancer, squamous cell carcinoma, undergoing concurrent chemoradiation (finished chemotherapy before 2 weeks, radiation last week), presents with complaints of palpitation, progressive weakness and dyspnea, reports her dyspnea is exertional, hypoxic in ED 78% on room air, Sinus tachycardia at 130s, CT chest angiogram negative for PE, but sig significant for severe emphysema, started on oxygen, Solu-Medrol, nebulizers, hospitalist requested to admit, patient denies cough, productive sputum, fever or chills, reports poor appetite, and weight loss.    Review of Systems    In addition to the HPI above,  No Fever-chills, No Headache, No changes with Vision or hearing, No problems swallowing food or Liquids, No Chest pain, Cough  reports progressiveShortness of Breath, No  Abdominal pain, No Nausea or Vommitting, Bowel movements are regular, No Blood in stool or Urine, No dysuria, No new skin rashes or bruises, No new joints pains-aches,  No new weakness, tingling, numbness in any extremity, but reports generalized weakness No recent weight gain , reports weight loss No polyuria, polydypsia or polyphagia, No significant Mental Stressors.  A full 10 point Review of Systems was done, except as stated above, all other Review of Systems were negative.   Social History History  Substance Use Topics  . Smoking status: Former Smoker -- 0.50 packs/day for 55 years    Types: Cigarettes    Quit date: 08/07/2014  . Smokeless tobacco: Never Used     Comment: smokes 0-4 cigs daily  . Alcohol Use: No     Family History Family History  Problem Relation Age of Onset  . Heart disease Father   . Asthma Father   . Cancer Mother     gastric  . Cancer Brother     lung     Prior to Admission medications   Medication Sig Start Date End Date Taking? Authorizing Provider  aspirin 81 MG tablet Take 81 mg by mouth daily.   Yes Historical Provider, MD  methimazole (TAPAZOLE) 5 MG tablet Take 2.5 mg by mouth daily.  11/18/14  Yes Historical Provider, MD  metoprolol succinate (TOPROL-XL) 25 MG 24 hr tablet Take 25 mg by mouth daily.  08/02/13  Yes Historical Provider, MD  prochlorperazine (COMPAZINE) 5 MG tablet Take 1 tablet (5 mg  total) by mouth every 6 (six) hours as needed for nausea or vomiting. 11/13/14  Yes Adrena E Johnson, PA-C  Wound Cleansers (RADIAPLEX EX) Apply 1 application topically daily as needed (when skin feels rough from radiation).    Yes Historical Provider, MD    Allergies  Allergen Reactions  . Shellfish Allergy     Physical Exam  Vitals  Blood pressure 145/88, pulse 116, temperature 97.9 F (36.6 C), temperature source Oral, resp. rate 22, SpO2 95 %.   1. General  frail, elderly, thin-appearing female lying in bed in NAD,    2.  Normal affect and insight, Not Suicidal or Homicidal, Awake Alert, Oriented X 3.  3. No F.N deficits, ALL C.Nerves Intact, Strength 5/5 all 4 extremities, Sensation intact all 4 extremities, Plantars down going.  4. Ears and Eyes appear Normal, Conjunctivae clear, PERRLA. Moist Oral Mucosa.  5. Supple Neck, No JVD, No cervical lymphadenopathy appriciated, No Carotid Bruits.  6. Symmetrical Chest wall movement, Good air movement bilaterally, CTAB.  7. RRR, No Gallops, Rubs or Murmurs, No Parasternal Heave.  8. Positive Bowel Sounds, Abdomen Soft, No tenderness, No organomegaly appriciated,No rebound -guarding or rigidity.  9.  No Cyanosis, Normal Skin Turgor, No Skin Rash or Bruise.  10. Good muscle tone,  joints appear normal , no effusions, Normal ROM.    Data Review  CBC  Recent Labs Lab 01/14/15 1306  WBC 3.7*  HGB 14.4  HCT 45.0  PLT 383  MCV 85.4  MCH 27.3  MCHC 32.0  RDW 16.9*  LYMPHSABS 0.9  MONOABS 0.8  EOSABS 0.0  BASOSABS 0.0   ------------------------------------------------------------------------------------------------------------------  Chemistries   Recent Labs Lab 01/14/15 1306  NA 136  K 4.1  CL 93*  CO2 34*  GLUCOSE 134*  BUN 15  CREATININE 0.79  CALCIUM 9.7   ------------------------------------------------------------------------------------------------------------------ estimated creatinine clearance is 42 mL/min (by C-G formula based on Cr of 0.79). ------------------------------------------------------------------------------------------------------------------  Recent Labs  01/14/15 1344  TSH 0.494     Coagulation profile No results for input(s): INR, PROTIME in the last 168 hours. ------------------------------------------------------------------------------------------------------------------- No results for input(s): DDIMER in the last 72  hours. -------------------------------------------------------------------------------------------------------------------  Cardiac Enzymes  Recent Labs Lab 01/14/15 1612  TROPONINI <0.03   ------------------------------------------------------------------------------------------------------------------ Invalid input(s): POCBNP   ---------------------------------------------------------------------------------------------------------------  Urinalysis    Component Value Date/Time   LABSPEC 1.020 11/26/2014 1430   PHURINE 6.0 11/26/2014 1430   GLUCOSEU Negative 11/26/2014 1430   HGBUR Negative 11/26/2014 1430   BILIRUBINUR Negative 11/26/2014 1430   KETONESUR Negative 11/26/2014 1430   PROTEINUR Negative 11/26/2014 1430   UROBILINOGEN 0.2 11/26/2014 1430   NITRITE Negative 11/26/2014 1430   LEUKOCYTESUR Negative 11/26/2014 1430    ----------------------------------------------------------------------------------------------------------------  Imaging results:   Dg Chest 2 View  01/14/2015   CLINICAL DATA:  Shortness breath. Palpitations. Hypoxia. Right lung cancer.  EXAM: CHEST  2 VIEW  COMPARISON:  10/18/2014 and chest CT dated 10/11/2014  FINDINGS: The mass in the right upper lobe has almost completely resolved with some slight residual streaky haziness in the area of the previously demonstrated tumor. The lungs are hyperinflated with flattening of the diaphragm consistent with COPD. No acute infiltrates or effusions.  No acute osseous abnormality.  IMPRESSION: Emphysema. Marked reduction in the tumor in the right upper lobe since the prior study.   Electronically Signed   By: Lorriane Shire M.D.   On: 01/14/2015 13:53   Ct Angio Chest Pe W/cm &/or Wo Cm  01/14/2015   CLINICAL DATA:  O'clock see a, shortness of breath, tachycardia and history of lung carcinoma.  EXAM: CT ANGIOGRAPHY CHEST WITH CONTRAST  TECHNIQUE: Multidetector CT imaging of the chest was performed using the  standard protocol during bolus administration of intravenous contrast. Multiplanar CT image reconstructions and MIPs were obtained to evaluate the vascular anatomy.  CONTRAST:  158m OMNIPAQUE IOHEXOL 350 MG/ML SOLN  COMPARISON:  Chest x-ray earlier today, PET scan on 11/01/2014 and CT of the chest, abdomen and pelvis on 10/11/2014.  FINDINGS: The pulmonary arteries are well opacified. There is no evidence of pulmonary embolism. There is diminishment in size of a right upper lobe pulmonary mass with retraction of tissues and surrounding parenchymal opacity present consistent with radiation therapy changes. At the level of the original 5 cm tumor, residual soft tissue measures roughly 1.5 x 3.0 cm. It is difficult to determine whether there is active disease in this region. There is no evidence of airway obstruction, pneumothorax, pulmonary edema or focal infiltrate.  The lungs again demonstrate advanced emphysematous disease. No enlarged lymph nodes are identified. The heart size is normal. No pleural or pericardial fluid is seen. Bony structures show no lesions or fractures.  Review of the MIP images confirms the above findings.  IMPRESSION: No evidence of pulmonary embolism. Changes in the right upper lobe related to treatment of a right upper lung carcinoma with radiation. Scarring and surrounding parenchymal changes are identified with diminishment in size of the primary tumor.   Electronically Signed   By: GAletta EdouardM.D.   On: 01/14/2015 16:53    My personal review of EKG: Rhythm NSR, Rate  113 /min, QTc 618 , no Acute ST changes    Assessment & Plan  Active Problems:   Hypoxia   COPD exacerbation   Acute respiratory failure   HTN (hypertension)   Hyperthyroidism   Protein calorie malnutrition    Acute hypoxic respiratory failure  - Appears to be multifactorial, but most contributing factor he appears to be acute COPD exacerbation , has baseline dyspnea secondary to lung cancer,  generalized weakness. - Continue with oxygen as needed, pulmonary toilet, and nebulizers - Consult case manager as very likely will need home oxygen  COPD exacerbation - Start on Solu-Medrol, DuoNeb every 6 hours, when necessary albuterol  Protein calorie malnutrition - Consult nutritional service, continue supplement  Hypertension - Continue with home medication  Hyperthyroidism  - Continue with methimazole, TSH and free T4 within normal limits   Sinus tachycardia - CT chest angina negative for PE, again due to dehydration - We'll continue with IV fluids  proLonged QTc - Monitor on telemetry, recheck EKG in a.m., monitor electrolytes and replace if needed  DVT Prophylaxis Heparin -  AM Labs Ordered, also please review Full Orders  Family Communication: Admission, patients condition and plan of care including tests being ordered have been discussed with the patient and sister who indicate understanding and agree with the plan and Code Status ( patient reports her health care power of attorney is her niece who is OB/GYN physician)  Code Status full code (but does not wish to be sustained on life support if vegetable state )  Likely DC to  home once stable   Condition GUARDED    Time spent in minutes : 60 minutes     Favour Aleshire M.D on 01/14/2015 at 6:06 PM  Between 7am to 7pm - Pager - [phone] After 7pm go to [email or web address] - password [email or web address]  And look for the night coverage  person covering me after hours  [facility] Office  [phone]

## 2015-01-14 NOTE — ED Notes (Signed)
Pt A+Ox4, reports c/o general malaise, "heart racing" and SOB, pt vague, poor historian, unable to say when exactly symptoms started.  Pt reports was seen by PCP this morning "because I was just feeling so bad" and was told "my oxygen was low" and was sent to ER. Speaking full/clear sentences, rr even/un-lab, though does appear winded.  Rapid pulse, 130s.  Pt denies CP.  Pt also reports "thyroid issue that they wanted you to look at".  Skin pale/w/d.  MAEI.  Neuros grossly intact.  Changed to hospital gown, placed to cardiac/02 monitor.  EKG obtained.  NAD.

## 2015-01-14 NOTE — ED Provider Notes (Signed)
CSN: [id number]     Arrival date & time 01/14/15  1213 History   First MD Initiated Contact with Patient 01/14/15 1252     Chief Complaint  Patient presents with  . Palpitations    rapid HR x1 week  . Shortness of Breath    hypoxic at PCP appt this AM  . Fatigue    feeling generally unwell "for a while now", just finished chemo/radiation     (Consider location/radiation/quality/duration/timing/severity/associated sxs/prior Treatment) HPI Comments: 80 year old female with history of COPD, lung cancer currently on chemotherapy and radiation, HTN who p/w shortness of breath, heart palpitations, and malaise. Patient states that she has had multiple days of worsening malaise, shortness of breath with minimal exertion, and heart racing sensation. She is barely able to brush her teeth without becoming exhausted. She denies any associated chest pain. She saw her PCP this morning had noted low oxygen saturation and sent her here for evaluation. He was also concerned about her thyroid. Patient denies any cough, vomiting, diarrhea, abdominal pain, or fevers.  Patient is a 80 y.o. female presenting with palpitations and shortness of breath. The history is provided by the patient and a relative.  Palpitations Associated symptoms: shortness of breath   Shortness of Breath   Past Medical History  Diagnosis Date  . Hypertension   . Corns and callosities   . Callus   . Hyperthyroidism   . Encounter for antineoplastic chemotherapy 11/13/2014  . Encounter for antineoplastic chemotherapy 11/13/2014   Past Surgical History  Procedure Laterality Date  . Lesion removal Left 11/12/87  . Vaginal hysterectomy    . Video bronchoscopy Bilateral 10/18/2014    Procedure: VIDEO BRONCHOSCOPY WITH FLUORO;  Surgeon: Tanda Rockers, MD;  Location: [facility];  Service: Endoscopy;  Laterality: Bilateral;   Family History  Problem Relation Age of Onset  . Heart disease Father   . Asthma Father   . Cancer Mother      gastric  . Cancer Brother     lung   History  Substance Use Topics  . Smoking status: Former Smoker -- 0.50 packs/day for 55 years    Types: Cigarettes    Quit date: 08/07/2014  . Smokeless tobacco: Never Used     Comment: smokes 0-4 cigs daily  . Alcohol Use: No   OB History    No data available     Review of Systems  Respiratory: Positive for shortness of breath.   Cardiovascular: Positive for palpitations.   10 Systems reviewed and are negative for acute change except as noted in the HPI.    Allergies  Shellfish allergy  Home Medications   Prior to Admission medications   Medication Sig Start Date End Date Taking? Authorizing Provider  aspirin 81 MG tablet Take 81 mg by mouth daily.   Yes Historical Provider, MD  methimazole (TAPAZOLE) 5 MG tablet Take 2.5 mg by mouth daily.  11/18/14  Yes Historical Provider, MD  metoprolol succinate (TOPROL-XL) 25 MG 24 hr tablet Take 25 mg by mouth daily.  08/02/13  Yes Historical Provider, MD  prochlorperazine (COMPAZINE) 5 MG tablet Take 1 tablet (5 mg total) by mouth every 6 (six) hours as needed for nausea or vomiting. 11/13/14  Yes Adrena E Johnson, PA-C  Wound Cleansers (RADIAPLEX EX) Apply 1 application topically daily as needed (when skin feels rough from radiation).    Yes Historical Provider, MD  ALPRAZolam Duanne Moron) 0.5 MG tablet Take 1 tab one hour before procedure, take second tab  if needed Patient not taking: Reported on 01/03/2015 10/23/14   Tanda Rockers, MD  methylPREDNISolone (MEDROL DOSEPAK) 4 MG TBPK tablet Use as instructed. Take with food Patient not taking: Reported on 01/03/2015 11/27/14   Burnetta Sabin E Johnson, PA-C   BP 145/88 mmHg  Pulse 116  Temp(Src) 97.9 F (36.6 C) (Oral)  Resp 22  SpO2 95% Physical Exam  Constitutional: She is oriented to person, place, and time. She appears well-developed and well-nourished. No distress.  Thin, frail appearing woman in NAD  HENT:  Head: Normocephalic and atraumatic.  Moist  mucous membranes  Eyes: Conjunctivae are normal. Pupils are equal, round, and reactive to light.  Neck: Neck supple.  Cardiovascular: Regular rhythm and normal heart sounds.   No murmur heard. tachycardic  Pulmonary/Chest:  Mildly increased work of breathing with diminished breath sounds bilateral bases, no respiratory distress, no wheezing or rales  Abdominal: Soft. Bowel sounds are normal. She exhibits no distension. There is no tenderness.  Musculoskeletal: She exhibits no edema.  Neurological: She is alert and oriented to person, place, and time.  Fluent speech  Skin: Skin is warm and dry.  Psychiatric: She has a normal mood and affect. Judgment normal.  Nursing note and vitals reviewed.   ED Course  Procedures (including critical care time) Labs Review Labs Reviewed  BASIC METABOLIC PANEL - Abnormal; Notable for the following:    Chloride 93 (*)    CO2 34 (*)    Glucose, Bld 134 (*)    All other components within normal limits  BRAIN NATRIURETIC PEPTIDE - Abnormal; Notable for the following:    B Natriuretic Peptide 258.0 (*)    All other components within normal limits  CBC WITH DIFFERENTIAL/PLATELET - Abnormal; Notable for the following:    WBC 3.7 (*)    RBC 5.27 (*)    RDW 16.9 (*)    Monocytes Relative 22 (*)    All other components within normal limits  T4, FREE  TSH  TROPONIN I  I-STAT TROPOININ, ED    Imaging Review Dg Chest 2 View  01/14/2015   CLINICAL DATA:  Shortness breath. Palpitations. Hypoxia. Right lung cancer.  EXAM: CHEST  2 VIEW  COMPARISON:  10/18/2014 and chest CT dated 10/11/2014  FINDINGS: The mass in the right upper lobe has almost completely resolved with some slight residual streaky haziness in the area of the previously demonstrated tumor. The lungs are hyperinflated with flattening of the diaphragm consistent with COPD. No acute infiltrates or effusions.  No acute osseous abnormality.  IMPRESSION: Emphysema. Marked reduction in the tumor in  the right upper lobe since the prior study.   Electronically Signed   By: Lorriane Shire M.D.   On: 01/14/2015 13:53   Ct Angio Chest Pe W/cm &/or Wo Cm  01/14/2015   CLINICAL DATA:  O'clock see a, shortness of breath, tachycardia and history of lung carcinoma.  EXAM: CT ANGIOGRAPHY CHEST WITH CONTRAST  TECHNIQUE: Multidetector CT imaging of the chest was performed using the standard protocol during bolus administration of intravenous contrast. Multiplanar CT image reconstructions and MIPs were obtained to evaluate the vascular anatomy.  CONTRAST:  19m OMNIPAQUE IOHEXOL 350 MG/ML SOLN  COMPARISON:  Chest x-ray earlier today, PET scan on 11/01/2014 and CT of the chest, abdomen and pelvis on 10/11/2014.  FINDINGS: The pulmonary arteries are well opacified. There is no evidence of pulmonary embolism. There is diminishment in size of a right upper lobe pulmonary mass with retraction of tissues and  surrounding parenchymal opacity present consistent with radiation therapy changes. At the level of the original 5 cm tumor, residual soft tissue measures roughly 1.5 x 3.0 cm. It is difficult to determine whether there is active disease in this region. There is no evidence of airway obstruction, pneumothorax, pulmonary edema or focal infiltrate.  The lungs again demonstrate advanced emphysematous disease. No enlarged lymph nodes are identified. The heart size is normal. No pleural or pericardial fluid is seen. Bony structures show no lesions or fractures.  Review of the MIP images confirms the above findings.  IMPRESSION: No evidence of pulmonary embolism. Changes in the right upper lobe related to treatment of a right upper lung carcinoma with radiation. Scarring and surrounding parenchymal changes are identified with diminishment in size of the primary tumor.   Electronically Signed   By: Aletta Edouard M.D.   On: 01/14/2015 16:53     EKG Interpretation   Date/Time:  Monday January 14 2015 14:06:14 EDT Ventricular  Rate:  113 PR Interval:  108 QRS Duration: 84 QT Interval:  451 QTC Calculation: 618 R Axis:   58 Text Interpretation:  Sinus tachycardia Biatrial enlargement LVH with  secondary repolarization abnormality ST depr, consider ischemia, inferior  leads Prolonged QT interval Artifact in lead(s) II aVR aVL aVF V1 V2 V3  Confirmed by Sofiah Lyne MD, Tishara Pizano ([id number]) on 01/14/2015 3:40:35 PM      MDM   Final diagnoses:  None   80yo F with past medical history including lung cancer who presents with shortness of breath and palpitations for the past several days. At presentation, patient mildly dyspneic but nontoxic in appearance. Vital signs notable for tachycardia at 136, O2 sat 94% on 2 L. Patient not normally on oxygen at home. Initial EKG with lateral T-wave inversions concerning for ischemia; repeat EKG later was unchanged. Obtained lab work above which showed a normal initial troponin, stable white blood cell count, and no other abnormalities to explain the patient's symptoms. Chest x-ray was unremarkable. Given patient's history of cancer and sudden onset of symptoms, obtained a CT of the chest to rule out PE. Gave the patient an IV fluid bolus which improved her tachycardia. Also gave the patient a DuoNeb.  CT shows no PE. Because of the patient's history of COPD and her current oxygen requirement, I have given her IV Solu-Medrol. She has a new oxygen requirement of unclear etiology and will be admitted for further workup.  Sharlett Iles, MD 01/14/15 [id number]

## 2015-01-15 DIAGNOSIS — E0590 Thyrotoxicosis, unspecified without thyrotoxic crisis or storm: Secondary | ICD-10-CM

## 2015-01-15 DIAGNOSIS — E43 Unspecified severe protein-calorie malnutrition: Secondary | ICD-10-CM | POA: Insufficient documentation

## 2015-01-15 DIAGNOSIS — J441 Chronic obstructive pulmonary disease with (acute) exacerbation: Principal | ICD-10-CM

## 2015-01-15 DIAGNOSIS — C3490 Malignant neoplasm of unspecified part of unspecified bronchus or lung: Secondary | ICD-10-CM

## 2015-01-15 DIAGNOSIS — E46 Unspecified protein-calorie malnutrition: Secondary | ICD-10-CM

## 2015-01-15 DIAGNOSIS — D72819 Decreased white blood cell count, unspecified: Secondary | ICD-10-CM

## 2015-01-15 LAB — CBC
HCT: 42.8 % (ref 36.0–46.0)
Hemoglobin: 13.4 g/dL (ref 12.0–15.0)
MCH: 27 pg (ref 26.0–34.0)
MCHC: 31.3 g/dL (ref 30.0–36.0)
MCV: 86.3 fL (ref 78.0–100.0)
Platelets: 353 10*3/uL (ref 150–400)
RBC: 4.96 MIL/uL (ref 3.87–5.11)
RDW: 16.9 % — ABNORMAL HIGH (ref 11.5–15.5)
WBC: 3 10*3/uL — AB (ref 4.0–10.5)

## 2015-01-15 LAB — TROPONIN I

## 2015-01-15 LAB — BASIC METABOLIC PANEL
Anion gap: 7 (ref 5–15)
BUN: 12 mg/dL (ref 6–20)
CALCIUM: 8.8 mg/dL — AB (ref 8.9–10.3)
CHLORIDE: 101 mmol/L (ref 101–111)
CO2: 31 mmol/L (ref 22–32)
Creatinine, Ser: 0.61 mg/dL (ref 0.44–1.00)
GFR calc Af Amer: >60 mL/min (ref >60)
GFR calc non Af Amer: >60 mL/min (ref >60)
Glucose, Bld: 198 mg/dL — ABNORMAL HIGH (ref 65–99)
Potassium: 4.1 mmol/L (ref 3.5–5.1)
Sodium: 139 mmol/L (ref 135–145)

## 2015-01-15 MED ORDER — METHYLPREDNISOLONE SODIUM SUCC 40 MG IJ SOLR
40.0000 mg | Freq: Two times a day (BID) | INTRAMUSCULAR | Status: DC
  Administered 2015-01-15 – 2015-01-16 (×2): 40 mg via INTRAVENOUS
  Filled 2015-01-15 (×2): qty 1

## 2015-01-15 NOTE — Evaluation (Signed)
Physical Therapy Evaluation Patient Details Name: Susan Garrett MRN: [id number] DOB: 12-05-1934 Today's Date: 01/15/2015   History of Present Illness  80 y.o. female, with past medical history of hypertension, hyperthyroidism, diagnosis of stage IIB unresectable non-small cell lung cancer, squamous cell carcinoma, undergoing concurrent chemoradiation (finished chemotherapy before 2 weeks, radiation last week) admitted for Acute hypoxic respiratory failure  and COPD exacerbation.  Clinical Impression  Pt admitted with above diagnosis. Pt currently with functional limitations due to the deficits listed below (see PT Problem List).  Pt will benefit from skilled PT to increase their independence and safety with mobility to allow discharge to the venue listed below.  Pt reports she can have assist at home "when I need it" and vague about equipment however states she has husbands leftover equipment.  Pt also appears distraught with mention of spouse. (seems he has passed).  Pt reluctantly agreeable to ambulate while checking saturations.  Pt states she does not want to have to use oxygen at home even after explanations and hopeful to improve to d/c home without O2 needs.     Follow Up Recommendations Home health PT    Equipment Recommendations  None recommended by PT    Recommendations for Other Services       Precautions / Restrictions Precautions Precautions: Fall Precaution Comments: monitor sats      Mobility  Bed Mobility Overal bed mobility: Modified Independent             General bed mobility comments: HOB elevated  Transfers Overall transfer level: Needs assistance Equipment used: None Transfers: Sit to/from Stand Sit to Stand: Min guard         General transfer comment: min/guard for safety  Ambulation/Gait Ambulation/Gait assistance: Min guard Ambulation Distance (Feet): 120 Feet Assistive device: None Gait Pattern/deviations: Step-through pattern;Narrow base  of support;Decreased stride length     General Gait Details: very slow pace, pushed IV pole, ambulated on 2L O2 with SPO2 91-92%, pt denies SOB  Stairs            Wheelchair Mobility    Modified Rankin (Stroke Patients Only)       Balance Overall balance assessment: Needs assistance         Standing balance support: No upper extremity supported Standing balance-Leahy Scale: Fair                               Pertinent Vitals/Pain Pain Assessment: No/denies pain    Home Living Family/patient expects to be discharged to:: Private residence Living Arrangements: Alone Available Help at Discharge: Family;Available PRN/intermittently Type of Home: House       Home Layout: Multi-level Home Equipment: Walker - 2 wheels Additional Comments: states she has "equipment" from when spouse needed it (he has passed and pt becomes distraught with subject)    Prior Function Level of Independence: Independent               Hand Dominance        Extremity/Trunk Assessment               Lower Extremity Assessment: Generalized weakness (diffuse muscle atrophy)         Communication   Communication: No difficulties  Cognition Arousal/Alertness: Awake/alert Behavior During Therapy: WFL for tasks assessed/performed Overall Cognitive Status: Within Functional Limits for tasks assessed  General Comments      Exercises        Assessment/Plan    PT Assessment Patient needs continued PT services  PT Diagnosis Difficulty walking;Generalized weakness   PT Problem List Decreased activity tolerance;Decreased mobility;Decreased strength;Cardiopulmonary status limiting activity  PT Treatment Interventions DME instruction;Gait training;Functional mobility training;Patient/family education;Stair training;Therapeutic activities;Therapeutic exercise   PT Goals (Current goals can be found in the Care Plan section) Acute  Rehab PT Goals PT Goal Formulation: With patient Time For Goal Achievement: 01/29/15 Potential to Achieve Goals: Good    Frequency Min 3X/week   Barriers to discharge        Co-evaluation               End of Session Equipment Utilized During Treatment: Gait belt;Oxygen Activity Tolerance: Patient tolerated treatment well Patient left: in bed;with call bell/phone within reach;with bed alarm set           Time: 7011-0034 PT Time Calculation (min) (ACUTE ONLY): 24 min   Charges:   PT Evaluation $Initial PT Evaluation Tier I: 1 Procedure     PT G Codes:        Tayley Mudrick,KATHrine E 01/15/2015, 1:09 PM Carmelia Bake, PT, DPT 01/15/2015 Pager: [phone]

## 2015-01-15 NOTE — Progress Notes (Signed)
Initial Nutrition Assessment  DOCUMENTATION CODES:   Severe malnutrition in context of chronic illness, Underweight  INTERVENTION:  - Continue Ensure Enlive BID, each supplement provides 350 kcal and 20 grams of protein - Will order [employer] Breakfast once/day - RD will continue to monitor for needs  NUTRITION DIAGNOSIS:   Increased nutrient needs related to catabolic illness, cancer and cancer related treatments as evidenced by estimated needs.  GOAL:   Patient will meet greater than or equal to 90% of their needs  MONITOR:   PO intake, Supplement acceptance, Weight trends, Labs  REASON FOR ASSESSMENT:   Consult Poor PO  ASSESSMENT:   80 y.o. female, with past medical history of I per tension, hyperthyroidism, diagnosis of stage IIB unresectable non-small cell lung cancer, squamous cell carcinoma, undergoing concurrent chemoradiation (finished chemotherapy before 2 weeks, radiation last week), presents with complaints of palpitation, progressive weakness and dyspnea, reports her dyspnea is exertional, hypoxic in ED 78% on room air, Sinus tachycardia at 130s, CT chest angiogram negative for PE, but sig significant for severe emphysema, started on oxygen, Solu-Medrol, nebulizers, hospitalist requested to admit.  Pt seen for consult. BMI indicates underweight status. Pt states she ate a few bites of pineapple, most of [place] toast and [place] sausage, and drank cranberry juice and coffee for breakfast. No intakes documented since admission.  She states poor appetite PTA which she relates to being caused by chemo and radiation. She states she is now s/p chemo and radiation. At home, her sisters and nieces cooked for her and delivered the food to her house but she often did not feel hungry enough to eat much. She was drinking Ensure, Boost, and [employer] and is interested in receiving these supplements during admission.    Pt states that weight has been  fairly stable recently. Weight hx review shows 6 lb weight loss (6% body weight) in the past 1.5 months which is significant for time frame. Severe muscle and fat wasting noted to all areas.  Not meeting needs. Medications reviewed. Labs reviewed.  Diet Order:  Diet Heart Room service appropriate?: Yes; Fluid consistency:: Thin  Skin:  Reviewed, no issues  Last BM:  8/8  Height:   Ht Readings from Last 1 Encounters:  01/14/15 '5\' 4"'$  (1.626 m)    Weight:   Wt Readings from Last 1 Encounters:  01/14/15 100 lb 8.5 oz (45.6 kg)    Ideal Body Weight:  54.54 kg (kg)  BMI:  Body mass index is 17.25 kg/(m^2).  Estimated Nutritional Needs:   Kcal:  9163-8466  Protein:  68-80 grams  Fluid:  2 L/day  EDUCATION NEEDS:   No education needs identified at this time    Jarome Matin, RD, LDN Inpatient Clinical Dietitian Pager # [phone] After hours/weekend pager # [phone]

## 2015-01-15 NOTE — Progress Notes (Signed)
Susan Garrett   DOB:Aug 18, 1934   YW#:[id number]   SWN#:[id number]  Patient Care Team: [name], MD as PCP - General (Endocrinology)  Subjective: Susan Garrett is a 80 year old woman with a history of IIB non-small cell lung cancer, squamous cell carcinoma as detailed below on concurrent chemoradiation, admitted on 8/8 due to COPD exacerbation with acute hypoxic respiratory failure. CT angio was negative for PE or acute findings. She was placed on supportive therapy with oxygen, nebs and pulmonary toilet with good response. She is feeling better today.  Denies fevers, chills night sweats, vision changes, or mucositis.  Denies any chest pain or palpitations. Denies lower extremity swelling. Denies nausea, heartburn or change in bowel habits. Denies any dysuria. Denies abnormal skin rashes, or neuropathy. Denies any bleeding issues such as epistaxis, hematemesis, hematuria or hematochezia. Ambulating without difficulty.  DIAGNOSIS: Primary cancer of right lung  Staging form: Lung, AJCC 7th Edition  Clinical stage from 11/01/2014: Stage IIB (T2b, N1, M0) - Signed by Curt Bears, MD on 11/03/2014  PRIOR THERAPY: None  CURRENT THERAPY: Course of concurrent chemoradiation with chemotherapy in the form of weekly carboplatin for an AUC of 2 and paclitaxel 45 mg/m given concurrent with radiation for 6-7 weeks. First cycle started 11/19/2014   Scheduled Meds: . aspirin EC  81 mg Oral Daily  . docusate sodium  100 mg Oral BID  . feeding supplement (ENSURE ENLIVE)  237 mL Oral BID BM  . heparin  5,000 Units Subcutaneous 3 times per day  . ipratropium-albuterol  3 mL Nebulization TID  . methimazole  2.5 mg Oral Daily  . methylPREDNISolone (SOLU-MEDROL) injection  40 mg Intravenous BID  . metoprolol succinate  25 mg Oral Daily  . senna  1 tablet Oral BID  . sodium chloride  3 mL Intravenous Q12H   Continuous Infusions: . sodium chloride 75 mL/hr at 01/15/15 0504   PRN Meds:.acetaminophen  **OR** acetaminophen, albuterol, bisacodyl, HYDROcodone-acetaminophen, ondansetron **OR** ondansetron (ZOFRAN) IV, polyethylene glycol, prochlorperazine  Objective:  Filed Vitals:   01/15/15 1347  BP: 147/92  Pulse: 121  Temp: 97.6 F (36.4 C)  Resp: 21      Intake/Output Summary (Last 24 hours) at 01/15/15 1620 Last data filed at 01/15/15 1451  Gross per 24 hour  Intake 1822.5 ml  Output      3 ml  Net 1819.5 ml      GENERAL: alert, no distress and comfortable, she is thin, frail SKIN: skin color, texture, turgor are normal, no rashes or significant lesions EYES: normal, conjunctiva are pink and non-injected, sclera clear OROPHARYNX: no exudate, no erythema and lips, buccal mucosa, and tongue normal  NECK: supple, thyroid normal size, non-tender, without nodularity LYMPH:  no palpable lymphadenopathy in the cervical, axillary or inguinal LUNGS: clear to auscultation and percussion with normal breathing effort HEART: regular rate & rhythm and no murmurs and no lower extremity edema ABDOMEN: soft, non-tender and normal bowel sounds Musculoskeletal: no cyanosis of digits and no clubbing  PSYCH: alert & oriented x 3 with fluent speech NEURO: no focal motor/sensory deficits    CBG (last 3)  No results for input(s): GLUCAP in the last 72 hours.   Labs:   Recent Labs Lab 01/14/15 1306 01/15/15 0515  WBC 3.7* 3.0*  HGB 14.4 13.4  HCT 45.0 42.8  PLT 383 353  MCV 85.4 86.3  MCH 27.3 27.0  MCHC 32.0 31.3  RDW 16.9* 16.9*  LYMPHSABS 0.9  --   MONOABS 0.8  --  EOSABS 0.0  --   BASOSABS 0.0  --      Chemistries:    Recent Labs Lab 01/14/15 1306 01/15/15 0515  NA 136 139  K 4.1 4.1  CL 93* 101  CO2 34* 31  GLUCOSE 134* 198*  BUN 15 12  CREATININE 0.79 0.61  CALCIUM 9.7 8.8*    GFR Estimated Creatinine Clearance: 41.7 mL/min (by C-G formula based on Cr of 0.61).  Liver Function Tests: No results for input(s): AST, ALT, ALKPHOS, BILITOT, PROT,  ALBUMIN in the last 168 hours. No results for input(s): LIPASE, AMYLASE in the last 168 hours. No results for input(s): AMMONIA in the last 168 hours.  Urine Studies     Component Value Date/Time   LABSPEC 1.020 11/26/2014 1430   PHURINE 6.0 11/26/2014 1430   GLUCOSEU Negative 11/26/2014 1430   HGBUR Negative 11/26/2014 1430   BILIRUBINUR Negative 11/26/2014 1430   KETONESUR Negative 11/26/2014 1430   PROTEINUR Negative 11/26/2014 1430   UROBILINOGEN 0.2 11/26/2014 1430   NITRITE Negative 11/26/2014 1430   LEUKOCYTESUR Negative 11/26/2014 1430     Recent Labs Lab 01/14/15 1612 01/15/15 1435  TROPONINI <0.03 <0.03    Thyroid function studies  Recent Labs  01/14/15 1344  TSH 0.494   Microbiology No results found for this or any previous visit (from the past 240 hour(s)).     Imaging Studies:  Dg Chest 2 View  01/14/2015   CLINICAL DATA:  Shortness breath. Palpitations. Hypoxia. Right lung cancer.  EXAM: CHEST  2 VIEW  COMPARISON:  10/18/2014 and chest CT dated 10/11/2014  FINDINGS: The mass in the right upper lobe has almost completely resolved with some slight residual streaky haziness in the area of the previously demonstrated tumor. The lungs are hyperinflated with flattening of the diaphragm consistent with COPD. No acute infiltrates or effusions.  No acute osseous abnormality.  IMPRESSION: Emphysema. Marked reduction in the tumor in the right upper lobe since the prior study.   Electronically Signed   By: Lorriane Shire M.D.   On: 01/14/2015 13:53   Ct Angio Chest Pe W/cm &/or Wo Cm  01/14/2015   CLINICAL DATA:  O'clock see a, shortness of breath, tachycardia and history of lung carcinoma.  EXAM: CT ANGIOGRAPHY CHEST WITH CONTRAST  TECHNIQUE: Multidetector CT imaging of the chest was performed using the standard protocol during bolus administration of intravenous contrast. Multiplanar CT image reconstructions and MIPs were obtained to evaluate the vascular anatomy.   CONTRAST:  149m OMNIPAQUE IOHEXOL 350 MG/ML SOLN  COMPARISON:  Chest x-ray earlier today, PET scan on 11/01/2014 and CT of the chest, abdomen and pelvis on 10/11/2014.  FINDINGS: The pulmonary arteries are well opacified. There is no evidence of pulmonary embolism. There is diminishment in size of a right upper lobe pulmonary mass with retraction of tissues and surrounding parenchymal opacity present consistent with radiation therapy changes. At the level of the original 5 cm tumor, residual soft tissue measures roughly 1.5 x 3.0 cm. It is difficult to determine whether there is active disease in this region. There is no evidence of airway obstruction, pneumothorax, pulmonary edema or focal infiltrate.  The lungs again demonstrate advanced emphysematous disease. No enlarged lymph nodes are identified. The heart size is normal. No pleural or pericardial fluid is seen. Bony structures show no lesions or fractures.  Review of the MIP images confirms the above findings.  IMPRESSION: No evidence of pulmonary embolism. Changes in the right upper lobe related to treatment of  a right upper lung carcinoma with radiation. Scarring and surrounding parenchymal changes are identified with diminishment in size of the primary tumor.   Electronically Signed   By: Aletta Edouard M.D.   On: 01/14/2015 16:53    Assessment/Plan: 80 y.o.   COPD exacerbation acute hypoxic respiratory failure.  CT angio was negative for PE or acute findings.  She was placed on supportive therapy with oxygen, nebs and pulmonary toilet with good response.  She is feeling better today.  Appreciate primary team's involvement   IIB non-small cell lung cancer, squamous cell carcinoma  on concurrent chemoradiation with chemotherapy in the form of weekly carboplatin for an AUC of 2 and paclitaxel 45 mg/m S/p cycle 7 D1 01/07/15  Leukopenia Due to recent chemotherapy No intervention indicated at this time Continue to closely  monitor  Malnutrition Appreciate Nutrition evaluation  Deconditioning Consider OT/PT   Full Code   Other medical issues including tachycardia, abnormal EKG  as per admitting team     [facility] E, PA-C 01/15/2015  4:20 PM  ADDENDUM: Hematology/Oncology Attending: The patient is seen and examined today. I agree with the above note. She is a very pleasant 80 years old African-American female with unresectable a stage IIB non-small cell lung cancer status post course of concurrent chemoradiation with weekly carboplatin and paclitaxel. She completed her treatment 2 weeks ago. The patient was admitted with worsening dyspnea and a diagnosis of COPD exacerbation. She is feeling much better after starting treatment with steroids and nebulizer. CT angiogram of the chest performed this admission showed no evidence for pulmonary embolism and there was some improvement of her disease. I agree with the current treatment plan with steroids and nebulizer. She would have a follow-up appointment with me at the [place] infusion weeks for reevaluation of her disease. Thank you so much for taking good care of Susan. Hoppes, I will continue to follow the patient with you and assist in her management on as-needed basis.

## 2015-01-15 NOTE — Progress Notes (Signed)
Patient Demographics  Susan Garrett, is a 80 y.o. female, DOB - 12-May-1935, MBW:[id number]  Admit date - 01/14/2015   Admitting Physician Albertine Patricia, MD  Outpatient Primary MD for the patient is [name], MD  LOS - 1   Chief Complaint  Patient presents with  . Palpitations    rapid HR x1 week  . Shortness of Breath    hypoxic at PCP appt this AM  . Fatigue    feeling generally unwell "for a while now", just finished chemo/radiation       Admission HPI/Brief narrative: 80 y.o. Female, with history of hypertension, hyperthyroidism, diagnosis of stage IIB unresectable non-small cell lung cancer, squamous cell carcinoma, undergoing concurrent chemoradiation (finished chemotherapy before 2 weeks, radiation last week), presents weakness, tachycardia, shortness of breath, CTA chest negative for PE, patient admitted for COPD exacerbation treatment.  Subjective:   Susan Garrett today has, No headache, No chest pain, No abdominal pain - No Nausea, reports generalized weakness, and improving shortness of breath.  Assessment & Plan    Active Problems:   Hypoxia   COPD exacerbation   Acute respiratory failure   HTN (hypertension)   Hyperthyroidism   Protein calorie malnutrition   Protein-calorie malnutrition, severe  Acute hypoxic respiratory failure  - Appears to be multifactorial, baseline dyspnea secondary to lung cancer and COPD. - Continue with oxygen as needed, pulmonary toilet, and nebulizers - wean oxygen as tolerated, will assess need for oxygen. Discharge.  COPD exacerbation - We'll taper IV Solu-Medrol down, DuoNeb every 6 hours, when necessary albuterol  Severe Protein calorie malnutrition -  nutritional service consult appreciated, continue with supplement  Hypertension - Continue with home medication  Hyperthyroidism  - Continue with methimazole, TSH and free T4  within normal limits   Sinus tachycardia - CT chest angina negative for PE, due to dehydration - continue with IV fluids  proLonged QTc - Monitor on telemetry, much improved today.  Abnormal EKG - Repeat EKG this a.m. showing inverted T waves in anterolateral lead, will check troponins, we'll check 2-D echo, patient denies any chest pain, she is already on aspirin and beta blockers.  Code Status: full code  Family Communication: none at bedside  Disposition Plan: home when stable in 24-48 hours   Procedures  none   Consults   none   Medications  Scheduled Meds: . aspirin EC  81 mg Oral Daily  . docusate sodium  100 mg Oral BID  . feeding supplement (ENSURE ENLIVE)  237 mL Oral BID BM  . heparin  5,000 Units Subcutaneous 3 times per day  . ipratropium-albuterol  3 mL Nebulization TID  . methimazole  2.5 mg Oral Daily  . methylPREDNISolone (SOLU-MEDROL) injection  60 mg Intravenous 3 times per day  . metoprolol succinate  25 mg Oral Daily  . senna  1 tablet Oral BID  . sodium chloride  3 mL Intravenous Q12H   Continuous Infusions: . sodium chloride 75 mL/hr at 01/15/15 0504   PRN Meds:.acetaminophen **OR** acetaminophen, albuterol, bisacodyl, HYDROcodone-acetaminophen, ondansetron **OR** ondansetron (ZOFRAN) IV, polyethylene glycol, prochlorperazine  DVT Prophylaxis  Heparin   Lab Results  Component Value Date   PLT 353 01/15/2015    Antibiotics   Anti-infectives  None          Objective:   Filed Vitals:   01/14/15 2041 01/14/15 2204 01/15/15 0528 01/15/15 1347  BP:  124/67 123/74 147/92  Pulse:  105 105 121  Temp:  98.2 F (36.8 C) 97.4 F (36.3 C) 97.6 F (36.4 C)  TempSrc:  Oral Oral Axillary  Resp:  '20 20 21  '$ Height:      Weight:      SpO2: 97% 96% 97% 94%    Wt Readings from Last 3 Encounters:  01/14/15 45.6 kg (100 lb 8.5 oz)  01/03/15 45.949 kg (101 lb 4.8 oz)  12/31/14 45.949 kg (101 lb 4.8 oz)     Intake/Output Summary  (Last 24 hours) at 01/15/15 1407 Last data filed at 01/15/15 0800  Gross per 24 hour  Intake 1233.75 ml  Output      3 ml  Net 1230.75 ml     Physical Exam  Awake Alert, Oriented X 3, frail. [place].AT,PERRAL Supple Neck,No JVD, Symmetrical Chest wall movement, fair air movement bilaterally, no wheezing RRR,No Gallops,Rubs or new Murmurs, No Parasternal Heave +ve B.Sounds, Abd Soft, No tenderness, No organomegaly appriciated, No Cyanosis, Clubbing or edema, No new Rash or bruis   Data Review   Micro Results No results found for this or any previous visit (from the past 240 hour(s)).  Radiology Reports Dg Chest 2 View  01/14/2015   CLINICAL DATA:  Shortness breath. Palpitations. Hypoxia. Right lung cancer.  EXAM: CHEST  2 VIEW  COMPARISON:  10/18/2014 and chest CT dated 10/11/2014  FINDINGS: The mass in the right upper lobe has almost completely resolved with some slight residual streaky haziness in the area of the previously demonstrated tumor. The lungs are hyperinflated with flattening of the diaphragm consistent with COPD. No acute infiltrates or effusions.  No acute osseous abnormality.  IMPRESSION: Emphysema. Marked reduction in the tumor in the right upper lobe since the prior study.   Electronically Signed   By: Lorriane Shire M.D.   On: 01/14/2015 13:53   Ct Angio Chest Pe W/cm &/or Wo Cm  01/14/2015   CLINICAL DATA:  O'clock see a, shortness of breath, tachycardia and history of lung carcinoma.  EXAM: CT ANGIOGRAPHY CHEST WITH CONTRAST  TECHNIQUE: Multidetector CT imaging of the chest was performed using the standard protocol during bolus administration of intravenous contrast. Multiplanar CT image reconstructions and MIPs were obtained to evaluate the vascular anatomy.  CONTRAST:  172m OMNIPAQUE IOHEXOL 350 MG/ML SOLN  COMPARISON:  Chest x-ray earlier today, PET scan on 11/01/2014 and CT of the chest, abdomen and pelvis on 10/11/2014.  FINDINGS: The pulmonary arteries are well  opacified. There is no evidence of pulmonary embolism. There is diminishment in size of a right upper lobe pulmonary mass with retraction of tissues and surrounding parenchymal opacity present consistent with radiation therapy changes. At the level of the original 5 cm tumor, residual soft tissue measures roughly 1.5 x 3.0 cm. It is difficult to determine whether there is active disease in this region. There is no evidence of airway obstruction, pneumothorax, pulmonary edema or focal infiltrate.  The lungs again demonstrate advanced emphysematous disease. No enlarged lymph nodes are identified. The heart size is normal. No pleural or pericardial fluid is seen. Bony structures show no lesions or fractures.  Review of the MIP images confirms the above findings.  IMPRESSION: No evidence of pulmonary embolism. Changes in the right upper lobe related to treatment of a right upper lung carcinoma  with radiation. Scarring and surrounding parenchymal changes are identified with diminishment in size of the primary tumor.   Electronically Signed   By: Aletta Edouard M.D.   On: 01/14/2015 16:53     CBC  Recent Labs Lab 01/14/15 1306 01/15/15 0515  WBC 3.7* 3.0*  HGB 14.4 13.4  HCT 45.0 42.8  PLT 383 353  MCV 85.4 86.3  MCH 27.3 27.0  MCHC 32.0 31.3  RDW 16.9* 16.9*  LYMPHSABS 0.9  --   MONOABS 0.8  --   EOSABS 0.0  --   BASOSABS 0.0  --     Chemistries   Recent Labs Lab 01/14/15 1306 01/15/15 0515  NA 136 139  K 4.1 4.1  CL 93* 101  CO2 34* 31  GLUCOSE 134* 198*  BUN 15 12  CREATININE 0.79 0.61  CALCIUM 9.7 8.8*   ------------------------------------------------------------------------------------------------------------------ estimated creatinine clearance is 41.7 mL/min (by C-G formula based on Cr of 0.61). ------------------------------------------------------------------------------------------------------------------ No results for input(s): HGBA1C in the last 72  hours. ------------------------------------------------------------------------------------------------------------------ No results for input(s): CHOL, HDL, LDLCALC, TRIG, CHOLHDL, LDLDIRECT in the last 72 hours. ------------------------------------------------------------------------------------------------------------------  Recent Labs  01/14/15 1344  TSH 0.494   ------------------------------------------------------------------------------------------------------------------ No results for input(s): VITAMINB12, FOLATE, FERRITIN, TIBC, IRON, RETICCTPCT in the last 72 hours.  Coagulation profile No results for input(s): INR, PROTIME in the last 168 hours.  No results for input(s): DDIMER in the last 72 hours.  Cardiac Enzymes  Recent Labs Lab 01/14/15 1612  TROPONINI <0.03   ------------------------------------------------------------------------------------------------------------------ Invalid input(s): POCBNP     Time Spent in minutes   30 minutes    Jaxton Casale M.D on 01/15/2015 at 2:07 PM  Between 7am to 7pm - Pager - [phone]  After 7pm go to [email or web address] - password [facility]  [facility]   Office  [phone]

## 2015-01-15 NOTE — Progress Notes (Signed)
SATURATION QUALIFICATIONS: (This note is used to comply with regulatory documentation for home oxygen)  Patient Saturations on Room Air at Rest = 83%  Patient Saturations on Room Air while Ambulating = na  Patient Saturations on 2 Liters of oxygen at rest= 92%  Please briefly explain why patient needs home oxygen:

## 2015-01-16 ENCOUNTER — Inpatient Hospital Stay (HOSPITAL_COMMUNITY): Payer: Medicare Other

## 2015-01-16 DIAGNOSIS — R0600 Dyspnea, unspecified: Secondary | ICD-10-CM

## 2015-01-16 LAB — BASIC METABOLIC PANEL
Anion gap: 5 (ref 5–15)
BUN: 15 mg/dL (ref 6–20)
CHLORIDE: 104 mmol/L (ref 101–111)
CO2: 32 mmol/L (ref 22–32)
CREATININE: 0.51 mg/dL (ref 0.44–1.00)
Calcium: 8.9 mg/dL (ref 8.9–10.3)
GFR calc non Af Amer: >60 mL/min (ref >60)
Glucose, Bld: 143 mg/dL — ABNORMAL HIGH (ref 65–99)
Potassium: 4.3 mmol/L (ref 3.5–5.1)
Sodium: 141 mmol/L (ref 135–145)

## 2015-01-16 LAB — HEMOGLOBIN A1C
Hgb A1c MFr Bld: 7 % — ABNORMAL HIGH (ref 4.8–5.6)
MEAN PLASMA GLUCOSE: 154 mg/dL

## 2015-01-16 MED ORDER — IPRATROPIUM-ALBUTEROL 0.5-2.5 (3) MG/3ML IN SOLN: 3.0000 mL | Freq: Four times a day (QID) | RESPIRATORY_TRACT | Status: DC | PRN

## 2015-01-16 MED ORDER — ALBUTEROL SULFATE (2.5 MG/3ML) 0.083% IN NEBU: 2.5000 mg | INHALATION_SOLUTION | Freq: Four times a day (QID) | RESPIRATORY_TRACT | Status: DC | PRN

## 2015-01-16 MED ORDER — SODIUM CHLORIDE 0.9 % IV BOLUS (SEPSIS)
250.0000 mL | Freq: Once | INTRAVENOUS | Status: AC
  Administered 2015-01-16: 250 mL via INTRAVENOUS

## 2015-01-16 MED ORDER — ENSURE ENLIVE PO LIQD: 237.0000 mL | Freq: Two times a day (BID) | ORAL | Status: DC

## 2015-01-16 MED ORDER — PREDNISONE 10 MG PO TABS: ORAL_TABLET | ORAL | Status: DC

## 2015-01-16 MED ORDER — METHYLPREDNISOLONE 4 MG PO TBPK: ORAL_TABLET | ORAL | Status: DC

## 2015-01-16 NOTE — Care Management Important Message (Signed)
Important Message  Patient Details  Name: ELLIOT SIMONEAUX MRN: [id number] Date of Birth: 31-Jul-1934   Medicare Important Message Given:  Yes-second notification given    Camillo Flaming 01/16/2015, 11:49 AMImportant Message  Patient Details  Name: JONAI WEYLAND MRN: [id number] Date of Birth: Jan 20, 1935   Medicare Important Message Given:  Yes-second notification given    Camillo Flaming 01/16/2015, 11:49 AM

## 2015-01-16 NOTE — Discharge Summary (Addendum)
EVERETT RICCIARDELLI, is a 80 y.o. female  DOB 18-Apr-1935  MRN [id number].  Admission date:  01/14/2015  Admitting Physician  Albertine Patricia, MD  Discharge Date:  01/16/2015   Primary MD  [name], MD  Recommendations for primary care physician for things to follow:  - CBC, BMP during next visit.   Admission Diagnosis  Tachycardia [R00.0] Hypoxia [R09.02]   Discharge Diagnosis  Tachycardia [R00.0] Hypoxia [R09.02]    Active Problems:   Hypoxia   COPD exacerbation   Acute respiratory failure   HTN (hypertension)   Hyperthyroidism   Protein calorie malnutrition   Protein-calorie malnutrition, severe      Past Medical History  Diagnosis Date  . Hypertension   . Corns and callosities   . Callus   . Hyperthyroidism   . Encounter for antineoplastic chemotherapy 11/13/2014  . Encounter for antineoplastic chemotherapy 11/13/2014    Past Surgical History  Procedure Laterality Date  . Lesion removal Left 11/12/87  . Vaginal hysterectomy    . Video bronchoscopy Bilateral 10/18/2014    Procedure: VIDEO BRONCHOSCOPY WITH FLUORO;  Surgeon: Tanda Rockers, MD;  Location: [facility];  Service: Endoscopy;  Laterality: Bilateral;       History of present illness and  Hospital Course:     Kindly see H&P for history of present illness and admission details, please review complete Labs, Consult reports and Test reports for all details in brief  HPI  from the history and physical done on the day of admission Fortune Torosian is a 80 y.o. female, with past medical history of I per tension, hyperthyroidism, diagnosis of stage IIB unresectable non-small cell lung cancer, squamous cell carcinoma, undergoing concurrent chemoradiation (finished chemotherapy before 2 weeks, radiation last week), presents with complaints of palpitation, progressive weakness and dyspnea, reports her dyspnea is exertional, hypoxic  in ED 78% on room air, Sinus tachycardia at 130s, CT chest angiogram negative for PE, but sig significant for severe emphysema, started on oxygen, Solu-Medrol, nebulizers, hospitalist requested to admit, patient denies cough, productive sputum, fever or chills, reports poor appetite, and weight loss   Hospital Course  Acute hypoxic respiratory failure  - Appears to be multifactorial, but most contributing factor he appears to be acute COPD exacerbation , has baseline dyspnea secondary to lung cancer, and COPD at baseline. - Will need oxygen 2 L nasal cannula at home, has been arranged.  COPD exacerbation -  initially on IV Solu-Medrol,, currently transitioned to oral steroids, no further wheezing, will discharge on Medrol taper , and when necessary DuoNeb at home.  Protein calorie malnutrition -continue supplement  Hypertension - Continue with home medication  Hyperthyroidism  - Continue with methimazole, TSH and free T4 within normal limits   Sinus tachycardia - CT chest angiogram negative for PE,  due to dehydration - Improved with IV fluids  proLonged QTc - Was monitored on telemetry, no significant effect, significantly improved on repeat EKG, 505 on 8/9  Abnormal EKG with abnormal T waves in anterior lateral leads -  This is most likely related to LVH, negative troponins, no significant events on telemetry, 2-D echo with no significant wall motion abnormalities.  Discharge Condition:  stable   Follow UP  Follow-up Information    Call [name], MD.   Specialty:  Internal Medicine   Why:  Posthospitalization follow-up   Contact information:   [address] [place] [address] [phone]         Discharge Instructions  and  Discharge Medications         Discharge Instructions    Diet - low sodium heart healthy    Complete by:  As directed      Discharge instructions    Complete by:  As directed   Follow with Primary MD  [name], MD in 7 days   Get CBC, CMP, 2 view Chest X ray checked  by Primary MD next visit.    Activity: As tolerated with Full fall precautions use walker/cane & assistance as needed   Disposition Home    Diet: Heart Healthy ** , with feeding assistance and aspiration precautions.  For Heart failure patients - Check your Weight same time everyday, if you gain over 2 pounds, or you develop in leg swelling, experience more shortness of breath or chest pain, call your Primary MD immediately. Follow Cardiac Low Salt Diet and 1.5 lit/day fluid restriction.   On your next visit with your primary care physician please Get Medicines reviewed and adjusted.   Please request your Prim.MD to go over all Hospital Tests and Procedure/Radiological results at the follow up, please get all Hospital records sent to your Prim MD by signing hospital release before you go home.   If you experience worsening of your admission symptoms, develop shortness of breath, life threatening emergency, suicidal or homicidal thoughts you must seek medical attention immediately by calling 911 or calling your MD immediately  if symptoms less severe.  You Must read complete instructions/literature along with all the possible adverse reactions/side effects for all the Medicines you take and that have been prescribed to you. Take any new Medicines after you have completely understood and accpet all the possible adverse reactions/side effects.   Do not drive, operating heavy machinery, perform activities at heights, swimming or participation in water activities or provide baby sitting services if your were admitted for syncope or siezures until you have seen by Primary MD or a Neurologist and advised to do so again.  Do not drive when taking Pain medications.    Do not take more than prescribed Pain, Sleep and Anxiety Medications  Special Instructions: If you have smoked or chewed Tobacco  in the last 2 yrs please  stop smoking, stop any regular Alcohol  and or any Recreational drug use.  Wear Seat belts while driving.   Please note  You were cared for by a hospitalist during your hospital stay. If you have any questions about your discharge medications or the care you received while you were in the hospital after you are discharged, you can call the unit and asked to speak with the hospitalist on call if the hospitalist that took care of you is not available. Once you are discharged, your primary care physician will handle any further medical issues. Please note that NO REFILLS for any discharge medications will be authorized once you are discharged, as it is imperative that you return to your primary care physician (or establish a relationship with a primary care physician if you do not  have one) for your aftercare needs so that they can reassess your need for medications and monitor your lab values.     Increase activity slowly    Complete by:  As directed             Medication List    TAKE these medications        albuterol (2.5 MG/3ML) 0.083% nebulizer solution  Commonly known as:  PROVENTIL  Take 3 mLs (2.5 mg total) by nebulization every 6 (six) hours as needed for wheezing.     aspirin 81 MG tablet  Take 81 mg by mouth daily.     feeding supplement (ENSURE ENLIVE) Liqd  Take 237 mLs by mouth 2 (two) times daily between meals.     methimazole 5 MG tablet  Commonly known as:  TAPAZOLE  Take 2.5 mg by mouth daily.     methylPREDNISolone 4 MG Tbpk tablet  Commonly known as:  MEDROL  Please take 6 tablets on day 1, then 5 tablets on day 2, then 4 tablets on day 3, then 3 tablets on day 4, then 2 tablets on day 5, then 1 tablet on day 6, then stop.     metoprolol succinate 25 MG 24 hr tablet  Commonly known as:  TOPROL-XL  Take 25 mg by mouth daily.     prochlorperazine 5 MG tablet  Commonly known as:  COMPAZINE  Take 1 tablet (5 mg total) by mouth every 6 (six) hours as needed for  nausea or vomiting.     RADIAPLEX EX  Apply 1 application topically daily as needed (when skin feels rough from radiation).          Diet and Activity recommendation: See Discharge Instructions above   Consults obtained -  None    Major procedures and Radiology Reports - PLEASE review detailed and final reports for all details, in brief -      Dg Chest 2 View  01/14/2015   CLINICAL DATA:  Shortness breath. Palpitations. Hypoxia. Right lung cancer.  EXAM: CHEST  2 VIEW  COMPARISON:  10/18/2014 and chest CT dated 10/11/2014  FINDINGS: The mass in the right upper lobe has almost completely resolved with some slight residual streaky haziness in the area of the previously demonstrated tumor. The lungs are hyperinflated with flattening of the diaphragm consistent with COPD. No acute infiltrates or effusions.  No acute osseous abnormality.  IMPRESSION: Emphysema. Marked reduction in the tumor in the right upper lobe since the prior study.   Electronically Signed   By: Lorriane Shire M.D.   On: 01/14/2015 13:53   Ct Angio Chest Pe W/cm &/or Wo Cm  01/14/2015   CLINICAL DATA:  O'clock see a, shortness of breath, tachycardia and history of lung carcinoma.  EXAM: CT ANGIOGRAPHY CHEST WITH CONTRAST  TECHNIQUE: Multidetector CT imaging of the chest was performed using the standard protocol during bolus administration of intravenous contrast. Multiplanar CT image reconstructions and MIPs were obtained to evaluate the vascular anatomy.  CONTRAST:  148m OMNIPAQUE IOHEXOL 350 MG/ML SOLN  COMPARISON:  Chest x-ray earlier today, PET scan on 11/01/2014 and CT of the chest, abdomen and pelvis on 10/11/2014.  FINDINGS: The pulmonary arteries are well opacified. There is no evidence of pulmonary embolism. There is diminishment in size of a right upper lobe pulmonary mass with retraction of tissues and surrounding parenchymal opacity present consistent with radiation therapy changes. At the level of the original 5  cm tumor, residual soft tissue measures  roughly 1.5 x 3.0 cm. It is difficult to determine whether there is active disease in this region. There is no evidence of airway obstruction, pneumothorax, pulmonary edema or focal infiltrate.  The lungs again demonstrate advanced emphysematous disease. No enlarged lymph nodes are identified. The heart size is normal. No pleural or pericardial fluid is seen. Bony structures show no lesions or fractures.  Review of the MIP images confirms the above findings.  IMPRESSION: No evidence of pulmonary embolism. Changes in the right upper lobe related to treatment of a right upper lung carcinoma with radiation. Scarring and surrounding parenchymal changes are identified with diminishment in size of the primary tumor.   Electronically Signed   By: Aletta Edouard M.D.   On: 01/14/2015 16:53    Micro Results    No results found for this or any previous visit (from the past 240 hour(s)).     Today   Subjective:   Pedro [place] today has no headache,no chest abdominal pain,no new weakness tingling or numbness, feels much better today. Improving SOB.  Objective:   Blood pressure 131/74, pulse 109, temperature 97.7 F (36.5 C), temperature source Oral, resp. rate 20, height '5\' 4"'$  (1.626 m), weight 45.6 kg (100 lb 8.5 oz), SpO2 97 %.   Intake/Output Summary (Last 24 hours) at 01/16/15 1534 Last data filed at 01/16/15 1100  Gross per 24 hour  Intake 318.75 ml  Output      3 ml  Net 315.75 ml    Exam Awake Alert, Oriented X 3, frail. [place].AT,PERRAL Supple Neck,No JVD, Symmetrical Chest wall movement, fair air movement bilaterally, no wheezing RRR,No Gallops,Rubs or new Murmurs, No Parasternal Heave +ve B.Sounds, Abd Soft, No tenderness, No organomegaly appriciated, No Cyanosis, Clubbing or edema, No new Rash or bruis  Data Review   CBC w Diff:  Lab Results  Component Value Date   WBC 3.0* 01/15/2015   WBC 2.2* 12/31/2014   HGB 13.4 01/15/2015    HGB 14.1 12/31/2014   HCT 42.8 01/15/2015   HCT 44.2 12/31/2014   PLT 353 01/15/2015   PLT 185 12/31/2014   LYMPHOPCT 25 01/14/2015   LYMPHOPCT 16.1 12/31/2014   MONOPCT 22* 01/14/2015   MONOPCT 24.5* 12/31/2014   EOSPCT 1 01/14/2015   EOSPCT 0.7 12/31/2014   BASOPCT 1 01/14/2015   BASOPCT 0.6 12/31/2014    CMP:  Lab Results  Component Value Date   NA 141 01/16/2015   NA 136 12/31/2014   K 4.3 01/16/2015   K 4.4 12/31/2014   CL 104 01/16/2015   CO2 32 01/16/2015   CO2 26 12/31/2014   BUN 15 01/16/2015   BUN 12.4 12/31/2014   CREATININE 0.51 01/16/2015   CREATININE 0.8 12/31/2014   PROT 6.5 12/31/2014   ALBUMIN 3.2* 12/31/2014   BILITOT 1.34* 12/31/2014   ALKPHOS 190* 12/31/2014   AST 27 12/31/2014   ALT 64* 12/31/2014  .   Total Time in preparing paper work, data evaluation and todays exam - 35 minutes  ELGERGAWY, DAWOOD M.D on 01/16/2015 at 3:34 PM  [facility]   Office  [phone]

## 2015-01-16 NOTE — Discharge Instructions (Signed)
Follow with Primary MD [name], MD in 7 days   Get CBC, CMP, 2 view Chest X ray checked  by Primary MD next visit.    Activity: As tolerated with Full fall precautions use walker/cane & assistance as needed   Disposition Home     Diet: Heart Healthy  , with feeding assistance and aspiration precautions.  For Heart failure patients - Check your Weight same time everyday, if you gain over 2 pounds, or you develop in leg swelling, experience more shortness of breath or chest pain, call your Primary MD immediately. Follow Cardiac Low Salt Diet and 1.5 lit/day fluid restriction.   On your next visit with your primary care physician please Get Medicines reviewed and adjusted.   Please request your Prim.MD to go over all Hospital Tests and Procedure/Radiological results at the follow up, please get all Hospital records sent to your Prim MD by signing hospital release before you go home.   If you experience worsening of your admission symptoms, develop shortness of breath, life threatening emergency, suicidal or homicidal thoughts you must seek medical attention immediately by calling 911 or calling your MD immediately  if symptoms less severe.  You Must read complete instructions/literature along with all the possible adverse reactions/side effects for all the Medicines you take and that have been prescribed to you. Take any new Medicines after you have completely understood and accpet all the possible adverse reactions/side effects.   Do not drive, operating heavy machinery, perform activities at heights, swimming or participation in water activities or provide baby sitting services if your were admitted for syncope or siezures until you have seen by Primary MD or a Neurologist and advised to do so again.  Do not drive when taking Pain medications.    Do not take more than prescribed Pain, Sleep and Anxiety Medications  Special Instructions: If you have smoked or chewed Tobacco  in  the last 2 yrs please stop smoking, stop any regular Alcohol  and or any Recreational drug use.  Wear Seat belts while driving.   Please note  You were cared for by a hospitalist during your hospital stay. If you have any questions about your discharge medications or the care you received while you were in the hospital after you are discharged, you can call the unit and asked to speak with the hospitalist on call if the hospitalist that took care of you is not available. Once you are discharged, your primary care physician will handle any further medical issues. Please note that NO REFILLS for any discharge medications will be authorized once you are discharged, as it is imperative that you return to your primary care physician (or establish a relationship with a primary care physician if you do not have one) for your aftercare needs so that they can reassess your need for medications and monitor your lab values.

## 2015-01-16 NOTE — Progress Notes (Signed)
Spoke with pt concerning HH.  Pt states that she do not want HHPT or any HH at present time.

## 2015-01-16 NOTE — Progress Notes (Signed)
  Echocardiogram 2D Echocardiogram has been performed.  Jennette Dubin 01/16/2015, 12:36 PM

## 2015-01-16 NOTE — Progress Notes (Signed)
SATURATION QUALIFICATIONS: (This note is used to comply with regulatory documentation for home oxygen)  Patient Saturations on Room Air at Rest = 83%  Patient Saturations on Room Air while Ambulating = 81%  Patient Saturations on Liters of oxygen while Ambulating = 93%  Please briefly explain why patient needs home oxygen:

## 2015-01-25 ENCOUNTER — Telehealth: Payer: Self-pay | Admitting: Medical Oncology

## 2015-01-25 NOTE — Telephone Encounter (Signed)
Does she need repeat scan in aug since she just had one in hospital? I told pt I will call her back Monday  but probably does not need it. Transferred call to radiation due to question about her appt.

## 2015-01-28 ENCOUNTER — Telehealth: Payer: Self-pay | Admitting: *Deleted

## 2015-01-28 ENCOUNTER — Telehealth: Payer: Self-pay | Admitting: Radiation Oncology

## 2015-01-28 NOTE — Telephone Encounter (Signed)
pof to scheduling to move MD appt to after CT scan

## 2015-01-28 NOTE — Telephone Encounter (Signed)
Notified pt CT scan will be delayed by a few weeks per MD due to recent scan while inpatient. Called Rad Onc, lmovm for Dr. Tammi Klippel nurse to check with MD, pt concerned about MD visit without CT Scan results. POF to scheduling

## 2015-01-28 NOTE — Telephone Encounter (Signed)
Phoned patient per Dr. Johny Shears order. Explained the CT scan she had done as an inpatient looks good. Encouraged patient to keep one month follow up with Dr. Tammi Klippel for 8/25 where he could review the scan in further details. Patient verbalized understanding.

## 2015-01-31 ENCOUNTER — Encounter: Payer: Self-pay | Admitting: Radiation Oncology

## 2015-01-31 ENCOUNTER — Ambulatory Visit
Admission: RE | Admit: 2015-01-31 | Discharge: 2015-01-31 | Disposition: A | Discharge status: Home / Self Care | Payer: Medicare Other | Source: Ambulatory Visit | Attending: Radiation Oncology | Admitting: Radiation Oncology

## 2015-01-31 VITALS — BP 120/65 | HR 111 | Resp 18 | Wt 100.0 lb

## 2015-01-31 DIAGNOSIS — C3411 Malignant neoplasm of upper lobe, right bronchus or lung: Secondary | ICD-10-CM

## 2015-01-31 NOTE — Progress Notes (Signed)
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     ICD-9-CM ICD-10-CM   1. Primary cancer of right upper lobe of lung 162.3 C34.11     Interval Since Last Radiation:  1  months  Narrative:  The patient returns today for routine follow-up.  Weight and vitals stable. Denies pain. Oxygen therapy 2 liters via nasal cannula noted. Reports stamina has slightly improved. Denies cough and fever. Denies dyspnea or SOB with exertion so long as she has her oxygen on. Reports she strangles easily. Denies pain associated with swallowing. Skin within treatment field has return to normal with the exception of dryness on her right upper back. Discharged from the hospital on 01/16/2015 after an episode of rapid heart rate, anxiety and SOB. Patient feels as though she is becoming stronger, but not as quickly as she would like. She is not currently on steroids. She has previously used steroids which caused stomach issues.  Patient notes that it is not easy for her to get out and about, including coming to the hospital.                               ALLERGIES:  is allergic to shellfish allergy.  Meds: Current Outpatient Prescriptions  Medication Sig Dispense Refill  . ALPRAZolam (XANAX) 0.25 MG tablet TAKE 1 TABLET BY MOUTH AT BEDTIME AND 1 EVERY MORNING AS NEEDED FOR NERVES  0  . aspirin 81 MG tablet Take 81 mg by mouth daily.    . feeding supplement, ENSURE ENLIVE, (ENSURE ENLIVE) LIQD Take 237 mLs by mouth 2 (two) times daily between meals. 237 mL 12  . methimazole (TAPAZOLE) 5 MG tablet Take 2.5 mg by mouth daily.     . metoprolol succinate (TOPROL-XL) 25 MG 24 hr tablet Take 25 mg by mouth  daily.     [name] albuterol (PROVENTIL) (2.5 MG/3ML) 0.083% nebulizer solution Take 3 mLs (2.5 mg total) by nebulization every 6 (six) hours as needed for wheezing. (Patient not taking: Reported on 01/31/2015) 75 mL 12  . methylPREDNISolone (MEDROL) 4 MG TBPK tablet Please take 6 tablets on day 1, then 5 tablets on day 2, then 4 tablets on day 3, then 3 tablets on day 4, then 2 tablets on day 5, then 1 tablet on day 6, then stop. (Patient not taking: Reported on 01/31/2015) 21 tablet 0  . prochlorperazine (COMPAZINE) 5 MG tablet Take 1 tablet (5 mg total) by mouth every 6 (six) hours as needed for nausea or vomiting. (Patient not taking: Reported on 01/31/2015) 30 tablet 0  . sucralfate (CARAFATE) 1 G tablet     . Wound Cleansers (RADIAPLEX EX) Apply 1 application topically daily as needed (when skin feels rough from radiation).      No current facility-administered medications for this encounter.    Physical Findings: The patient is in no acute distress. Patient is alert and oriented.  weight is 100 lb (45.36 kg). Her blood pressure is 120/65 and her pulse is 111. Her respiration is 18 and oxygen saturation is 100%. .  No significant changes. Thin.  Lab Findings: Lab Results  Component Value Date   WBC 3.0* 01/15/2015   WBC 2.2* 12/31/2014   HGB 13.4 01/15/2015   HGB 14.1 12/31/2014   HCT 42.8 01/15/2015   HCT 44.2 12/31/2014   PLT 353 01/15/2015   PLT 185 12/31/2014    Lab Results  Component Value Date   NA 141 01/16/2015   NA 136 12/31/2014   K 4.3 01/16/2015   K 4.4 12/31/2014   CHLORIDE 100 12/31/2014   CO2 32 01/16/2015   CO2 26 12/31/2014   GLUCOSE 143* 01/16/2015   GLUCOSE 165* 12/31/2014   BUN 15 01/16/2015   BUN 12.4 12/31/2014   CREATININE 0.51 01/16/2015   CREATININE 0.8 12/31/2014   BILITOT 1.34* 12/31/2014   ALKPHOS 190* 12/31/2014   AST 27 12/31/2014   ALT 64* 12/31/2014   PROT 6.5 12/31/2014   ALBUMIN 3.2* 12/31/2014   CALCIUM 8.9 01/16/2015   CALCIUM 9.3  12/31/2014   ANIONGAP 5 01/16/2015   ANIONGAP 10 12/31/2014    Radiographic Findings: Dg Chest 2 View  01/14/2015   CLINICAL DATA:  Shortness breath. Palpitations. Hypoxia. Right lung cancer.  EXAM: CHEST  2 VIEW  COMPARISON:  10/18/2014 and chest CT dated 10/11/2014  FINDINGS: The mass in the right upper lobe has almost completely resolved with some slight residual streaky haziness in the area of the previously demonstrated tumor. The lungs are hyperinflated with flattening of the diaphragm consistent with COPD. No acute infiltrates or effusions.  No acute osseous abnormality.  IMPRESSION: Emphysema. Marked reduction in the tumor in the right upper lobe since the prior study.   Electronically Signed   By: Lorriane Shire M.D.   On: 01/14/2015 13:53   Ct Angio Chest Pe W/cm &/or Wo Cm  01/14/2015   CLINICAL DATA:  O'clock see a, shortness of breath, tachycardia and history of lung carcinoma.  EXAM: CT ANGIOGRAPHY CHEST WITH CONTRAST  TECHNIQUE: Multidetector CT imaging of the chest was performed using the standard protocol during bolus administration of intravenous contrast. Multiplanar CT image reconstructions and MIPs were obtained to evaluate the vascular anatomy.  CONTRAST:  11m OMNIPAQUE IOHEXOL 350 MG/ML SOLN  COMPARISON:  Chest x-ray earlier today, PET scan on 11/01/2014 and CT of the chest, abdomen and pelvis on 10/11/2014.  FINDINGS: The pulmonary arteries are well opacified. There is no evidence of pulmonary embolism. There is diminishment in size of a right upper lobe pulmonary mass with retraction of tissues and surrounding parenchymal opacity present consistent with radiation therapy changes. At the level of the original 5 cm tumor, residual soft tissue measures roughly 1.5 x 3.0 cm. It is difficult to determine whether there is active disease in this region. There is no evidence of airway obstruction, pneumothorax, pulmonary edema or focal infiltrate.  The lungs again demonstrate advanced  emphysematous disease. No enlarged lymph nodes are identified. The heart size is normal. No pleural or pericardial fluid is seen. Bony structures show no lesions or fractures.  Review of the MIP images confirms the above findings.  IMPRESSION: No evidence of pulmonary embolism. Changes in the right upper lobe related to treatment of a right upper lung carcinoma with radiation. Scarring and surrounding parenchymal changes are identified with diminishment in size of the primary tumor.   Electronically Signed   By: GAletta EdouardM.D.   On: 01/14/2015 16:53    Impression:  The patient is recovering from the effects of radiation. The patient's tumor appears to be smaller on CT imaging  which is encouraging. She has been suffering with dyspnea and tachycardia.   Plan:  Plan to follow up with Dr. Julien Nordmann. We will follow up by phone 2x weekly for the next couple of weeks. We will follow up in the clinic in the next few months. More frequent follow up appears to be warranted by the patient's symptoms. She may be at risk of potentially developing radiation pneumonitis in coming weeks. Short courses of steroids should be avoided in this clinical setting. If she becomes febrile without pneumonia, she may need 30-day course of prednisone starting at 60 mg daily.  Fortunately, she feels better today and is not febrile.  This document serves as a record of services personally performed by Tyler Pita, Garrett. It was created on his behalf by Arlyce Harman, a trained medical scribe. The creation of this record is based on the scribe's personal observations and the provider's statements to them. This document has been checked and approved by the attending provider.    _____________________________________  Sheral Apley. Tammi Klippel, M.D.

## 2015-01-31 NOTE — Progress Notes (Signed)
Weight and vitals stable. Denies pain. Oxygen therapy 2 liters via nasal cannula noted. Reports stamina has slightly improved. Denies cough. Denies dyspnea or SOB with exertion so long as she has her oxygen on. Reports she strangles easily. Denies pain associated with swallowing. Skin within treatment field has return to normal with the exception of dryness on her right upper back. Discharged from the hospital on 01/16/2015 after an episode of rapid heart rate, anxiety and SOB.  BP 120/65 mmHg  Pulse 111  Resp 18  Wt 100 lb (45.36 kg)  SpO2 100%  Wt Readings from Last 3 Encounters:  01/31/15 100 lb (45.36 kg)  01/14/15 100 lb 8.5 oz (45.6 kg)  01/03/15 101 lb 4.8 oz (45.949 kg)

## 2015-02-05 ENCOUNTER — Encounter (HOSPITAL_COMMUNITY): Payer: Self-pay

## 2015-02-05 ENCOUNTER — Other Ambulatory Visit: Payer: Self-pay | Admitting: Physician Assistant

## 2015-02-05 ENCOUNTER — Ambulatory Visit (HOSPITAL_COMMUNITY)
Admission: RE | Admit: 2015-02-05 | Discharge: 2015-02-05 | Disposition: A | Discharge status: Home / Self Care | Payer: Medicare Other | Source: Ambulatory Visit | Attending: Physician Assistant | Admitting: Physician Assistant

## 2015-02-05 DIAGNOSIS — C3411 Malignant neoplasm of upper lobe, right bronchus or lung: Secondary | ICD-10-CM | POA: Insufficient documentation

## 2015-02-05 DIAGNOSIS — Z08 Encounter for follow-up examination after completed treatment for malignant neoplasm: Secondary | ICD-10-CM | POA: Diagnosis present

## 2015-02-05 MED ORDER — IOHEXOL 300 MG/ML  SOLN: 75.0000 mL | Freq: Once | INTRAMUSCULAR | Status: DC | PRN

## 2015-02-12 ENCOUNTER — Telehealth: Payer: Self-pay | Admitting: Radiation Oncology

## 2015-02-12 ENCOUNTER — Encounter: Payer: Self-pay | Admitting: Internal Medicine

## 2015-02-12 ENCOUNTER — Ambulatory Visit (HOSPITAL_BASED_OUTPATIENT_CLINIC_OR_DEPARTMENT_OTHER): Payer: Medicare Other | Admitting: Internal Medicine

## 2015-02-12 ENCOUNTER — Telehealth: Payer: Self-pay | Admitting: Internal Medicine

## 2015-02-12 ENCOUNTER — Other Ambulatory Visit (HOSPITAL_BASED_OUTPATIENT_CLINIC_OR_DEPARTMENT_OTHER): Payer: Medicare Other

## 2015-02-12 VITALS — BP 144/81 | HR 103 | Temp 98.4°F | Resp 18 | Ht 64.0 in | Wt 105.7 lb

## 2015-02-12 DIAGNOSIS — I10 Essential (primary) hypertension: Secondary | ICD-10-CM | POA: Diagnosis not present

## 2015-02-12 DIAGNOSIS — R5383 Other fatigue: Secondary | ICD-10-CM

## 2015-02-12 DIAGNOSIS — C3411 Malignant neoplasm of upper lobe, right bronchus or lung: Secondary | ICD-10-CM | POA: Diagnosis not present

## 2015-02-12 DIAGNOSIS — J449 Chronic obstructive pulmonary disease, unspecified: Secondary | ICD-10-CM

## 2015-02-12 DIAGNOSIS — C3491 Malignant neoplasm of unspecified part of right bronchus or lung: Secondary | ICD-10-CM

## 2015-02-12 LAB — CBC WITH DIFFERENTIAL/PLATELET
BASO%: 0.7 % (ref 0.0–2.0)
Basophils Absolute: 0 10*3/uL (ref 0.0–0.1)
EOS%: 1.9 % (ref 0.0–7.0)
Eosinophils Absolute: 0.1 10*3/uL (ref 0.0–0.5)
HEMATOCRIT: 41.2 % (ref 34.8–46.6)
HEMOGLOBIN: 12.8 g/dL (ref 11.6–15.9)
LYMPH#: 1 10*3/uL (ref 0.9–3.3)
LYMPH%: 14.1 % (ref 14.0–49.7)
MCH: 27.6 pg (ref 25.1–34.0)
MCHC: 31.1 g/dL — ABNORMAL LOW (ref 31.5–36.0)
MCV: 88.8 fL (ref 79.5–101.0)
MONO#: 0.9 10*3/uL (ref 0.1–0.9)
MONO%: 11.5 % (ref 0.0–14.0)
NEUT#: 5.4 10*3/uL (ref 1.5–6.5)
NEUT%: 71.8 % (ref 38.4–76.8)
Platelets: 193 10*3/uL (ref 145–400)
RBC: 4.64 10*6/uL (ref 3.70–5.45)
RDW: 19.8 % — AB (ref 11.2–14.5)
WBC: 7.5 10*3/uL (ref 3.9–10.3)

## 2015-02-12 LAB — COMPREHENSIVE METABOLIC PANEL (CC13)
ALBUMIN: 3.4 g/dL — AB (ref 3.5–5.0)
ALK PHOS: 94 U/L (ref 40–150)
ALT: 34 U/L (ref 0–55)
AST: 28 U/L (ref 5–34)
Anion Gap: 7 mEq/L (ref 3–11)
BILIRUBIN TOTAL: 0.71 mg/dL (ref 0.20–1.20)
BUN: 14.5 mg/dL (ref 7.0–26.0)
CALCIUM: 10.1 mg/dL (ref 8.4–10.4)
CO2: 36 mEq/L — ABNORMAL HIGH (ref 22–29)
Chloride: 99 mEq/L (ref 98–109)
Creatinine: 0.7 mg/dL (ref 0.6–1.1)
GLUCOSE: 93 mg/dL (ref 70–140)
POTASSIUM: 5 meq/L (ref 3.5–5.1)
SODIUM: 142 meq/L (ref 136–145)
TOTAL PROTEIN: 6.7 g/dL (ref 6.4–8.3)

## 2015-02-12 NOTE — Telephone Encounter (Signed)
Gave patient avs report and appointments for December. Central to call with ct - patient aware.

## 2015-02-12 NOTE — Progress Notes (Signed)
[place] [phone]   [phone]  OFFICE PROGRESS NOTE  [name], MD [address] [place] [address]  DIAGNOSIS: Primary cancer of right lung  Staging form: Lung, AJCC 7th Edition  Clinical stage from 11/01/2014: Stage IIB (T2b, N1, M0) - Signed by Curt Bears, MD on 11/03/2014  PRIOR THERAPY: Course of concurrent chemoradiation with chemotherapy in the form of weekly carboplatin for an AUC of 2 and paclitaxel 45 mg/m given concurrent with radiation for 6-7 weeks. First cycle started 11/19/2014  CURRENT THERAPY:  Observation.  INTERVAL HISTORY: Susan Garrett 80 y.o. female returns to the clinic today for follow-up visit accompanied by her sister. The patient is feeling fine today with no specific complaints except for mild fatigue. She tolerated the previous course of concurrent chemoradiation fairly well except for the fatigue and radiation induced esophagitis. She was recently admitted to [facility] and treated for questionable pneumonia. The patient denied having any significant weight loss or night sweats. She has no nausea or vomiting. She denied having any significant fever or chills. She had no chest pain and continues to have mild shortness breath with cough with no hemoptysis. She had repeat CT scan of the chest performed recently and she is here today for evaluation and discussion of her scan results.  MEDICAL HISTORY: Past Medical History  Diagnosis Date  . Hypertension   . Corns and callosities   . Callus   . Hyperthyroidism   . Encounter for antineoplastic chemotherapy 11/13/2014  . Encounter for antineoplastic chemotherapy 11/13/2014    ALLERGIES:  is allergic to shellfish allergy.  MEDICATIONS:  Current Outpatient Prescriptions  Medication Sig Dispense Refill  . albuterol (PROVENTIL) (2.5 MG/3ML) 0.083% nebulizer solution Take 3 mLs (2.5 mg total) by nebulization every 6 (six)  hours as needed for wheezing. 75 mL 12  . ALPRAZolam (XANAX) 0.25 MG tablet TAKE 1 TABLET BY MOUTH AT BEDTIME AND 1 EVERY MORNING AS NEEDED FOR NERVES  0  . aspirin 81 MG tablet Take 81 mg by mouth daily.    . feeding supplement, ENSURE ENLIVE, (ENSURE ENLIVE) LIQD Take 237 mLs by mouth 2 (two) times daily between meals. 237 mL 12  . methimazole (TAPAZOLE) 5 MG tablet Take 2.5 mg by mouth daily.     . methylPREDNISolone (MEDROL) 4 MG TBPK tablet Please take 6 tablets on day 1, then 5 tablets on day 2, then 4 tablets on day 3, then 3 tablets on day 4, then 2 tablets on day 5, then 1 tablet on day 6, then stop. 21 tablet 0  . metoprolol succinate (TOPROL-XL) 25 MG 24 hr tablet Take 25 mg by mouth daily.     . polyethylene glycol (MIRALAX / GLYCOLAX) packet Take 17 g by mouth daily.    . prochlorperazine (COMPAZINE) 5 MG tablet Take 1 tablet (5 mg total) by mouth every 6 (six) hours as needed for nausea or vomiting. 30 tablet 0  . sennosides-docusate sodium (SENOKOT-S) 8.6-50 MG tablet Take 1 tablet by mouth daily.    . sucralfate (CARAFATE) 1 G tablet     . Wound Cleansers (RADIAPLEX EX) Apply 1 application topically daily as needed (when skin feels rough from radiation).      No current facility-administered medications for this visit.    SURGICAL HISTORY:  Past Surgical History  Procedure Laterality Date  . Lesion removal Left 11/12/87  . Vaginal hysterectomy    . Video bronchoscopy  Bilateral 10/18/2014    Procedure: VIDEO BRONCHOSCOPY WITH FLUORO;  Surgeon: Tanda Rockers, MD;  Location: [facility];  Service: Endoscopy;  Laterality: Bilateral;    REVIEW OF SYSTEMS:  Constitutional: positive for fatigue Eyes: negative Ears, nose, mouth, throat, and face: negative Respiratory: positive for cough and dyspnea on exertion Cardiovascular: negative Gastrointestinal: negative Genitourinary:negative Integument/breast: negative Hematologic/lymphatic:  negative Musculoskeletal:negative Neurological: negative Behavioral/Psych: negative Endocrine: negative Allergic/Immunologic: negative   PHYSICAL EXAMINATION: General appearance: alert, cooperative, fatigued and no distress Head: Normocephalic, without obvious abnormality, atraumatic Neck: no adenopathy, no JVD, supple, symmetrical, trachea midline and thyroid not enlarged, symmetric, no tenderness/mass/nodules Lymph nodes: Cervical, supraclavicular, and axillary nodes normal. Resp: clear to auscultation bilaterally Back: symmetric, no curvature. ROM normal. No CVA tenderness. Cardio: regular rate and rhythm, S1, S2 normal, no murmur, click, rub or gallop GI: soft, non-tender; bowel sounds normal; no masses,  no organomegaly Extremities: extremities normal, atraumatic, no cyanosis or edema Neurologic: Alert and oriented X 3, normal strength and tone. Normal symmetric reflexes. Normal coordination and gait  ECOG PERFORMANCE STATUS: 1 - Symptomatic but completely ambulatory  Blood pressure 144/81, pulse 103, temperature 98.4 F (36.9 C), temperature source Oral, resp. rate 18, height '5\' 4"'$  (1.626 m), weight 105 lb 11.2 oz (47.945 kg), SpO2 97 %.  LABORATORY DATA: Lab Results  Component Value Date   WBC 7.5 02/12/2015   HGB 12.8 02/12/2015   HCT 41.2 02/12/2015   MCV 88.8 02/12/2015   PLT 193 02/12/2015      Chemistry      Component Value Date/Time   NA 142 02/12/2015 1001   NA 141 01/16/2015 0520   K 5.0 02/12/2015 1001   K 4.3 01/16/2015 0520   CL 104 01/16/2015 0520   CO2 36* 02/12/2015 1001   CO2 32 01/16/2015 0520   BUN 14.5 02/12/2015 1001   BUN 15 01/16/2015 0520   CREATININE 0.7 02/12/2015 1001   CREATININE 0.51 01/16/2015 0520      Component Value Date/Time   CALCIUM 10.1 02/12/2015 1001   CALCIUM 8.9 01/16/2015 0520   ALKPHOS 94 02/12/2015 1001   AST 28 02/12/2015 1001   ALT 34 02/12/2015 1001   BILITOT 0.71 02/12/2015 1001       RADIOGRAPHIC  STUDIES: Dg Chest 2 View  01/14/2015   CLINICAL DATA:  Shortness breath. Palpitations. Hypoxia. Right lung cancer.  EXAM: CHEST  2 VIEW  COMPARISON:  10/18/2014 and chest CT dated 10/11/2014  FINDINGS: The mass in the right upper lobe has almost completely resolved with some slight residual streaky haziness in the area of the previously demonstrated tumor. The lungs are hyperinflated with flattening of the diaphragm consistent with COPD. No acute infiltrates or effusions.  No acute osseous abnormality.  IMPRESSION: Emphysema. Marked reduction in the tumor in the right upper lobe since the prior study.   Electronically Signed   By: Lorriane Shire M.D.   On: 01/14/2015 13:53   Ct Chest Wo Contrast  02/05/2015   CLINICAL DATA:  Non-small-cell lung cancer diagnosed 2016, carboplatin in progress  EXAM: CT CHEST WITHOUT CONTRAST  TECHNIQUE: Multidetector CT imaging of the chest was performed following the standard protocol without IV contrast.  COMPARISON:  CTA chest  dated 01/14/2015  FINDINGS: Mediastinum/Nodes: Heart is normal in size. No pericardial effusion.  Mild atherosclerotic calcifications of the aortic arch.  No suspicious mediastinal or axillary lymphadenopathy.  Visualized left thyroid is notable for a 1.4 cm nodule (series 2/ image 9).  Lungs/Pleura: 1.5 x  2.0 cm spiculated right upper lobe nodule (series 5/ image 25), corresponding to known primary bronchogenic neoplasm, mildly decreased from recent CTA chest. Surrounding right upper lobe radiation changes.  Underlying moderate to severe centrilobular and paraseptal emphysematous changes.  3 mm left lower lobe nodule (series 5/ image 38), unchanged. No new/suspicious pulmonary nodules.  No focal consolidation.  No pleural effusion or pneumothorax.  Upper abdomen: Visualized upper abdomen is notable for vascular calcifications.  Musculoskeletal: Degenerative changes of the visualized thoracolumbar spine.  IMPRESSION: 1.5 x 2.0 cm spiculated right upper  lobe pulmonary nodule, corresponding to known primary bronchogenic neoplasm, mildly decreased from recent CTA chest.  Surrounding right upper lobe radiation changes.  No evidence of metastatic disease.   Electronically Signed   By: Julian Hy M.D.   On: 02/05/2015 13:12   Ct Angio Chest Pe W/cm &/or Wo Cm  01/14/2015   CLINICAL DATA:  O'clock see a, shortness of breath, tachycardia and history of lung carcinoma.  EXAM: CT ANGIOGRAPHY CHEST WITH CONTRAST  TECHNIQUE: Multidetector CT imaging of the chest was performed using the standard protocol during bolus administration of intravenous contrast. Multiplanar CT image reconstructions and MIPs were obtained to evaluate the vascular anatomy.  CONTRAST:  168m OMNIPAQUE IOHEXOL 350 MG/ML SOLN  COMPARISON:  Chest x-ray earlier today, PET scan on 11/01/2014 and CT of the chest, abdomen and pelvis on 10/11/2014.  FINDINGS: The pulmonary arteries are well opacified. There is no evidence of pulmonary embolism. There is diminishment in size of a right upper lobe pulmonary mass with retraction of tissues and surrounding parenchymal opacity present consistent with radiation therapy changes. At the level of the original 5 cm tumor, residual soft tissue measures roughly 1.5 x 3.0 cm. It is difficult to determine whether there is active disease in this region. There is no evidence of airway obstruction, pneumothorax, pulmonary edema or focal infiltrate.  The lungs again demonstrate advanced emphysematous disease. No enlarged lymph nodes are identified. The heart size is normal. No pleural or pericardial fluid is seen. Bony structures show no lesions or fractures.  Review of the MIP images confirms the above findings.  IMPRESSION: No evidence of pulmonary embolism. Changes in the right upper lobe related to treatment of a right upper lung carcinoma with radiation. Scarring and surrounding parenchymal changes are identified with diminishment in size of the primary tumor.    Electronically Signed   By: GAletta EdouardM.D.   On: 01/14/2015 16:53    ASSESSMENT AND PLAN: This is a very pleasant 80years old African-American female with unresectable a stage IIB non-small cell lung cancer who completed a course of concurrent chemoradiation with weekly carboplatin and paclitaxel with significant improvement in her disease. I discussed the CT scan results with the patient and her sister and showed them the images. I explained to the patient that her options at this point would include 3 cycles of consolidation chemotherapy with carboplatin for AUC of 5 and paclitaxel 175 MG/M2 every 3 weeks with Neulasta support versus continuous observation and close monitoring. Her performance status and her tolerability for the previous course of concurrent chemoradiation are probably not good factors for her to consider the consolidation chemotherapy at this point. The patient will continue on observation with repeat CT scan of the chest in 3 months. She is currently on home oxygen and she would like to discontinue her home oxygen but after testing with exercise her oxygen saturation dropped down to 80% and we advised the patient to continue  with her home oxygen for now. She was advised to call immediately if she has any concerning symptoms in the interval. The patient voices understanding of current disease status and treatment options and is in agreement with the current care plan.  All questions were answered. The patient knows to call the clinic with any problems, questions or concerns. We can certainly see the patient much sooner if necessary.  I spent 15 minutes counseling the patient face to face. The total time spent in the appointment was 25 minutes.  Disclaimer: This note was dictated with voice recognition software. Similar sounding words can inadvertently be transcribed and may not be corrected upon review.

## 2015-02-12 NOTE — Telephone Encounter (Signed)
Phoned patient per Dr. Johny Shears order to inquire about status. Reports she was seen by Dr. Julien Nordmann today and is feeling much better. Per report treated lung nodule appears smaller inside s/p radiation. Patient confirms her sore throat and cough have resolved. Denies having a fever. Reports she continues to use oxygen therapy 2 liters via nasal cannula. Reports her energy level continues to improve. Confirms she is even smiling today. Confirms this nurse can continue to check in with her twice per week.

## 2015-02-15 ENCOUNTER — Telehealth: Payer: Self-pay | Admitting: Radiation Oncology

## 2015-02-15 NOTE — Telephone Encounter (Signed)
Phoned patient per Dr. Johny Shears order to inquire about her status. Patient confirms her sore throat and cough have resolved. Denies having a fever. Reports she continues to use oxygen therapy 2 liters via nasal cannula. Reports her energy level continues to improve.

## 2015-02-21 ENCOUNTER — Telehealth: Payer: Self-pay | Admitting: Radiation Oncology

## 2015-02-21 NOTE — Telephone Encounter (Signed)
Phoned patient to check in. She reports that she is able to do a little more each day. She states, "I am making it." Patient denies cough, fever, or weakness. Patient denies any other needs at this time.

## 2015-05-09 ENCOUNTER — Other Ambulatory Visit (HOSPITAL_BASED_OUTPATIENT_CLINIC_OR_DEPARTMENT_OTHER): Payer: Medicare Other

## 2015-05-09 ENCOUNTER — Ambulatory Visit (HOSPITAL_COMMUNITY)
Admission: RE | Admit: 2015-05-09 | Discharge: 2015-05-09 | Disposition: A | Discharge status: Home / Self Care | Payer: Medicare Other | Source: Ambulatory Visit | Attending: Internal Medicine | Admitting: Internal Medicine

## 2015-05-09 DIAGNOSIS — J432 Centrilobular emphysema: Secondary | ICD-10-CM | POA: Diagnosis not present

## 2015-05-09 DIAGNOSIS — C3491 Malignant neoplasm of unspecified part of right bronchus or lung: Secondary | ICD-10-CM

## 2015-05-09 DIAGNOSIS — Z923 Personal history of irradiation: Secondary | ICD-10-CM | POA: Diagnosis not present

## 2015-05-09 DIAGNOSIS — J449 Chronic obstructive pulmonary disease, unspecified: Secondary | ICD-10-CM

## 2015-05-09 DIAGNOSIS — J703 Chronic drug-induced interstitial lung disorders: Secondary | ICD-10-CM | POA: Diagnosis not present

## 2015-05-09 DIAGNOSIS — C3411 Malignant neoplasm of upper lobe, right bronchus or lung: Secondary | ICD-10-CM

## 2015-05-09 DIAGNOSIS — I10 Essential (primary) hypertension: Secondary | ICD-10-CM

## 2015-05-09 DIAGNOSIS — I2510 Atherosclerotic heart disease of native coronary artery without angina pectoris: Secondary | ICD-10-CM | POA: Insufficient documentation

## 2015-05-09 LAB — COMPREHENSIVE METABOLIC PANEL (CC13)
ALBUMIN: 3.8 g/dL (ref 3.5–5.0)
ALK PHOS: 92 U/L (ref 40–150)
ALT: 16 U/L (ref 0–55)
ANION GAP: 6 meq/L (ref 3–11)
AST: 26 U/L (ref 5–34)
BUN: 13.3 mg/dL (ref 7.0–26.0)
CALCIUM: 10.1 mg/dL (ref 8.4–10.4)
CHLORIDE: 100 meq/L (ref 98–109)
CO2: 35 mEq/L — ABNORMAL HIGH (ref 22–29)
Creatinine: 0.8 mg/dL (ref 0.6–1.1)
EGFR: 84 mL/min/{1.73_m2} — AB (ref >90)
Glucose: 85 mg/dl (ref 70–140)
POTASSIUM: 4.3 meq/L (ref 3.5–5.1)
Sodium: 142 mEq/L (ref 136–145)
Total Bilirubin: 0.74 mg/dL (ref 0.20–1.20)
Total Protein: 7.3 g/dL (ref 6.4–8.3)

## 2015-05-09 LAB — CBC WITH DIFFERENTIAL/PLATELET
BASO%: 0.3 % (ref 0.0–2.0)
BASOS ABS: 0 10*3/uL (ref 0.0–0.1)
EOS ABS: 0.1 10*3/uL (ref 0.0–0.5)
EOS%: 1.4 % (ref 0.0–7.0)
HEMATOCRIT: 44.3 % (ref 34.8–46.6)
HGB: 13.3 g/dL (ref 11.6–15.9)
LYMPH#: 1.6 10*3/uL (ref 0.9–3.3)
LYMPH%: 22 % (ref 14.0–49.7)
MCH: 29.3 pg (ref 25.1–34.0)
MCHC: 30 g/dL — AB (ref 31.5–36.0)
MCV: 97.6 fL (ref 79.5–101.0)
MONO#: 0.7 10*3/uL (ref 0.1–0.9)
MONO%: 9.1 % (ref 0.0–14.0)
NEUT#: 5 10*3/uL (ref 1.5–6.5)
NEUT%: 67.2 % (ref 38.4–76.8)
PLATELETS: 209 10*3/uL (ref 145–400)
RBC: 4.54 10*6/uL (ref 3.70–5.45)
RDW: 13 % (ref 11.2–14.5)
WBC: 7.4 10*3/uL (ref 3.9–10.3)

## 2015-05-14 ENCOUNTER — Encounter: Payer: Self-pay | Admitting: Internal Medicine

## 2015-05-14 ENCOUNTER — Ambulatory Visit (HOSPITAL_BASED_OUTPATIENT_CLINIC_OR_DEPARTMENT_OTHER): Payer: Medicare Other | Admitting: Internal Medicine

## 2015-05-14 ENCOUNTER — Telehealth: Payer: Self-pay | Admitting: Internal Medicine

## 2015-05-14 VITALS — BP 147/81 | HR 97 | Temp 97.5°F | Resp 18 | Ht 64.0 in | Wt 120.7 lb

## 2015-05-14 DIAGNOSIS — N6459 Other signs and symptoms in breast: Secondary | ICD-10-CM

## 2015-05-14 DIAGNOSIS — J449 Chronic obstructive pulmonary disease, unspecified: Secondary | ICD-10-CM

## 2015-05-14 DIAGNOSIS — N6320 Unspecified lump in the left breast, unspecified quadrant: Secondary | ICD-10-CM

## 2015-05-14 DIAGNOSIS — C3411 Malignant neoplasm of upper lobe, right bronchus or lung: Secondary | ICD-10-CM

## 2015-05-14 NOTE — Progress Notes (Signed)
[place] [phone]   [phone]  OFFICE PROGRESS NOTE  [name], MD [address] [place] [address]  DIAGNOSIS: Primary cancer of right lung  Staging form: Lung, AJCC 7th Edition  Clinical stage from 11/01/2014: Stage IIB (T2b, N1, M0) - Signed by Curt Bears, MD on 11/03/2014  PRIOR THERAPY: Course of concurrent chemoradiation with chemotherapy in the form of weekly carboplatin for an AUC of 2 and paclitaxel 45 mg/m given concurrent with radiation for 6-7 weeks. First cycle started 11/19/2014  CURRENT THERAPY:  Observation.  INTERVAL HISTORY: Susan Garrett 80 y.o. female returns to the clinic today for follow-up visit accompanied by her sister. The patient is feeling fine today with no specific complaints except for baseline shortness of breath and she is currently on home oxygen. She is also on inhaler by Dr. Carlis Abbott.. The patient denied having any significant weight loss or night sweats. She has no nausea or vomiting. She denied having any significant fever or chills. She had no chest pain and continues to have mild shortness breath with cough with no hemoptysis. She had repeat CT scan of the chest performed recently and she is here today for evaluation and discussion of her scan results.  MEDICAL HISTORY: Past Medical History  Diagnosis Date  . Hypertension   . Corns and callosities   . Callus   . Hyperthyroidism   . Encounter for antineoplastic chemotherapy 11/13/2014  . Encounter for antineoplastic chemotherapy 11/13/2014    ALLERGIES:  is allergic to shellfish allergy.  MEDICATIONS:  Current Outpatient Prescriptions  Medication Sig Dispense Refill  . ALPRAZolam (XANAX) 0.25 MG tablet TAKE 1 TABLET BY MOUTH AT BEDTIME AND 1 EVERY MORNING AS NEEDED FOR NERVES  0  . aspirin 81 MG tablet Take 81 mg by mouth daily.    [name] BREO ELLIPTA 100-25 MCG/INH AEPB Inhale 1 packet into the lungs daily.  5  .  feeding supplement, ENSURE ENLIVE, (ENSURE ENLIVE) LIQD Take 237 mLs by mouth 2 (two) times daily between meals. 237 mL 12  . methimazole (TAPAZOLE) 5 MG tablet Take 2.5 mg by mouth daily.     . metoprolol succinate (TOPROL-XL) 25 MG 24 hr tablet Take 25 mg by mouth daily.     . polyethylene glycol (MIRALAX / GLYCOLAX) packet Take 17 g by mouth daily.    . sennosides-docusate sodium (SENOKOT-S) 8.6-50 MG tablet Take 1 tablet by mouth daily.    [name] albuterol (PROVENTIL) (2.5 MG/3ML) 0.083% nebulizer solution Take 3 mLs (2.5 mg total) by nebulization every 6 (six) hours as needed for wheezing. (Patient not taking: Reported on 05/14/2015) 75 mL 12   No current facility-administered medications for this visit.    SURGICAL HISTORY:  Past Surgical History  Procedure Laterality Date  . Lesion removal Left 11/12/87  . Vaginal hysterectomy    . Video bronchoscopy Bilateral 10/18/2014    Procedure: VIDEO BRONCHOSCOPY WITH FLUORO;  Surgeon: Tanda Rockers, MD;  Location: [facility];  Service: Endoscopy;  Laterality: Bilateral;    REVIEW OF SYSTEMS:  Constitutional: positive for fatigue Eyes: negative Ears, nose, mouth, throat, and face: negative Respiratory: positive for cough and dyspnea on exertion Cardiovascular: negative Gastrointestinal: negative Genitourinary:negative Integument/breast: negative Hematologic/lymphatic: negative Musculoskeletal:negative Neurological: negative Behavioral/Psych: negative Endocrine: negative Allergic/Immunologic: negative   PHYSICAL EXAMINATION: General appearance: alert, cooperative, fatigued and no distress Head: Normocephalic, without obvious abnormality, atraumatic Neck: no adenopathy, no JVD, supple, symmetrical, trachea midline and thyroid not  enlarged, symmetric, no tenderness/mass/nodules Lymph nodes: Cervical, supraclavicular, and axillary nodes normal. Resp: clear to auscultation bilaterally Back: symmetric, no curvature. ROM normal. No CVA  tenderness. Cardio: regular rate and rhythm, S1, S2 normal, no murmur, click, rub or gallop GI: soft, non-tender; bowel sounds normal; no masses,  no organomegaly Extremities: extremities normal, atraumatic, no cyanosis or edema Neurologic: Alert and oriented X 3, normal strength and tone. Normal symmetric reflexes. Normal coordination and gait  ECOG PERFORMANCE STATUS: 1 - Symptomatic but completely ambulatory  Blood pressure 147/81, pulse 97, temperature 97.5 F (36.4 C), temperature source Oral, resp. rate 18, height '5\' 4"'$  (1.626 m), weight 120 lb 11.2 oz (54.749 kg), SpO2 99 %.  LABORATORY DATA: Lab Results  Component Value Date   WBC 7.4 05/09/2015   HGB 13.3 05/09/2015   HCT 44.3 05/09/2015   MCV 97.6 05/09/2015   PLT 209 05/09/2015      Chemistry      Component Value Date/Time   NA 142 05/09/2015 1006   NA 141 01/16/2015 0520   K 4.3 05/09/2015 1006   K 4.3 01/16/2015 0520   CL 104 01/16/2015 0520   CO2 35* 05/09/2015 1006   CO2 32 01/16/2015 0520   BUN 13.3 05/09/2015 1006   BUN 15 01/16/2015 0520   CREATININE 0.8 05/09/2015 1006   CREATININE 0.51 01/16/2015 0520      Component Value Date/Time   CALCIUM 10.1 05/09/2015 1006   CALCIUM 8.9 01/16/2015 0520   ALKPHOS 92 05/09/2015 1006   AST 26 05/09/2015 1006   ALT 16 05/09/2015 1006   BILITOT 0.74 05/09/2015 1006       RADIOGRAPHIC STUDIES: Ct Chest Wo Contrast  05/09/2015  CLINICAL DATA:  Squamous cell lung carcinoma of the right upper lobe diagnosed on 10/18/2014 status post concurrent chemoradiation therapy, presenting for restaging. EXAM: CT CHEST WITHOUT CONTRAST TECHNIQUE: Multidetector CT imaging of the chest was performed following the standard protocol without IV contrast. COMPARISON:  02/05/2015 chest CT. FINDINGS: Mediastinum/Nodes: Normal heart size. No pericardial fluid/thickening. Left anterior descending coronary atherosclerosis . There is a stable small 0.9 x 0.6 cm penetrating atherosclerotic  ulcer in the left posterior lower descending thoracic aorta (series 602/image 60). Otherwise normal caliber atherosclerotic thoracic aorta. Pulmonary arteries are within normal size limits. Stable 1.3 cm hypodense left thyroid lobe nodule. Fluid level in the mid thoracic esophagus. No pathologically enlarged axillary, mediastinal or gross hilar lymph nodes, noting limited sensitivity for the detection of hilar adenopathy on this noncontrast study. Lungs/Pleura: No pneumothorax. No pleural effusion. Moderate to severe centrilobular emphysema and mild diffuse bronchial wall thickening, unchanged. There is a residual 1.7 x 1.2 cm spiculated right upper lobe pulmonary nodule (series 5/image 24), decreased from 2.0 x 1.5 cm on 02/05/2015. There is stable sharply marginated bandlike consolidation surrounding the spiculated right upper lobe pulmonary nodule, which is slightly decreased and demonstrates associated mild distortion and volume loss, in keeping with evolving radiation fibrosis. Basilar right lower lobe solid 4 mm pulmonary nodule (series 5/ image 52), stable since 10/11/2014. Left lower lobe 3 mm solid pulmonary nodule (series 5/ image 41), which appears faintly calcified and is stable since 10/11/2014. No acute consolidative airspace disease or new significant pulmonary nodules. Upper abdomen: There is a solitary hypodense subcentimeter segment 4A left liver lobe lesion (series 2/ image 55), too small to characterize, stable since 10/11/2014 and probably benign. Musculoskeletal: No aggressive appearing focal osseous lesions. Moderate degenerative changes in the thoracic spine. There is a questionable focal  asymmetry / distortion in the upper left breast (series 2/ image 27). IMPRESSION: 1. Continued decreased size of the spiculated right upper lobe pulmonary nodule, in keeping with continued partial treatment response. Evolving radiation fibrosis surrounding the right upper lobe pulmonary nodule. 2. No  evidence of metastatic disease in the chest. Two tiny lower lobe pulmonary nodules, for which 6 month stability has been demonstrated, suggesting a benign etiology. 3. Questionable focal asymmetry/distortion in the upper left breast, for which correlation with diagnostic mammography is advised. 4. Additional stable findings including one vessel coronary atherosclerosis, small penetrating atherosclerotic ulcer in the lower descending thoracic aorta and moderate to severe centrilobular emphysema. Electronically Signed   By: Ilona Sorrel M.D.   On: 05/09/2015 14:56    ASSESSMENT AND PLAN: This is a very pleasant 80 years old African-American female with unresectable a stage IIB non-small cell lung cancer who completed a course of concurrent chemoradiation with weekly carboplatin and paclitaxel with significant improvement in her disease. The recent CT scan of the chest showed no evidence for disease progression but it showed focal symmetric and distortion in the upper left breast. The patient mentions that she had a history of surgical biopsy in that area in the past. I recommended for her to have her mammogram of the left breast to rule out any abnormality in this area. The patient will continue on observation with repeat CT scan of the chest in 3 months. For the shortness breath, she will continue on home oxygen and I recommended for the patient to reconsult with her pulmonologist regarding her COPD management. She was advised to call immediately if she has any concerning symptoms in the interval. The patient voices understanding of current disease status and treatment options and is in agreement with the current care plan.  All questions were answered. The patient knows to call the clinic with any problems, questions or concerns. We can certainly see the patient much sooner if necessary.  I spent 15 minutes counseling the patient face to face. The total time spent in the appointment was 25  minutes.  Disclaimer: This note was dictated with voice recognition software. Similar sounding words can inadvertently be transcribed and may not be corrected upon review.

## 2015-05-14 NOTE — Telephone Encounter (Signed)
Gave and printed appt sched and avs for pt for March .[name]..breast center line not working pt will sched appt her self

## 2015-05-28 ENCOUNTER — Other Ambulatory Visit: Payer: Self-pay | Admitting: Internal Medicine

## 2015-05-28 DIAGNOSIS — Z1231 Encounter for screening mammogram for malignant neoplasm of breast: Secondary | ICD-10-CM

## 2015-06-26 ENCOUNTER — Ambulatory Visit: Payer: Medicare Other

## 2015-08-13 ENCOUNTER — Other Ambulatory Visit (HOSPITAL_BASED_OUTPATIENT_CLINIC_OR_DEPARTMENT_OTHER): Payer: Medicare Other

## 2015-08-13 DIAGNOSIS — C3411 Malignant neoplasm of upper lobe, right bronchus or lung: Secondary | ICD-10-CM | POA: Diagnosis not present

## 2015-08-13 DIAGNOSIS — N6320 Unspecified lump in the left breast, unspecified quadrant: Secondary | ICD-10-CM

## 2015-08-13 LAB — CBC WITH DIFFERENTIAL/PLATELET
BASO%: 0.4 % (ref 0.0–2.0)
BASOS ABS: 0 10*3/uL (ref 0.0–0.1)
EOS ABS: 0.1 10*3/uL (ref 0.0–0.5)
EOS%: 1.2 % (ref 0.0–7.0)
HEMATOCRIT: 43.2 % (ref 34.8–46.6)
HGB: 13.5 g/dL (ref 11.6–15.9)
LYMPH#: 1.6 10*3/uL (ref 0.9–3.3)
LYMPH%: 20.1 % (ref 14.0–49.7)
MCH: 27.8 pg (ref 25.1–34.0)
MCHC: 31.2 g/dL — AB (ref 31.5–36.0)
MCV: 89.1 fL (ref 79.5–101.0)
MONO#: 0.8 10*3/uL (ref 0.1–0.9)
MONO%: 9.9 % (ref 0.0–14.0)
NEUT#: 5.3 10*3/uL (ref 1.5–6.5)
NEUT%: 68.4 % (ref 38.4–76.8)
PLATELETS: 235 10*3/uL (ref 145–400)
RBC: 4.85 10*6/uL (ref 3.70–5.45)
RDW: 14.8 % — ABNORMAL HIGH (ref 11.2–14.5)
WBC: 7.8 10*3/uL (ref 3.9–10.3)

## 2015-08-13 LAB — COMPREHENSIVE METABOLIC PANEL
ALBUMIN: 3.7 g/dL (ref 3.5–5.0)
ALK PHOS: 79 U/L (ref 40–150)
ALT: 15 U/L (ref 0–55)
AST: 25 U/L (ref 5–34)
Anion Gap: 11 mEq/L (ref 3–11)
BUN: 13.2 mg/dL (ref 7.0–26.0)
CHLORIDE: 98 meq/L (ref 98–109)
CO2: 33 mEq/L — ABNORMAL HIGH (ref 22–29)
Calcium: 10.3 mg/dL (ref 8.4–10.4)
Creatinine: 0.8 mg/dL (ref 0.6–1.1)
EGFR: 82 mL/min/{1.73_m2} — AB (ref >90)
GLUCOSE: 92 mg/dL (ref 70–140)
POTASSIUM: 4.1 meq/L (ref 3.5–5.1)
SODIUM: 142 meq/L (ref 136–145)
Total Bilirubin: 1.09 mg/dL (ref 0.20–1.20)
Total Protein: 7.2 g/dL (ref 6.4–8.3)

## 2015-08-20 ENCOUNTER — Telehealth: Payer: Self-pay | Admitting: Internal Medicine

## 2015-08-20 ENCOUNTER — Ambulatory Visit (HOSPITAL_BASED_OUTPATIENT_CLINIC_OR_DEPARTMENT_OTHER): Payer: Medicare Other | Admitting: Internal Medicine

## 2015-08-20 ENCOUNTER — Encounter: Payer: Self-pay | Admitting: Internal Medicine

## 2015-08-20 VITALS — BP 156/80 | HR 103 | Temp 97.8°F | Resp 18 | Ht 64.0 in | Wt 133.0 lb

## 2015-08-20 DIAGNOSIS — C3491 Malignant neoplasm of unspecified part of right bronchus or lung: Secondary | ICD-10-CM

## 2015-08-20 DIAGNOSIS — C3411 Malignant neoplasm of upper lobe, right bronchus or lung: Secondary | ICD-10-CM

## 2015-08-20 NOTE — Telephone Encounter (Signed)
per pof to sch pt appt-gave pt copy of avs-adv central sch willc all top sch scan

## 2015-08-20 NOTE — Progress Notes (Signed)
[facility] [phone]   [phone]  OFFICE PROGRESS NOTE  [name], MD [address] [place] [address]  DIAGNOSIS: Primary cancer of right lung  Staging form: Lung, AJCC 7th Edition  Clinical stage from 11/01/2014: Stage IIB (T2b, N1, M0) - Signed by Curt Bears, MD on 11/03/2014  PRIOR THERAPY: Course of concurrent chemoradiation with chemotherapy in the form of weekly carboplatin for an AUC of 2 and paclitaxel 45 mg/m given concurrent with radiation for 6-7 weeks. First cycle started 11/19/2014  CURRENT THERAPY:  Observation.  INTERVAL HISTORY: Susan Garrett 81 y.o. female returns to the clinic today for follow-up visit accompanied by her sister. The patient is feeling fine today with no specific complaints except for baseline mild shortness of breath. The patient denied having any significant weight loss or night sweats. She has no nausea or vomiting. She denied having any significant fever or chills. She had no chest pain and continues to have mild shortness breath with cough with no hemoptysis. She was supposed to have repeat CT scan of the chest before this visit but unfortunately the patient missed her appointment.  MEDICAL HISTORY: Past Medical History  Diagnosis Date  . Hypertension   . Corns and callosities   . Callus   . Hyperthyroidism   . Encounter for antineoplastic chemotherapy 11/13/2014  . Encounter for antineoplastic chemotherapy 11/13/2014    ALLERGIES:  is allergic to fish allergy and shellfish allergy.  MEDICATIONS:  Current Outpatient Prescriptions  Medication Sig Dispense Refill  . ALPRAZolam (XANAX) 0.25 MG tablet TAKE 1 TABLET BY MOUTH AT BEDTIME AND 1 EVERY MORNING AS NEEDED FOR NERVES  0  . aspirin 81 MG tablet Take 81 mg by mouth daily.    . feeding supplement, ENSURE ENLIVE, (ENSURE ENLIVE) LIQD Take 237 mLs by mouth 2 (two) times daily between meals. 237 mL 12  . methimazole  (TAPAZOLE) 5 MG tablet Take 2.5 mg by mouth daily.     . metoprolol succinate (TOPROL-XL) 25 MG 24 hr tablet Take 25 mg by mouth daily.     . polyethylene glycol (MIRALAX / GLYCOLAX) packet Take 17 g by mouth daily.    . sennosides-docusate sodium (SENOKOT-S) 8.6-50 MG tablet Take 1 tablet by mouth daily.    . SYMBICORT 160-4.5 MCG/ACT inhaler Inhale 1 puff into the lungs 2 (two) times daily.  5  . albuterol (PROVENTIL) (2.5 MG/3ML) 0.083% nebulizer solution Take 3 mLs (2.5 mg total) by nebulization every 6 (six) hours as needed for wheezing. (Patient not taking: Reported on 05/14/2015) 75 mL 12   No current facility-administered medications for this visit.    SURGICAL HISTORY:  Past Surgical History  Procedure Laterality Date  . Lesion removal Left 11/12/87  . Vaginal hysterectomy    . Video bronchoscopy Bilateral 10/18/2014    Procedure: VIDEO BRONCHOSCOPY WITH FLUORO;  Surgeon: Tanda Rockers, MD;  Location: [facility];  Service: Endoscopy;  Laterality: Bilateral;    REVIEW OF SYSTEMS:  A comprehensive review of systems was negative except for: Constitutional: positive for fatigue Respiratory: positive for dyspnea on exertion   PHYSICAL EXAMINATION: General appearance: alert, cooperative, fatigued and no distress Head: Normocephalic, without obvious abnormality, atraumatic Neck: no adenopathy, no JVD, supple, symmetrical, trachea midline and thyroid not enlarged, symmetric, no tenderness/mass/nodules Lymph nodes: Cervical, supraclavicular, and axillary nodes normal. Resp: clear to auscultation bilaterally Back: symmetric, no curvature. ROM normal. No CVA tenderness. Cardio: regular rate and rhythm,  S1, S2 normal, no murmur, click, rub or gallop GI: soft, non-tender; bowel sounds normal; no masses,  no organomegaly Extremities: extremities normal, atraumatic, no cyanosis or edema Neurologic: Alert and oriented X 3, normal strength and tone. Normal symmetric reflexes. Normal  coordination and gait  ECOG PERFORMANCE STATUS: 1 - Symptomatic but completely ambulatory  Blood pressure 156/80, pulse 103, temperature 97.8 F (36.6 C), temperature source Oral, resp. rate 18, height '5\' 4"'$  (1.626 m), weight 133 lb (60.328 kg), SpO2 100 %.  LABORATORY DATA: Lab Results  Component Value Date   WBC 7.8 08/13/2015   HGB 13.5 08/13/2015   HCT 43.2 08/13/2015   MCV 89.1 08/13/2015   PLT 235 08/13/2015      Chemistry      Component Value Date/Time   NA 142 08/13/2015 1056   NA 141 01/16/2015 0520   K 4.1 08/13/2015 1056   K 4.3 01/16/2015 0520   CL 104 01/16/2015 0520   CO2 33* 08/13/2015 1056   CO2 32 01/16/2015 0520   BUN 13.2 08/13/2015 1056   BUN 15 01/16/2015 0520   CREATININE 0.8 08/13/2015 1056   CREATININE 0.51 01/16/2015 0520      Component Value Date/Time   CALCIUM 10.3 08/13/2015 1056   CALCIUM 8.9 01/16/2015 0520   ALKPHOS 79 08/13/2015 1056   AST 25 08/13/2015 1056   ALT 15 08/13/2015 1056   BILITOT 1.09 08/13/2015 1056       RADIOGRAPHIC STUDIES: No results found.  ASSESSMENT AND PLAN: This is a very pleasant 81 years old African-American female with unresectable a stage IIB non-small cell lung cancer who completed a course of concurrent chemoradiation with weekly carboplatin and paclitaxel with significant improvement in her disease. The patient is currently on observation and has been doing very well with no specific complaints except for her baseline shortness breath. I will arrange for the patient to have repeat CT scan of the chest without contrast in a few days for restaging of her disease and no evidence for disease progression, I would see her back for follow-up visit in 3 months with repeat CT scan of the chest. She was advised to call immediately if she has any concerning symptoms in the interval. The patient voices understanding of current disease status and treatment options and is in agreement with the current care plan.  All  questions were answered. The patient knows to call the clinic with any problems, questions or concerns. We can certainly see the patient much sooner if necessary.  Disclaimer: This note was dictated with voice recognition software. Similar sounding words can inadvertently be transcribed and may not be corrected upon review.

## 2015-08-30 ENCOUNTER — Ambulatory Visit (HOSPITAL_COMMUNITY): Payer: Medicare Other

## 2015-09-02 ENCOUNTER — Ambulatory Visit (HOSPITAL_COMMUNITY)
Admission: RE | Admit: 2015-09-02 | Discharge: 2015-09-02 | Disposition: A | Discharge status: Home / Self Care | Payer: Medicare Other | Source: Ambulatory Visit | Attending: Internal Medicine | Admitting: Internal Medicine

## 2015-09-02 DIAGNOSIS — J439 Emphysema, unspecified: Secondary | ICD-10-CM | POA: Insufficient documentation

## 2015-09-02 DIAGNOSIS — I700 Atherosclerosis of aorta: Secondary | ICD-10-CM | POA: Diagnosis not present

## 2015-09-02 DIAGNOSIS — R918 Other nonspecific abnormal finding of lung field: Secondary | ICD-10-CM | POA: Diagnosis not present

## 2015-09-02 DIAGNOSIS — C3411 Malignant neoplasm of upper lobe, right bronchus or lung: Secondary | ICD-10-CM | POA: Insufficient documentation

## 2015-09-09 ENCOUNTER — Ambulatory Visit
Admission: RE | Admit: 2015-09-09 | Discharge: 2015-09-09 | Disposition: A | Discharge status: Home / Self Care | Payer: Medicare Other | Source: Ambulatory Visit | Attending: Internal Medicine | Admitting: Internal Medicine

## 2015-09-09 DIAGNOSIS — Z1231 Encounter for screening mammogram for malignant neoplasm of breast: Secondary | ICD-10-CM

## 2015-09-25 ENCOUNTER — Encounter: Payer: Self-pay | Admitting: Adult Health

## 2015-09-25 ENCOUNTER — Ambulatory Visit (INDEPENDENT_AMBULATORY_CARE_PROVIDER_SITE_OTHER)
Admission: RE | Admit: 2015-09-25 | Discharge: 2015-09-25 | Disposition: A | Discharge status: Home / Self Care | Payer: Medicare Other | Source: Ambulatory Visit | Attending: Adult Health | Admitting: Adult Health

## 2015-09-25 ENCOUNTER — Ambulatory Visit (INDEPENDENT_AMBULATORY_CARE_PROVIDER_SITE_OTHER): Payer: Medicare Other | Admitting: Adult Health

## 2015-09-25 VITALS — BP 130/78 | HR 104 | Ht 64.0 in | Wt 137.0 lb

## 2015-09-25 DIAGNOSIS — J439 Emphysema, unspecified: Secondary | ICD-10-CM | POA: Diagnosis not present

## 2015-09-25 DIAGNOSIS — J449 Chronic obstructive pulmonary disease, unspecified: Secondary | ICD-10-CM

## 2015-09-25 DIAGNOSIS — C3491 Malignant neoplasm of unspecified part of right bronchus or lung: Secondary | ICD-10-CM

## 2015-09-25 DIAGNOSIS — J9611 Chronic respiratory failure with hypoxia: Secondary | ICD-10-CM

## 2015-09-25 MED ORDER — TIOTROPIUM BROMIDE MONOHYDRATE 18 MCG IN CAPS: 18.0000 ug | ORAL_CAPSULE | Freq: Every day | RESPIRATORY_TRACT | Status: DC

## 2015-09-25 NOTE — Progress Notes (Signed)
Chief Complaint  Patient presents with  . Hospitalization Follow-up     Tests Spirometry 10/12/14>> fev1 0.5 (27%) ratio 39  CT chest 3/27>> 1. Mild decrease in size of right upper lobe index lung lesion. 2. Smaller pulmonary nodules remain stable. 3. Aortic atherosclerosis 4. Emphysema.   Spirometry 09/25/15>>> fev1 0.48 (29%), fev1/fvc 40%  Past medical hx Past Medical History  Diagnosis Date  . Hypertension   . Corns and callosities   . Callus   . Hyperthyroidism   . Encounter for antineoplastic chemotherapy 11/13/2014  . Encounter for antineoplastic chemotherapy 11/13/2014     Past surgical hx, Allergies, Family hx, Social hx all reviewed.  Current Outpatient Prescriptions on File Prior to Visit  Medication Sig  . ALPRAZolam (XANAX) 0.25 MG tablet TAKE 1 TABLET BY MOUTH AT BEDTIME AND 1 EVERY MORNING AS NEEDED FOR NERVES  . aspirin 81 MG tablet Take 81 mg by mouth daily.  . methimazole (TAPAZOLE) 5 MG tablet Take 2.5 mg by mouth daily.   . metoprolol succinate (TOPROL-XL) 25 MG 24 hr tablet Take 25 mg by mouth daily.   . OXYGEN Inhale 2 L into the lungs continuous.  . polyethylene glycol (MIRALAX / GLYCOLAX) packet Take 17 g by mouth daily.  . sennosides-docusate sodium (SENOKOT-S) 8.6-50 MG tablet Take 1 tablet by mouth daily.  . SYMBICORT 160-4.5 MCG/ACT inhaler Inhale 1 puff into the lungs 2 (two) times daily.   No current facility-administered medications on file prior to visit.     Vital Signs BP 130/78 mmHg  Pulse 104  Ht '5\' 4"'$  (1.626 m)  Wt 62.143 kg (137 lb)  BMI 23.50 kg/m2  SpO2 97%  History of Present Illness Susan Garrett is a 81 y.o. female former smoker with hx HTN, COPD,Stage IIB RUL NSC lung cancer s/o chemo and xrt (completed >6 months ago).  Comes in today for follow up at the request of her PCP.  She has been doing well but has noticed worsening DOE over the last several weeks.  She is a poor historian but according to pt and her sister she was  doing well until she was in the hospital "last year" (august 2016) and she required home O2 on discharge.  She has completed her chemo and radiation with Dr. Earlie Server.  SHe remains on 2L [place] and feels like she "needs more".   On a normal day - denies dyspnea at rest but gets easily SOB and fatigued with any activity.  She remains active and drives herself but has to "sit in the car for a minute" to catch her breath after walking to the car.  Gets SOB walking up 1 flight of stairs.    Denies cough, purulent sputum, fevers, chills, chest pain, hemoptysis, orthopnea, PND.   Does c/o mild BLE edema which is normal for her.    Former smoker -- ~35 pack year smoking hx.  Quit 2015, started age 60 but smoked only 1/2 ppd on average   Physical Exam  General - pleasant elderly female, No distress  ENT - No sinus tenderness, no oral exudate, no LAN Cardiac - s1s2 regular, no murmur Chest - resps even non labored on 2L , few faint exp wheeze, slightly diminished bases Back - No focal tenderness Abd - Soft, non-tender Ext - 1+ BLE edema  Neuro - Normal strength Skin - No rashes Psych - normal mood, and behavior   Assessment/Plan  Chronic hypoxic respiratory failure -- multifactorial in setting severe COPD, lung  cancer, deconditioning. Will add spiriva.  Check CXR today given mild BLE edema. Consider pulmonary rehab.    COPD RUL NCS lung cancer    Patient Instructions  We will do a chest xray today on your way out  We will have our patient care coordinator try to get you a smaller O2 tank  Will add spiriva inhaler - inhale once daily  Follow up with Dr. Melvyn Novas in 2-3 months      Susan Madrid, NP 09/25/2015  9:54 AM

## 2015-09-25 NOTE — Patient Instructions (Addendum)
We will do a chest xray today on your way out  We will have our patient care coordinator try to get you a smaller O2 tank  Will add spiriva inhaler - inhale once daily  Follow up with Dr. Melvyn Novas in 2-3 months

## 2015-09-26 NOTE — Progress Notes (Signed)
Chart and office note reviewed in detail along with available xrays  > agree with a/p as outlined  

## 2015-10-07 ENCOUNTER — Telehealth: Payer: Self-pay | Admitting: Internal Medicine

## 2015-10-07 NOTE — Telephone Encounter (Signed)
Spoke with pt, states she was tested by [facility] for a smaller more portable 02 system, states she did not qualify for a smaller system.   Pt requesting we reach out to [facility] for these results, as she was told by [facility] that they did not have a fax number for [facility], states that she did not pass the evaluation and is still using D-tanks.  Was told by respiratory that she used up to 6lpm pulsed 02.  Did not tolerate a conserving device.    Forwarding to MW as Juluis Rainier.

## 2015-10-07 NOTE — Telephone Encounter (Signed)
Spoke with pt, scheduled for Thursday with MW to discuss 02.  Nothing further needed.

## 2015-10-07 NOTE — Telephone Encounter (Signed)
No additional recs but would change f/u to < 2 weeks with all active meds/ solutions/ inhalers in hand to regroup

## 2015-10-10 ENCOUNTER — Ambulatory Visit (INDEPENDENT_AMBULATORY_CARE_PROVIDER_SITE_OTHER): Payer: Medicare Other | Admitting: Internal Medicine

## 2015-10-10 ENCOUNTER — Encounter: Payer: Self-pay | Admitting: Internal Medicine

## 2015-10-10 VITALS — BP 140/90 | HR 111 | Ht 64.0 in | Wt 136.0 lb

## 2015-10-10 DIAGNOSIS — J9611 Chronic respiratory failure with hypoxia: Secondary | ICD-10-CM | POA: Diagnosis not present

## 2015-10-10 DIAGNOSIS — J449 Chronic obstructive pulmonary disease, unspecified: Secondary | ICD-10-CM | POA: Diagnosis not present

## 2015-10-10 DIAGNOSIS — J9612 Chronic respiratory failure with hypercapnia: Secondary | ICD-10-CM

## 2015-10-10 NOTE — Progress Notes (Signed)
   Subjective:    Patient ID: Susan Garrett, female    DOB: 1934-10-12    MRN: [id number]    Brief patient profile:  52 yobf  Quit smoking completely 10/2014 with GOLD IV copd dx  10/2014      10/10/2015  f/u ov/Wert re:  COPD GOLD IV criteria/ maint rx = spiriva/symb160  Chief Complaint  Patient presents with  . Follow-up    Breathing is unchanged since her last ov. She reports that she did not qualify for POC and DME rec that she make another appt here.    Sleep ok AM cough/congestion x none Doe =  Steps/ does ok slow on 02 / struggles picking up 02   No obvious day to day or daytime variability or assoc excess/ purulent sputum or mucus plugs or hemoptysis or cp or chest tightness, subjective wheeze or overt sinus or hb symptoms. No unusual exp hx or h/o childhood pna/ asthma or knowledge of premature birth.  Sleeping ok without nocturnal  or early am exacerbation  of respiratory  c/o's or need for noct saba. Also denies any obvious fluctuation of symptoms with weather or environmental changes or other aggravating or alleviating factors except as outlined above   Current Medications, Allergies, Complete Past Medical History, Past Surgical History, Family History, and Social History were reviewed in [employer] record.  ROS  The following are not active complaints unless bolded sore throat, dysphagia, dental problems, itching, sneezing,  nasal congestion or excess/ purulent secretions, ear ache,   fever, chills, sweats, unintended wt loss, classically pleuritic or exertional cp,  orthopnea pnd or leg swelling, presyncope, palpitations, abdominal pain, anorexia, nausea, vomiting, diarrhea  or change in bowel or bladder habits, change in stools or urine, dysuria,hematuria,  rash, arthralgias, visual complaints, headache, numbness, weakness or ataxia or problems with walking or coordination,  change in mood/affect or memory.                    Objective:   Physical Exam   amb bf nad/ quite thin and anxious, not clear she's understanding potential  impact of her problem   10/10/2015          136   10/12/14 109 lb (49.442 kg)  02/27/14 120 lb (54.432 kg)  01/30/14 119 lb (53.978 kg)    Vital signs reviewed   HEENT: nl dentition, turbinates, and orophanx. Nl external ear canals without cough reflex   NECK :  without JVD/Nodes/TM/ nl carotid upstrokes bilaterally   LUNGS: no acc muscle use, very distant bs bilaterally  CV:  RRR  no s3 or murmur or increase in P2, no edema   ABD:  soft and nontender with nl excursion in the supine position. No bruits or organomegaly, bowel sounds nl  MS:  warm without deformities, calf tenderness, cyanosis or clubbing  SKIN: warm and dry without lesions    NEURO:  alert, approp, no deficits       I personally reviewed images and agree with radiology impression as follows:  CXR:  09/25/15 Hyperexpansion of the lungs is noted consistent with chronic obstructive pulmonary disease. Stable right upper lobe density is noted consistent with history of malignancy. No acute cardiopulmonary abnormality seen.      Assessment & Plan:

## 2015-10-10 NOTE — Patient Instructions (Addendum)
Plan A = Automatic = Symbicort 160 Take 2 puffs first thing in am and then another 2 puffs about 12 hours later.  Spiriva each am     Plan B = Backup - only use your albuterol nebulizer if you first try Plan B and it fails to help > ok to use the nebulizer up to every 4 hours but if start needing it regularly call for immediate appointment  Please schedule a follow up office visit in 2 weeks, sooner if needed to see NP  - if better inhaler technique consider change to stiolto vs bevespi vs anoro depending on insurance/ preference for the different options

## 2015-10-25 ENCOUNTER — Ambulatory Visit (INDEPENDENT_AMBULATORY_CARE_PROVIDER_SITE_OTHER): Payer: Medicare Other | Admitting: Internal Medicine

## 2015-10-25 ENCOUNTER — Encounter: Payer: Self-pay | Admitting: Internal Medicine

## 2015-10-25 VITALS — BP 136/88 | HR 107 | Ht 64.0 in | Wt 138.8 lb

## 2015-10-25 DIAGNOSIS — J9611 Chronic respiratory failure with hypoxia: Secondary | ICD-10-CM | POA: Diagnosis not present

## 2015-10-25 DIAGNOSIS — J9612 Chronic respiratory failure with hypercapnia: Secondary | ICD-10-CM | POA: Diagnosis not present

## 2015-10-25 DIAGNOSIS — J449 Chronic obstructive pulmonary disease, unspecified: Secondary | ICD-10-CM | POA: Diagnosis not present

## 2015-10-25 MED ORDER — ALBUTEROL SULFATE (2.5 MG/3ML) 0.083% IN NEBU: 2.5000 mg | INHALATION_SOLUTION | Freq: Four times a day (QID) | RESPIRATORY_TRACT | Status: DC | PRN

## 2015-10-25 MED ORDER — GLYCOPYRROLATE-FORMOTEROL 9-4.8 MCG/ACT IN AERO: 2.0000 | INHALATION_SPRAY | Freq: Two times a day (BID) | RESPIRATORY_TRACT | Status: DC

## 2015-10-25 NOTE — Patient Instructions (Addendum)
Plan A = Automatic = Bevespi Take 2 puffs first thing in am and then another 2 puffs about 12 hours later.   Work on inhaler technique:  relax and gently blow all the way out then take a nice smooth deep breath back in, triggering the inhaler at same time you start breathing in.  Hold for up to 5 seconds if you can. Blow out thru nose. Rinse and gargle with water when done     Plan B = Backup .Only use your albuterol neb as a rescue medication to be used if you can't catch your breath by resting or doing a relaxed purse lip breathing pattern.  - The less you use it, the better it will work when you need it. - Ok to use up to   every 4 hours if you must but call for immediate appointment if use goes up over your usual need  Please see patient coordinator before you leave today  to schedule best fit for ?  POC       Please schedule a follow up office visit in 4 weeks, sooner if needed with all meds/neb solutions to see our NP  Late Add:  consider stiolto depending on insurance/ hfa technique, avoid anoro for now due to hoaseness from spiriva dpi ? Willing to go to rehab?

## 2015-10-25 NOTE — Progress Notes (Signed)
Subjective:    Patient ID: Susan Garrett, female    DOB: 1934-11-09    MRN: [id number]    Brief patient profile:  67 yobf  Quit smoking completely 10/2014  with FTT x 2015 and new RUL mass on eval of 40 lb wt loss seen at request of Dr Carlis Abbott for lung mass / nl wt around 160   - rec FOB for 10/18/14 >no airway involvement > TBBx  RUL pos Sq cell ca  - PET 11/01/2014  The large spiculated right upper lobe mass is hypermetabolic consistent with primary bronchogenic carcinoma. There is indeterminate low level activity within the right hilum which could reflect nodal metastasis> referred to oncology   DIAGNOSIS: Primary cancer of right lung  Clinical stage from 11/01/2014: Stage IIB (T2b, N1, M0)  PRIOR THERAPY: Course of concurrent chemoradiation with chemotherapy in the form of weekly carboplatin for an AUC of 2 and paclitaxel 45 mg/m given concurrent with radiation for 6-7 weeks. First cycle started 11/19/2014  CURRENT THERAPY: Observation.    10/10/2015  f/u ov/Susan Garrett re:  COPD GOLD IV criteria/ maint rx = spiriva/symb160  Chief Complaint  Patient presents with  . Follow-up    Breathing is unchanged since her last ov. She reports that she did not qualify for POC and DME rec that she make another appt here.    Sleep ok AM cough/congestion x none Doe =  Steps/ does ok slow on 02 / struggles picking up 02  Plan A = Automatic = Symbicort 160 Take 2 puffs first thing in am and then another 2 puffs about 12 hours later.  Spiriva each am  Plan B = Backup - only use your albuterol nebulizer if you first try Plan B and it fails to help > ok to use the nebulizer up to every 4 hours but if start needing it regularly call for immediate appointment Please schedule a follow up office visit in 2 weeks, sooner if needed to see NP  - if better inhaler technique consider change to stiolto vs bevespi vs anoro depending on insurance/ preference for the different options    10/25/2015  f/u ov/Susan Garrett re:  COPD GOLD IV / maint symb 160/spiriva dpi 02 2lpm 24/7  Chief Complaint  Patient presents with  . Follow-up    Breathing is unchanged. No new co's today.   hoarseness worse in am  Doe = MMRC2 = can't walk a nl pace on a flat grade s sob but does fine slow and flat eg  grocery shopping but struggles with carrying portable 02  No obvious day to day or daytime variability or assoc excess/ purulent sputum or mucus plugs or hemoptysis or cp or chest tightness, subjective wheeze or overt sinus or hb symptoms. No unusual exp hx or h/o childhood pna/ asthma or knowledge of premature birth.  Sleeping ok without nocturnal  or early am exacerbation  of respiratory  c/o's or need for noct saba. Also denies any obvious fluctuation of symptoms with weather or environmental changes or other aggravating or alleviating factors except as outlined above   Current Medications, Allergies, Complete Past Medical History, Past Surgical History, Family History, and Social History were reviewed in [employer] record.  ROS  The following are not active complaints unless bolded sore throat, dysphagia, dental problems, itching, sneezing,  nasal congestion or excess/ purulent secretions, ear ache,   fever, chills, sweats, unintended wt loss, classically pleuritic or exertional cp,  orthopnea pnd or leg  swelling, presyncope, palpitations, abdominal pain, anorexia, nausea, vomiting, diarrhea  or change in bowel or bladder habits, change in stools or urine, dysuria,hematuria,  rash, arthralgias, visual complaints, headache, numbness, weakness or ataxia or problems with walking or coordination,  change in mood/affect or memory.                    Objective:   Physical Exam   amb moderately hoarse bf nad/    10/25/2015       139  10/10/2015          136   10/12/14 109 lb (49.442 kg)  02/27/14 120 lb (54.432 kg)  01/30/14 119 lb (53.978 kg)    Vital signs reviewed   HEENT: nl dentition,  turbinates, and orophanx. Nl external ear canals without cough reflex   NECK :  without JVD/Nodes/TM/ nl carotid upstrokes bilaterally   LUNGS: no acc muscle use, clear to A and P bilaterally without cough on insp or exp maneuvers   CV:  RRR  no s3 or murmur or increase in P2, no edema   ABD:  soft and nontender with nl excursion in the supine position. No bruits or organomegaly, bowel sounds nl  MS:  warm without deformities, calf tenderness, cyanosis or clubbing  SKIN: warm and dry without lesions    NEURO:  alert, approp, no deficits       I personally reviewed images and agree with radiology impression as follows:  CXR:  09/25/15 Hyperexpansion of the lungs is noted consistent with chronic obstructive pulmonary disease. Stable right upper lobe density is noted consistent with history of malignancy. No acute cardiopulmonary abnormality seen.      Assessment & Plan:

## 2015-10-27 ENCOUNTER — Encounter: Payer: Self-pay | Admitting: Internal Medicine

## 2015-10-27 DIAGNOSIS — J9612 Chronic respiratory failure with hypercapnia: Secondary | ICD-10-CM

## 2015-10-27 DIAGNOSIS — J9611 Chronic respiratory failure with hypoxia: Secondary | ICD-10-CM | POA: Insufficient documentation

## 2015-10-27 NOTE — Assessment & Plan Note (Signed)
Adequate control on present rx, reviewed > no change in rx needed  = 2lpm 24/7 did not qualify for POC previously > should be able to now

## 2015-10-27 NOTE — Assessment & Plan Note (Signed)
Adequate control on present rx, reviewed > no change in rx needed   

## 2015-10-27 NOTE — Assessment & Plan Note (Signed)
Spirometry 10/12/14  fev1  0.5 (27%) ratio 39  Very severe copd / 02 dep with hypercarbia and yet relatively well compensated and may even turn out to be more of a GROUP B  For now needs work on Research scientist (medical) using the meds  - The proper method of use, as well as anticipated side effects, of a metered-dose inhaler are discussed and demonstrated to the patient. Improved effectiveness after extensive coaching during this visit to a level of approximately 75 % from a baseline of 50 % > try symb 160 2bid and spiriva   I had an extended discussion with the patient reviewing all relevant studies completed to date and  lasting 15 to 20 minutes of a 25 minute visit    Each maintenance medication was reviewed in detail including most importantly the difference between maintenance and prns and under what circumstances the prns are to be triggered using an action plan format that is not reflected in the computer generated alphabetically organized AVS.    Please see instructions for details which were reviewed in writing and the patient given a copy highlighting the part that I personally wrote and discussed at today's ov.

## 2015-10-27 NOTE — Assessment & Plan Note (Addendum)
Spirometry 10/12/14  fev1  0.5 (27%) ratio 39  10/25/2015  After extensive coaching HFA effectiveness =    75% > try BEVESPI    She mostly fits a GOLD B pattern now s tendency to exac and hoarseness that is either from the ICS or the DPI so best choice is bevespi as the LABA/LAMA with stiolto another option.  Also need to consider pulmonary rehab next ov if she's willing   I had an extended discussion with the patient reviewing all relevant studies completed to date and  lasting 15 to 20 minutes of a 25 minute visit on the following ongoing concerns:   Formulary restrictions will be an ongoing challenge for the forseable future and I would be happy to pick an alternative if the pt will first  provide me a list of them but pt  will need to return here for training for any new device that is required eg dpi vs hfa vs respimat.    In meantime we can always provide samples so the patient never runs out of any needed respiratory medications.   Each maintenance medication was reviewed in detail including most importantly the difference between maintenance and as needed and under what circumstances the prns are to be used.  Please see instructions for details which were reviewed in writing and the patient given a copy.

## 2015-11-14 ENCOUNTER — Telehealth: Payer: Self-pay | Admitting: *Deleted

## 2015-11-14 DIAGNOSIS — C3411 Malignant neoplasm of upper lobe, right bronchus or lung: Secondary | ICD-10-CM

## 2015-11-14 NOTE — Telephone Encounter (Signed)
Pt called states 'I'm suppose to have a CT scan prior to seeing MD and I have not received a call." At last office visit MD progress note states pt is to f/u in 3 months with CT. New Order entered. POF to scheduling

## 2015-11-15 ENCOUNTER — Telehealth: Payer: Self-pay | Admitting: *Deleted

## 2015-11-15 ENCOUNTER — Other Ambulatory Visit: Payer: Self-pay | Admitting: Medical Oncology

## 2015-11-15 ENCOUNTER — Telehealth: Payer: Self-pay | Admitting: Internal Medicine

## 2015-11-15 NOTE — Telephone Encounter (Signed)
s.w. pt and r/s MD visit to first available....pt ok and aware

## 2015-11-15 NOTE — Telephone Encounter (Signed)
"  I was called and notified about an appointment 11-25-2015 with Dr. Julien Nordmann at 11:30 am.  I already have an appointment with another doctor at the same time.  I also have not ben scheduled for the CTscans and need to see Dr. Julien Nordmann after the scans.  Return number [phone].

## 2015-11-15 NOTE — Telephone Encounter (Signed)
I told pt tha ti snet schedule request to r/s June 19th . i also confirmed her lab and ct scan with her.

## 2015-11-18 ENCOUNTER — Other Ambulatory Visit (HOSPITAL_BASED_OUTPATIENT_CLINIC_OR_DEPARTMENT_OTHER): Payer: Medicare Other

## 2015-11-18 ENCOUNTER — Ambulatory Visit (HOSPITAL_COMMUNITY)
Admission: RE | Admit: 2015-11-18 | Discharge: 2015-11-18 | Disposition: A | Discharge status: Home / Self Care | Payer: Medicare Other | Source: Ambulatory Visit | Attending: Internal Medicine | Admitting: Internal Medicine

## 2015-11-18 DIAGNOSIS — C3491 Malignant neoplasm of unspecified part of right bronchus or lung: Secondary | ICD-10-CM | POA: Diagnosis not present

## 2015-11-18 DIAGNOSIS — R918 Other nonspecific abnormal finding of lung field: Secondary | ICD-10-CM | POA: Diagnosis not present

## 2015-11-18 DIAGNOSIS — C3411 Malignant neoplasm of upper lobe, right bronchus or lung: Secondary | ICD-10-CM | POA: Diagnosis present

## 2015-11-18 LAB — COMPREHENSIVE METABOLIC PANEL
ALK PHOS: 77 U/L (ref 40–150)
ALT: 22 U/L (ref 0–55)
AST: 26 U/L (ref 5–34)
Albumin: 3.6 g/dL (ref 3.5–5.0)
Anion Gap: 6 mEq/L (ref 3–11)
BUN: 11.9 mg/dL (ref 7.0–26.0)
CHLORIDE: 102 meq/L (ref 98–109)
CO2: 33 meq/L — AB (ref 22–29)
Calcium: 9.9 mg/dL (ref 8.4–10.4)
Creatinine: 0.8 mg/dL (ref 0.6–1.1)
EGFR: 82 mL/min/{1.73_m2} — AB (ref >90)
GLUCOSE: 86 mg/dL (ref 70–140)
POTASSIUM: 4.1 meq/L (ref 3.5–5.1)
SODIUM: 142 meq/L (ref 136–145)
Total Bilirubin: 0.8 mg/dL (ref 0.20–1.20)
Total Protein: 7.1 g/dL (ref 6.4–8.3)

## 2015-11-18 LAB — CBC WITH DIFFERENTIAL/PLATELET
BASO%: 0.3 % (ref 0.0–2.0)
BASOS ABS: 0 10*3/uL (ref 0.0–0.1)
EOS%: 2.3 % (ref 0.0–7.0)
Eosinophils Absolute: 0.2 10*3/uL (ref 0.0–0.5)
HCT: 45.2 % (ref 34.8–46.6)
HGB: 14 g/dL (ref 11.6–15.9)
LYMPH#: 1.8 10*3/uL (ref 0.9–3.3)
LYMPH%: 24.6 % (ref 14.0–49.7)
MCH: 27.5 pg (ref 25.1–34.0)
MCHC: 31 g/dL — AB (ref 31.5–36.0)
MCV: 88.8 fL (ref 79.5–101.0)
MONO#: 0.8 10*3/uL (ref 0.1–0.9)
MONO%: 10.4 % (ref 0.0–14.0)
NEUT#: 4.5 10*3/uL (ref 1.5–6.5)
NEUT%: 62.4 % (ref 38.4–76.8)
Platelets: ADEQUATE 10*3/uL (ref 145–400)
RBC: 5.09 10*6/uL (ref 3.70–5.45)
RDW: 13.8 % (ref 11.2–14.5)
WBC: 7.2 10*3/uL (ref 3.9–10.3)
nRBC: 0 % (ref 0–0)

## 2015-11-19 ENCOUNTER — Telehealth: Payer: Self-pay | Admitting: Internal Medicine

## 2015-11-19 NOTE — Telephone Encounter (Signed)
called pt to advised that md has availability b4 6.29....Marland Kitchenno vm

## 2015-11-20 ENCOUNTER — Telehealth: Payer: Self-pay | Admitting: Internal Medicine

## 2015-11-20 NOTE — Telephone Encounter (Signed)
Spoke with pt, states she is almost out of her Bevespi sample, and per her pharmacy she needs a PA on this medication.  More samples placed up front for pt to pick up.  Called [facility], PA form being faxed to office-verified fax #.  Will hold in triage to ensure receipt of PA form

## 2015-11-21 NOTE — Telephone Encounter (Signed)
I received the PA form  She has ov with TP for Monday 6/19 and was advised to bring her formulary to see what is preferred  I will hold on to PA form in case we actually need to do PA  Pt okay with this and has enough samples to get her through the next couple of wks

## 2015-11-25 ENCOUNTER — Ambulatory Visit: Payer: Medicare Other | Admitting: Internal Medicine

## 2015-11-25 ENCOUNTER — Encounter: Payer: Self-pay | Admitting: Adult Health

## 2015-11-25 ENCOUNTER — Ambulatory Visit (INDEPENDENT_AMBULATORY_CARE_PROVIDER_SITE_OTHER): Payer: Medicare Other | Admitting: Adult Health

## 2015-11-25 ENCOUNTER — Telehealth: Payer: Self-pay | Admitting: Adult Health

## 2015-11-25 VITALS — HR 114 | Temp 97.7°F | Ht 64.0 in | Wt 139.0 lb

## 2015-11-25 DIAGNOSIS — J9611 Chronic respiratory failure with hypoxia: Secondary | ICD-10-CM | POA: Diagnosis not present

## 2015-11-25 DIAGNOSIS — C3411 Malignant neoplasm of upper lobe, right bronchus or lung: Secondary | ICD-10-CM

## 2015-11-25 DIAGNOSIS — J9612 Chronic respiratory failure with hypercapnia: Secondary | ICD-10-CM | POA: Diagnosis not present

## 2015-11-25 DIAGNOSIS — J449 Chronic obstructive pulmonary disease, unspecified: Secondary | ICD-10-CM | POA: Diagnosis not present

## 2015-11-25 NOTE — Telephone Encounter (Signed)
Pt was seen in the office today with TP.  Per verbal order from TP  Start PA for [employer] at [phone] and spoke with Mitzi Hansen. I initiated the PA for Bevespi. Mitzi Hansen states that a response will be given in the next 72 hours. Nothing further is needed at this time. Will await fax or call back for response

## 2015-11-25 NOTE — Assessment & Plan Note (Signed)
Cont on O2 at 2 continuous flow .

## 2015-11-25 NOTE — Assessment & Plan Note (Signed)
Most recent CT chest stable  follow up with oncology as planned

## 2015-11-25 NOTE — Patient Instructions (Addendum)
Continue on Bevespi 2 puffs Twice daily  , rinse after use.  Follow up with Dr. Julien Nordmann as planned  follow up Dr. Melvyn Novas  In 3-4 months and As needed

## 2015-11-25 NOTE — Assessment & Plan Note (Signed)
Severe COPD compensated on Bevespi  Await insurance approval   Plan  Continue on Bevespi 2 puffs Twice daily  , rinse after use.  Follow up with Dr. Julien Nordmann as planned  follow up Dr. Melvyn Novas  In 3-4 months and As needed

## 2015-11-25 NOTE — Progress Notes (Signed)
Subjective:    Patient ID: Susan Garrett, female    DOB: February 04, 1935    MRN: [id number]    Brief patient profile:  4 yobf  Quit smoking completely 10/2014  with FTT x 2015 and new RUL mass on eval of 40 lb wt loss seen at request of Dr Carlis Abbott for lung mass / nl wt around 160   - rec FOB for 10/18/14 >no airway involvement > TBBx  RUL pos Sq cell ca  - PET 11/01/2014  The large spiculated right upper lobe mass is hypermetabolic consistent with primary bronchogenic carcinoma. There is indeterminate low level activity within the right hilum which could reflect nodal metastasis> referred to oncology   DIAGNOSIS: Primary cancer of right lung  Clinical stage from 11/01/2014: Stage IIB (T2b, N1, M0)  PRIOR THERAPY: Course of concurrent chemoradiation with chemotherapy in the form of weekly carboplatin for an AUC of 2 and paclitaxel 45 mg/m given concurrent with radiation for 6-7 weeks. First cycle started 11/19/2014  CURRENT THERAPY: Observation.    10/10/2015  f/u ov/Wert re:  COPD GOLD IV criteria/ maint rx = spiriva/symb160  Chief Complaint  Patient presents with  . Follow-up    Breathing is unchanged since her last ov. She reports that she did not qualify for POC and DME rec that she make another appt here.    Sleep ok AM cough/congestion x none Doe =  Steps/ does ok slow on 02 / struggles picking up 02  Plan A = Automatic = Symbicort 160 Take 2 puffs first thing in am and then another 2 puffs about 12 hours later.  Spiriva each am  Plan B = Backup - only use your albuterol nebulizer if you first try Plan B and it fails to help > ok to use the nebulizer up to every 4 hours but if start needing it regularly call for immediate appointment Please schedule a follow up office visit in 2 weeks, sooner if needed to see NP  - if better inhaler technique consider change to stiolto vs bevespi vs anoro depending on insurance/ preference for the different options    10/25/2015  f/u ov/Wert re:  COPD GOLD IV / maint symb 160/spiriva dpi 02 2lpm 24/7  Chief Complaint  Patient presents with  . Follow-up    Breathing is unchanged. No new co's today.   hoarseness worse in am  Doe = MMRC2 = can't walk a nl pace on a flat grade s sob but does fine slow and flat eg  grocery shopping but struggles with carrying portable 02 >>  11/25/2015 Follow up : COPD GOLD IV, Lung cancer.  Patient returns for a one-month follow-up. She was recently changed from Symbicort and Spiriva to [place] .  Did not qualify for POC . Says she desated with walking on POC. Discussed pursed lip breathing exercises.  Remains on oxygen 2l/m . With regular tanks.  Her insurance required prior authorization for [place] . We are waiting for approval but if not  Formulary shows coverage to [facility] and [employer].   Patient has a history of right lung cancer stage IIB diagnosed in May 2016 followed by Dr. Inda Merlin. She is status post chemotherapy and radiation Most recent CT chest 11/18/2015 showed stable pulmonary nodules in the right upper lobe measuring 15 mm  Declines Pneumovax and Prevnar.   Good appetite , has regained back her weightl .   Rare albuterol use.     Current Medications, Allergies, Complete Past Medical History,  Past Surgical History, Family History, and Social History were reviewed in [employer] record.  ROS  The following are not active complaints unless bolded sore throat, dysphagia, dental problems, itching, sneezing,  nasal congestion or excess/ purulent secretions, ear ache,   fever, chills, sweats, unintended wt loss, classically pleuritic or exertional cp,  orthopnea pnd or leg swelling, presyncope, palpitations, abdominal pain, anorexia, nausea, vomiting, diarrhea  or change in bowel or bladder habits, change in stools or urine, dysuria,hematuria,  rash, arthralgias, visual complaints, headache, numbness, weakness or ataxia or problems with walking or coordination,  change  in mood/affect or memory.                    Objective:   Physical Exam   amb bf nad/   . Filed Vitals:   11/25/15 1149  Pulse: 114  Temp: 97.7 F (36.5 C)  TempSrc: Oral  Height: '5\' 4"'$  (1.626 m)  Weight: 139 lb (63.05 kg)  SpO2: 97%   l Vital signs reviewed   HEENT: nl dentition, turbinates, and orophanx. Nl external ear canals without cough reflex   NECK :  without JVD/Nodes/TM/ nl carotid upstrokes bilaterally   LUNGS: no acc muscle use, clear to A and P bilaterally without cough on insp or exp maneuvers   CV:  RRR  no s3 or murmur or increase in P2, no edema   ABD:  soft and nontender with nl excursion in the supine position. No bruits or organomegaly, bowel sounds nl  MS:  warm without deformities, calf tenderness, cyanosis or clubbing  SKIN: warm and dry without lesions    NEURO:  alert, approp, no deficits        CXR:  09/25/15 Hyperexpansion of the lungs is noted consistent with chronic obstructive pulmonary disease. Stable right upper lobe density is noted consistent with history of malignancy. No acute cardiopulmonary abnormality seen.   Orilla Templeman NP-C  [facility]  11/25/2015

## 2015-11-25 NOTE — Progress Notes (Signed)
Chart and office note reviewed in detail  > agree with a/p as outlined    

## 2015-12-02 NOTE — Telephone Encounter (Signed)
Susan Garrett has been denied. States pt must try and fail Stiolto and Anoro.

## 2015-12-04 NOTE — Telephone Encounter (Signed)
Pt aware of change in medication - Plans to come by and pick up samples of Stiolto tomorrow 12/05/15 Will need demonstration when she picks these up.  Samples placed up front and instructions written on bag. Nothing further needed.

## 2015-12-04 NOTE — Telephone Encounter (Signed)
That is fine , please call pt and let them know .  Can try Stiolto 2 puffs daily .  Make sure she has follow up in office in 3 months .

## 2015-12-05 ENCOUNTER — Ambulatory Visit (HOSPITAL_BASED_OUTPATIENT_CLINIC_OR_DEPARTMENT_OTHER): Payer: Medicare Other | Admitting: Internal Medicine

## 2015-12-05 ENCOUNTER — Encounter: Payer: Self-pay | Admitting: Internal Medicine

## 2015-12-05 ENCOUNTER — Telehealth: Payer: Self-pay | Admitting: Internal Medicine

## 2015-12-05 VITALS — BP 153/87 | HR 105 | Temp 98.3°F | Resp 18 | Ht 64.0 in | Wt 139.6 lb

## 2015-12-05 DIAGNOSIS — C3411 Malignant neoplasm of upper lobe, right bronchus or lung: Secondary | ICD-10-CM

## 2015-12-05 DIAGNOSIS — C3491 Malignant neoplasm of unspecified part of right bronchus or lung: Secondary | ICD-10-CM

## 2015-12-05 NOTE — Telephone Encounter (Signed)
Gave and pritned appt sched and avs for pt for [facility]

## 2015-12-05 NOTE — Progress Notes (Signed)
[place] [phone]   [phone]  OFFICE PROGRESS NOTE  [name], MD [address] [place] [address]  DIAGNOSIS: Primary cancer of right lung  Staging form: Lung, AJCC 7th Edition  Clinical stage from 11/01/2014: Stage IIB (T2b, N1, M0) - Signed by Curt Bears, MD on 11/03/2014  PRIOR THERAPY: Course of concurrent chemoradiation with chemotherapy in the form of weekly carboplatin for an AUC of 2 and paclitaxel 45 mg/m given concurrent with radiation for 6-7 weeks. First cycle started 11/19/2014  CURRENT THERAPY:  Observation.  INTERVAL HISTORY: Susan Garrett 81 y.o. female returns to the clinic today for follow-up visit accompanied by her sister. The patient is feeling fine today with no specific complaints except for baseline mild shortness of breath. She is currently on home Oxygen. The patient denied having any significant weight loss or night sweats. She has no nausea or vomiting. She denied having any significant fever or chills. She had no chest pain and continues to have mild shortness breath with cough with no hemoptysis. She had repeat CT scan of the chest and she is here today for evaluation and discussion of her scan results.  MEDICAL HISTORY: Past Medical History  Diagnosis Date  . Hypertension   . Corns and callosities   . Callus   . Hyperthyroidism   . Encounter for antineoplastic chemotherapy 11/13/2014  . Encounter for antineoplastic chemotherapy 11/13/2014    ALLERGIES:  is allergic to fish allergy and shellfish allergy.  MEDICATIONS:  Current Outpatient Prescriptions  Medication Sig Dispense Refill  . albuterol (PROVENTIL) (2.5 MG/3ML) 0.083% nebulizer solution Take 3 mLs (2.5 mg total) by nebulization every 6 (six) hours as needed for wheezing or shortness of breath.  12  . ALPRAZolam (XANAX) 0.25 MG tablet TAKE 1 TABLET BY MOUTH AT BEDTIME AND 1 EVERY MORNING AS NEEDED FOR NERVES  0    . aspirin 81 MG tablet Take 81 mg by mouth daily.    . Glycopyrrolate-Formoterol (BEVESPI AEROSPHERE) 9-4.8 MCG/ACT AERO Inhale 2 puffs into the lungs 2 (two) times daily. 1 Inhaler 11  . methimazole (TAPAZOLE) 5 MG tablet Take 2.5 mg by mouth daily.     . metoprolol succinate (TOPROL-XL) 25 MG 24 hr tablet Take 25 mg by mouth daily.     . OXYGEN Inhale 2 L into the lungs continuous.    . polyethylene glycol (MIRALAX / GLYCOLAX) packet Take 17 g by mouth daily.    . sennosides-docusate sodium (SENOKOT-S) 8.6-50 MG tablet Take 1 tablet by mouth daily. Reported on 12/05/2015     No current facility-administered medications for this visit.    SURGICAL HISTORY:  Past Surgical History  Procedure Laterality Date  . Lesion removal Left 11/12/87  . Vaginal hysterectomy    . Video bronchoscopy Bilateral 10/18/2014    Procedure: VIDEO BRONCHOSCOPY WITH FLUORO;  Surgeon: Tanda Rockers, MD;  Location: [facility];  Service: Endoscopy;  Laterality: Bilateral;    REVIEW OF SYSTEMS:  A comprehensive review of systems was negative except for: Respiratory: positive for dyspnea on exertion   PHYSICAL EXAMINATION: General appearance: alert, cooperative, fatigued and no distress Head: Normocephalic, without obvious abnormality, atraumatic Neck: no adenopathy, no JVD, supple, symmetrical, trachea midline and thyroid not enlarged, symmetric, no tenderness/mass/nodules Lymph nodes: Cervical, supraclavicular, and axillary nodes normal. Resp: clear to auscultation bilaterally Back: symmetric, no curvature. ROM normal. No CVA tenderness. Cardio: regular rate and rhythm, S1, S2 normal, no murmur,  click, rub or gallop GI: soft, non-tender; bowel sounds normal; no masses,  no organomegaly Extremities: extremities normal, atraumatic, no cyanosis or edema Neurologic: Alert and oriented X 3, normal strength and tone. Normal symmetric reflexes. Normal coordination and gait  ECOG PERFORMANCE STATUS: 1 - Symptomatic  but completely ambulatory  Blood pressure 153/87, pulse 105, temperature 98.3 F (36.8 C), temperature source Oral, resp. rate 18, height '5\' 4"'$  (1.626 m), weight 139 lb 9.6 oz (63.322 kg), SpO2 98 %.  LABORATORY DATA: Lab Results  Component Value Date   WBC 7.2 11/18/2015   HGB 14.0 11/18/2015   HCT 45.2 11/18/2015   MCV 88.8 11/18/2015   PLT Clumped Platelets--Appears Adequate 11/18/2015      Chemistry      Component Value Date/Time   NA 142 11/18/2015 1118   NA 141 01/16/2015 0520   K 4.1 11/18/2015 1118   K 4.3 01/16/2015 0520   CL 104 01/16/2015 0520   CO2 33* 11/18/2015 1118   CO2 32 01/16/2015 0520   BUN 11.9 11/18/2015 1118   BUN 15 01/16/2015 0520   CREATININE 0.8 11/18/2015 1118   CREATININE 0.51 01/16/2015 0520      Component Value Date/Time   CALCIUM 9.9 11/18/2015 1118   CALCIUM 8.9 01/16/2015 0520   ALKPHOS 77 11/18/2015 1118   AST 26 11/18/2015 1118   ALT 22 11/18/2015 1118   BILITOT 0.80 11/18/2015 1118       RADIOGRAPHIC STUDIES: Ct Chest Wo Contrast  11/18/2015  CLINICAL DATA:  Lung cancer, status post chemotherapy EXAM: CT CHEST WITHOUT CONTRAST TECHNIQUE: Multidetector CT imaging of the chest was performed following the standard protocol without IV contrast. COMPARISON:  09/02/2015 FINDINGS: Mediastinum/Nodes: The heart is normal in size. No pericardial effusion. Mild atherosclerotic calcifications of the aortic arch. Small mediastinal lymph nodes measuring up to 5 mm short axis, within normal limits. Visualized thyroid is unremarkable. Lungs/Pleura: 10 x 15 mm right upper lobe pulmonary nodule (series 5/image 51), previously 11 x 16 mm. Adjacent 12 mm satellite nodule superiorly in the right upper lobe (series 5/ image 43), unchanged. 4 mm lateral right lower lobe nodule (series 5/ image 117), unchanged. 3 mm left lower lobe nodule (series 5/ image 85), unchanged. Moderate centrilobular and paraseptal emphysematous changes. No focal consolidation. Mild  elevation of the left hemidiaphragm. Right posterior Bochdalek's hernia. No pleural effusion or pneumothorax. Upper abdomen: Visualized upper abdomen is notable for a mildly nodular hepatic contour. Musculoskeletal: Degenerative changes of the visualized thoracolumbar spine. IMPRESSION: Two right upper lobe pulmonary nodules, measuring up to 15 mm, stable versus mildly decreased. Additional ancillary findings as above. Electronically Signed   By: Julian Hy M.D.   On: 11/18/2015 14:13    ASSESSMENT AND PLAN: This is a very pleasant 81 years old African-American female with unresectable a stage IIB non-small cell lung cancer who completed a course of concurrent chemoradiation with weekly carboplatin and paclitaxel with significant improvement in her disease. The patient is currently on observation and has been doing very well with no specific complaints except for her baseline shortness breath. Her recent CT scan of the chest showed no evidence for disease progression. I discussed the scan result with the patient and her sister. I would see her back for follow-up visit in 6 months with repeat CT scan of the chest. She was advised to call immediately if she has any concerning symptoms in the interval. The patient voices understanding of current disease status and treatment options and is in  agreement with the current care plan.  All questions were answered. The patient knows to call the clinic with any problems, questions or concerns. We can certainly see the patient much sooner if necessary.  Disclaimer: This note was dictated with voice recognition software. Similar sounding words can inadvertently be transcribed and may not be corrected upon review.

## 2015-12-05 NOTE — Telephone Encounter (Signed)
Pt shown how to use Spiriva Respimat. Nothing further needed.

## 2015-12-05 NOTE — Telephone Encounter (Signed)
PATIENT IN LOBBY to be shown how to use sample.

## 2016-02-25 ENCOUNTER — Ambulatory Visit (INDEPENDENT_AMBULATORY_CARE_PROVIDER_SITE_OTHER): Payer: Medicare Other | Admitting: Internal Medicine

## 2016-02-25 ENCOUNTER — Encounter: Payer: Self-pay | Admitting: Internal Medicine

## 2016-02-25 VITALS — BP 138/98 | HR 108 | Ht 64.0 in | Wt 143.2 lb

## 2016-02-25 DIAGNOSIS — J449 Chronic obstructive pulmonary disease, unspecified: Secondary | ICD-10-CM

## 2016-02-25 DIAGNOSIS — J9612 Chronic respiratory failure with hypercapnia: Secondary | ICD-10-CM

## 2016-02-25 DIAGNOSIS — J9611 Chronic respiratory failure with hypoxia: Secondary | ICD-10-CM

## 2016-02-25 DIAGNOSIS — I10 Essential (primary) hypertension: Secondary | ICD-10-CM | POA: Diagnosis not present

## 2016-02-25 MED ORDER — UMECLIDINIUM-VILANTEROL 62.5-25 MCG/INH IN AEPB: 1.0000 | INHALATION_SPRAY | Freq: Every day | RESPIRATORY_TRACT | 11 refills | Status: DC

## 2016-02-25 NOTE — Patient Instructions (Addendum)
Plan A = Automatic = Anoro one click each am  (instead of the bevespi 2 every 12 hours and see which you like better)   Work on inhaler technique:  relax and gently blow all the way out then take a nice smooth deep breath back in, triggering the inhaler at same time you start breathing in.  Hold for up to 5 seconds if you can. Blow out thru nose. Rinse and gargle with water when done   Plan B = Backup Only use your albuterol (proair) as a rescue medication to be used if you can't catch your breath by resting or doing a relaxed purse lip breathing pattern.  - The less you use it, the better it will work when you need it. - Ok to use the inhaler up to 2 puffs  every 4 hours if you must but call for appointment if use goes up over your usual need - Don't leave home without it !!  (think of it like the spare tire for your car)   Plan C = Crisis - only use your albuterol nebulizer if you first try Plan B and it fails to help > ok to use the nebulizer up to every 4 hours but if start needing it regularly call for immediate appointment   Plan D = Doctor - call me if B and C not adequate  Plan E = ER - go to ER or call 911 if all else fails   Please schedule a follow up office visit in 6 weeks, call sooner if needed to See Tammy NP with all meds in hand     .

## 2016-02-25 NOTE — Progress Notes (Signed)
Subjective:    Patient ID: Susan Garrett, female    DOB: [birth year]    MRN: [id number]    Brief patient profile:  68 yobf  Quit smoking completely 10/2014  with FTT x 2015 and new RUL mass on eval of 40 lb wt loss seen at request of Dr Carlis Abbott for lung mass / nl wt around 160   - rec FOB for 10/18/14 >no airway involvement > TBBx  RUL pos Sq cell ca  - PET 11/01/2014  The large spiculated right upper lobe mass is hypermetabolic consistent with primary bronchogenic carcinoma. There is indeterminate low level activity within the right hilum which could reflect nodal metastasis> referred to oncology   DIAGNOSIS: Primary cancer of right lung  Clinical stage from 11/01/2014: Stage IIB (T2b, N1, M0)  PRIOR THERAPY: Course of concurrent chemoradiation with chemotherapy in the form of weekly carboplatin for an AUC of 2 and paclitaxel 45 mg/m given concurrent with radiation for 6-7 weeks. First cycle started 11/19/2014  CURRENT THERAPY: Observation.    10/10/2015  f/u ov/Susan Garrett re:  COPD GOLD IV criteria/ maint rx = spiriva/symb160  Chief Complaint  Patient presents with  . Follow-up    Breathing is unchanged since her last ov. She reports that she did not qualify for POC and DME rec that she make another appt here.    Sleep ok AM cough/congestion x none Doe =  Steps/ does ok slow on 02 / struggles picking up 02  Plan A = Automatic = Symbicort 160 Take 2 puffs first thing in am and then another 2 puffs about 12 hours later.  Spiriva each am  Plan B = Backup - only use your albuterol nebulizer if you first try Plan B and it fails to help > ok to use the nebulizer up to every 4 hours but if start needing it regularly call for immediate appointment Please schedule a follow up office visit in 2 weeks, sooner if needed to see NP  - if better inhaler technique consider change to stiolto vs bevespi vs anoro depending on insurance/ preference for the different options    10/25/2015  f/u ov/Susan Garrett re:  COPD GOLD IV / maint symb 160/spiriva dpi 02 2lpm 24/7  Chief Complaint  Patient presents with  . Follow-up    Breathing is unchanged. No new co's today.   hoarseness worse in am  Doe = MMRC2 = can't walk a nl pace on a flat grade s sob but does fine slow and flat eg  grocery shopping but struggles with carrying portable 02 rec Plan A = Automatic = Bevespi Take 2 puffs first thing in am and then another 2 puffs about 12 hours later.  Work on inhaler technique:   Plan B = Backup Only use your albuterol neb as a rescue medication  Please see patient coordinator before you leave today  to schedule best fit for ?  POC     11/25/2015 Follow up : COPD GOLD IV, Lung cancer.  Patient returns for a one-month follow-up. She was recently changed from Symbicort and Spiriva to [place] .  Did not qualify for POC . Says she desated with walking on POC. Discussed pursed lip breathing exercises.  Remains on oxygen 2l/m . With regular tanks.  Her insurance required prior authorization for [place] . We are waiting for approval but if not  Formulary shows coverage to [facility] and [employer].  Patient has a history of right lung cancer stage IIB diagnosed in  May 2016 followed by Dr. Inda Merlin. She is status post chemotherapy and radiation Most recent CT chest 11/18/2015 showed stable pulmonary nodules in the right upper lobe measuring 15 mm Declines Pneumovax and Prevnar.  Good appetite , has regained back her weightl .  Rare albuterol use.    02/25/2016  f/u ov/Susan Garrett re:  Copd GOLD IV / lung Ca/ chronic resp failure  On 2lpm/ bevespi Chief Complaint  Patient presents with  . Follow-up    Pt c/o contiued ShOB with exertion. Pt is on 2L continuous. Pt c/o occasional morning cough with clear mucus. Pt denies wheeze/chest tightness/cp.   says can't walk 100 ft s stopping even on 0 2 - no longer doing any grocery shopping  Thoroughly confused with 02/ inhalers / details of care  No obvious day to day or daytime  variability or assoc excess/ purulent sputum or mucus plugs or hemoptysis or cp or chest tightness, subjective wheeze or overt sinus or hb symptoms. No unusual exp hx or h/o childhood pna/ asthma or knowledge of premature birth.  Sleeping ok without nocturnal  or early am exacerbation  of respiratory  c/o's or need for noct saba. Also denies any obvious fluctuation of symptoms with weather or environmental changes or other aggravating or alleviating factors except as outlined above   Current Medications, Allergies, Complete Past Medical History, Past Surgical History, Family History, and Social History were reviewed in [employer] record.  ROS  The following are not active complaints unless bolded sore throat, dysphagia, dental problems, itching, sneezing,  nasal congestion or excess/ purulent secretions, ear ache,   fever, chills, sweats, unintended wt loss, classically pleuritic or exertional cp,  orthopnea pnd or leg swelling, presyncope, palpitations, abdominal pain, anorexia, nausea, vomiting, diarrhea  or change in bowel or bladder habits, change in stools or urine, dysuria,hematuria,  rash, arthralgias, visual complaints, headache, numbness, weakness or ataxia or problems with walking or coordination,  change in mood/affect or memory.                         Objective:   Physical Exam   amb bf nad  Wt Readings from Last 3 Encounters:  02/25/16 143 lb 3.2 oz (65 kg)  12/05/15 139 lb 9.6 oz (63.3 kg)  11/25/15 139 lb (63 kg)    Vital signs reviewed  - note sats 97% on 2lpm/ BP elevated but not clear she took meds yet      HEENT: nl dentition, turbinates, and orophanx. Nl external ear canals without cough reflex   NECK :  without JVD/Nodes/TM/ nl carotid upstrokes bilaterally   LUNGS: no acc muscle use, clear to A and P bilaterally without cough on insp or exp maneuvers   CV:  RRR  no s3 or murmur or increase in P2, no edema   ABD:  soft and  nontender with nl excursion in the supine position. No bruits or organomegaly, bowel sounds nl  MS:  warm without deformities, calf tenderness, cyanosis or clubbing  SKIN: warm and dry without lesions    NEURO:  alert, approp, no deficits

## 2016-02-27 ENCOUNTER — Encounter: Payer: Self-pay | Admitting: Internal Medicine

## 2016-02-27 NOTE — Assessment & Plan Note (Signed)
Spirometry 10/12/14  fev1  0.5 (27%) ratio 39  10/25/2015  > try BEVESPI   She strikes me as more debilitated and weak than limited by sob but needs a lot of help with meds  - The proper method of use, as well as anticipated side effects, of a metered-dose inhaler are discussed and demonstrated to the patient. Improved effectiveness after extensive coaching during this visit to a level of approximately 50 % from a baseline of 25 %  > doubt she's really getting much bevespi in > try anoro   I had an extended discussion with the patient reviewing all relevant studies completed to date and  lasting 15 to 20 minutes of a 25 minute visit    Each maintenance medication was reviewed in detail including most importantly the difference between maintenance and prns and under what circumstances the prns are to be triggered using an action plan format that is not reflected in the computer generated alphabetically organized AVS.    Please see instructions for details which were reviewed in writing and the patient given a copy highlighting the part that I personally wrote and discussed at today's ov.

## 2016-02-27 NOTE — Assessment & Plan Note (Addendum)
11/05/15 trial of POC > deasat to 83% on 5lpm pulsed so set up on portable tank - HCO3   11/18/15  = 33   - 02/25/2016   Walked RA x one lap @ 185 stopped due to  Tired, nl pace, no desats on 2lpm    Adequate control on present rx, reviewed > no change in rx needed

## 2016-02-27 NOTE — Assessment & Plan Note (Signed)
Note bp elevated but not clear she took meds yet > Follow up per Primary Care planned

## 2016-04-06 ENCOUNTER — Other Ambulatory Visit: Payer: Self-pay | Admitting: Internal Medicine

## 2016-04-06 DIAGNOSIS — C3411 Malignant neoplasm of upper lobe, right bronchus or lung: Secondary | ICD-10-CM

## 2016-04-07 ENCOUNTER — Encounter: Payer: Self-pay | Admitting: Adult Health

## 2016-04-07 ENCOUNTER — Ambulatory Visit (INDEPENDENT_AMBULATORY_CARE_PROVIDER_SITE_OTHER): Payer: Medicare Other | Admitting: Adult Health

## 2016-04-07 DIAGNOSIS — J9611 Chronic respiratory failure with hypoxia: Secondary | ICD-10-CM

## 2016-04-07 DIAGNOSIS — J9612 Chronic respiratory failure with hypercapnia: Secondary | ICD-10-CM

## 2016-04-07 NOTE — Progress Notes (Signed)
Subjective:    Patient ID: Susan Garrett, female    DOB: 06-Apr-1935    MRN: [id number]    Brief patient profile:  59 yobf  Quit smoking completely 10/2014  with FTT x 2015 and new RUL mass on eval of 40 lb wt loss seen at request of Dr Carlis Abbott for lung mass / nl wt around 160   - rec FOB for 10/18/14 >no airway involvement > TBBx  RUL pos Sq cell ca  - PET 11/01/2014  The large spiculated right upper lobe mass is hypermetabolic consistent with primary bronchogenic carcinoma. There is indeterminate low level activity within the right hilum which could reflect nodal metastasis> referred to oncology   DIAGNOSIS: Primary cancer of right lung  Clinical stage from 11/01/2014: Stage IIB (T2b, N1, M0)  PRIOR THERAPY: Course of concurrent chemoradiation with chemotherapy in the form of weekly carboplatin for an AUC of 2 and paclitaxel 45 mg/m given concurrent with radiation for 6-7 weeks. First cycle started 11/19/2014  CURRENT THERAPY: Observation.    10/10/2015  f/u ov/Wert re:  COPD GOLD IV criteria/ maint rx = spiriva/symb160  Chief Complaint  Patient presents with  . Follow-up    Breathing is unchanged since her last ov. She reports that she did not qualify for POC and DME rec that she make another appt here.    Sleep ok AM cough/congestion x none Doe =  Steps/ does ok slow on 02 / struggles picking up 02  Plan A = Automatic = Symbicort 160 Take 2 puffs first thing in am and then another 2 puffs about 12 hours later.  Spiriva each am  Plan B = Backup - only use your albuterol nebulizer if you first try Plan B and it fails to help > ok to use the nebulizer up to every 4 hours but if start needing it regularly call for immediate appointment Please schedule a follow up office visit in 2 weeks, sooner if needed to see NP  - if better inhaler technique consider change to stiolto vs bevespi vs anoro depending on insurance/ preference for the different options    10/25/2015  f/u ov/Wert re:  COPD GOLD IV / maint symb 160/spiriva dpi 02 2lpm 24/7  Chief Complaint  Patient presents with  . Follow-up    Breathing is unchanged. No new co's today.   hoarseness worse in am  Doe = MMRC2 = can't walk a nl pace on a flat grade s sob but does fine slow and flat eg  grocery shopping but struggles with carrying portable 02 rec Plan A = Automatic = Bevespi Take 2 puffs first thing in am and then another 2 puffs about 12 hours later.  Work on inhaler technique:   Plan B = Backup Only use your albuterol neb as a rescue medication  Please see patient coordinator before you leave today  to schedule best fit for ?  POC     11/25/2015 Follow up : COPD GOLD IV, Lung cancer.  Patient returns for a one-month follow-up. She was recently changed from Symbicort and Spiriva to [place] .  Did not qualify for POC . Says she desated with walking on POC. Discussed pursed lip breathing exercises.  Remains on oxygen 2l/m . With regular tanks.  Her insurance required prior authorization for [place] . We are waiting for approval but if not  Formulary shows coverage to [facility] and [employer].  Patient has a history of right lung cancer stage IIB diagnosed in  May 2016 followed by Dr. Inda Merlin. She is status post chemotherapy and radiation Most recent CT chest 11/18/2015 showed stable pulmonary nodules in the right upper lobe measuring 15 mm Declines Pneumovax and Prevnar.  Good appetite , has regained back her weightl .  Rare albuterol use.    02/25/2016  f/u ov/Wert re:  Copd GOLD IV / lung Ca/ chronic resp failure  On 2lpm/ bevespi Chief Complaint  Patient presents with  . Follow-up    Pt c/o contiued ShOB with exertion. Pt is on 2L continuous. Pt c/o occasional morning cough with clear mucus. Pt denies wheeze/chest tightness/cp.   says can't walk 100 ft s stopping even on 0 2 - no longer doing any grocery shopping  Thoroughly confused with 02/ inhalers / details of care >>trial of anoro    04/07/2016  Follow up : COPD GOLD IV/Lung Cancer /Chronic Resp Failure on o2 Patient presents for a 6 week follow-up. Last visit. Patient was tried on [facility] in place of [place] however, felt this did not work as good and has recently went back to [employer].  She says that she is doing okay without flare of cough or wheezing .  Continues to be weak with low energy.  She is on O2 at 2l/m . Wants to have a portable concentrator ,previously desated on pulse flow She would like to try again to see if this will work now.  She has upcoming follow up with Dr, Julien Nordmann in Dec and serial CT scan .  She denies any chest pain, orthopnea, PND, or increased leg swelling    Current Medications, Allergies, Complete Past Medical History, Past Surgical History, Family History, and Social History were reviewed in [employer] record.  ROS  The following are not active complaints unless bolded sore throat, dysphagia, dental problems, itching, sneezing,  nasal congestion or excess/ purulent secretions, ear ache,   fever, chills, sweats, unintended wt loss, classically pleuritic or exertional cp,  orthopnea pnd or leg swelling, presyncope, palpitations, abdominal pain, anorexia, nausea, vomiting, diarrhea  or change in bowel or bladder habits, change in stools or urine, dysuria,hematuria,  rash, arthralgias, visual complaints, headache, numbness, weakness or ataxia or problems with walking or coordination,  change in mood/affect or memory.                         Objective:   Physical Exam   amb bf nad Vitals:   04/07/16 1157  BP: 132/80  Temp: 97.6 F (36.4 C)  TempSrc: Oral  SpO2: 99%  Weight: 142 lb 9.6 oz (64.7 kg)  Height: '5\' 4"'$  (1.626 m)      Vital signs reviewed  -     HEENT: nl dentition, turbinates, and orophanx. Nl external ear canals without cough reflex   NECK :  without JVD/Nodes/TM/ nl carotid upstrokes bilaterally   LUNGS: no acc muscle use, clear to A and P  bilaterally without cough on insp or exp maneuvers   CV:  RRR  no s3 or murmur or increase in P2, no edema   ABD:  soft and nontender with nl excursion in the supine position. No bruits or organomegaly, bowel sounds nl  MS:  warm without deformities, calf tenderness, cyanosis or clubbing  SKIN: warm and dry without lesions    NEURO:  alert, approp, no deficits    Iyahna Obriant NP-C  [facility]  04/07/2016.

## 2016-04-07 NOTE — Addendum Note (Signed)
Addended by: Doroteo Glassman D on: 04/07/2016 03:53 PM   Modules accepted: Orders

## 2016-04-07 NOTE — Patient Instructions (Addendum)
Continue on Bevespi 2 puffs Twice daily  , rinse after use.  Continue on 2l/m Oxygen -check on portable oxygen concentrator.  Follow up with Dr. Julien Nordmann as planned  follow up Dr. Melvyn Novas  In 3-4 months and As needed

## 2016-04-07 NOTE — Assessment & Plan Note (Signed)
Compensated on o2  Check to see if qualifies for POC  Plan  Patient Instructions  Continue on Bevespi 2 puffs Twice daily  , rinse after use.  Continue on 2l/m Oxygen -check on portable oxygen concentrator.  Follow up with Dr. Julien Nordmann as planned  follow up Dr. Melvyn Novas  In 3-4 months and As needed

## 2016-04-08 NOTE — Progress Notes (Signed)
Chart and office note reviewed in detail  > agree with a/p as outlined    

## 2016-04-15 ENCOUNTER — Telehealth: Payer: Self-pay | Admitting: Internal Medicine

## 2016-04-15 NOTE — Telephone Encounter (Signed)
Spoke with Corene Cornea with [facility] who confirmed that POC order has been received and sent over to [facility] on 04-08-16. Corene Cornea states he will send a f/u email to [facility] to get pt scheduled. Pt is made aware of this & advised to give [place] a call back if she hasn't heard from [facility] by the end of the week. Pt voiced understanding & had no further questions. Nothing further needed.

## 2016-05-19 ENCOUNTER — Other Ambulatory Visit (HOSPITAL_BASED_OUTPATIENT_CLINIC_OR_DEPARTMENT_OTHER): Payer: Medicare Other

## 2016-05-19 ENCOUNTER — Ambulatory Visit (HOSPITAL_COMMUNITY)
Admission: RE | Admit: 2016-05-19 | Discharge: 2016-05-19 | Disposition: A | Discharge status: Home / Self Care | Payer: Medicare Other | Source: Ambulatory Visit | Attending: Internal Medicine | Admitting: Internal Medicine

## 2016-05-19 DIAGNOSIS — C3491 Malignant neoplasm of unspecified part of right bronchus or lung: Secondary | ICD-10-CM

## 2016-05-19 DIAGNOSIS — I2510 Atherosclerotic heart disease of native coronary artery without angina pectoris: Secondary | ICD-10-CM | POA: Diagnosis not present

## 2016-05-19 DIAGNOSIS — C3411 Malignant neoplasm of upper lobe, right bronchus or lung: Secondary | ICD-10-CM

## 2016-05-19 DIAGNOSIS — J439 Emphysema, unspecified: Secondary | ICD-10-CM | POA: Insufficient documentation

## 2016-05-19 DIAGNOSIS — M47814 Spondylosis without myelopathy or radiculopathy, thoracic region: Secondary | ICD-10-CM | POA: Insufficient documentation

## 2016-05-19 LAB — COMPREHENSIVE METABOLIC PANEL
ALBUMIN: 3.7 g/dL (ref 3.5–5.0)
ALK PHOS: 107 U/L (ref 40–150)
ALT: 21 U/L (ref 0–55)
ANION GAP: 9 meq/L (ref 3–11)
AST: 28 U/L (ref 5–34)
BILIRUBIN TOTAL: 1.19 mg/dL (ref 0.20–1.20)
BUN: 11.8 mg/dL (ref 7.0–26.0)
CALCIUM: 10.4 mg/dL (ref 8.4–10.4)
CO2: 34 mEq/L — ABNORMAL HIGH (ref 22–29)
Chloride: 101 mEq/L (ref 98–109)
Creatinine: 0.8 mg/dL (ref 0.6–1.1)
EGFR: 83 mL/min/{1.73_m2} — AB (ref >90)
GLUCOSE: 101 mg/dL (ref 70–140)
POTASSIUM: 4.2 meq/L (ref 3.5–5.1)
Sodium: 144 mEq/L (ref 136–145)
TOTAL PROTEIN: 7.7 g/dL (ref 6.4–8.3)

## 2016-05-19 LAB — CBC WITH DIFFERENTIAL/PLATELET
BASO%: 0.8 % (ref 0.0–2.0)
Basophils Absolute: 0.1 10*3/uL (ref 0.0–0.1)
EOS%: 1.5 % (ref 0.0–7.0)
Eosinophils Absolute: 0.1 10*3/uL (ref 0.0–0.5)
HEMATOCRIT: 46.1 % (ref 34.8–46.6)
HGB: 14 g/dL (ref 11.6–15.9)
LYMPH#: 1.9 10*3/uL (ref 0.9–3.3)
LYMPH%: 24 % (ref 14.0–49.7)
MCH: 25.8 pg (ref 25.1–34.0)
MCHC: 30.4 g/dL — AB (ref 31.5–36.0)
MCV: 84.9 fL (ref 79.5–101.0)
MONO#: 0.6 10*3/uL (ref 0.1–0.9)
MONO%: 8 % (ref 0.0–14.0)
NEUT%: 65.7 % (ref 38.4–76.8)
NEUTROS ABS: 5.3 10*3/uL (ref 1.5–6.5)
Platelets: 223 10*3/uL (ref 145–400)
RBC: 5.43 10*6/uL (ref 3.70–5.45)
RDW: 14.6 % — AB (ref 11.2–14.5)
WBC: 8 10*3/uL (ref 3.9–10.3)

## 2016-05-26 ENCOUNTER — Encounter: Payer: Self-pay | Admitting: Internal Medicine

## 2016-05-26 ENCOUNTER — Telehealth: Payer: Self-pay | Admitting: Internal Medicine

## 2016-05-26 ENCOUNTER — Ambulatory Visit (HOSPITAL_BASED_OUTPATIENT_CLINIC_OR_DEPARTMENT_OTHER): Payer: Medicare Other | Admitting: Internal Medicine

## 2016-05-26 ENCOUNTER — Other Ambulatory Visit: Payer: Self-pay | Admitting: Medical Oncology

## 2016-05-26 VITALS — BP 159/91 | HR 101 | Temp 98.5°F | Resp 18 | Ht 64.0 in | Wt 142.4 lb

## 2016-05-26 DIAGNOSIS — C3411 Malignant neoplasm of upper lobe, right bronchus or lung: Secondary | ICD-10-CM

## 2016-05-26 DIAGNOSIS — C3491 Malignant neoplasm of unspecified part of right bronchus or lung: Secondary | ICD-10-CM

## 2016-05-26 DIAGNOSIS — I10 Essential (primary) hypertension: Secondary | ICD-10-CM | POA: Diagnosis not present

## 2016-05-26 NOTE — Progress Notes (Signed)
[facility] [phone]   [phone]  OFFICE PROGRESS NOTE  [name], MD [address] [place] [address]  DIAGNOSIS: Primary cancer of right lung  Staging form: Lung, AJCC 7th Edition  Clinical stage from 11/01/2014: Stage IIB (T2b, N1, M0) - Signed by Curt Bears, MD on 11/03/2014  PRIOR THERAPY: Course of concurrent chemoradiation with chemotherapy in the form of weekly carboplatin for an AUC of 2 and paclitaxel 45 mg/m given concurrent with radiation for 6-7 weeks. First cycle started 11/19/2014  CURRENT THERAPY:  Observation.  INTERVAL HISTORY: Susan Garrett 81 y.o. female came to the clinic today for six-month follow-up visit accompanied by a family member. The patient is feeling fine today with no specific complaints except for baseline shortness of breath and she is currently on home oxygen. The patient has no significant weight loss or night sweats. She has no fever or chills. The patient denied having any chest pain, cough or hemoptysis. She has no nausea or vomiting. She had repeat CT scan of the chest performed recently and she is here for evaluation and discussion of her scan results.  MEDICAL HISTORY: Past Medical History:  Diagnosis Date  . Callus   . Corns and callosities   . Encounter for antineoplastic chemotherapy 11/13/2014  . Encounter for antineoplastic chemotherapy 11/13/2014  . Hypertension   . Hyperthyroidism     ALLERGIES:  is allergic to fish allergy and shellfish allergy.  MEDICATIONS:  Current Outpatient Prescriptions  Medication Sig Dispense Refill  . albuterol (PROVENTIL) (2.5 MG/3ML) 0.083% nebulizer solution Take 3 mLs (2.5 mg total) by nebulization every 6 (six) hours as needed for wheezing or shortness of breath.  12  . ALPRAZolam (XANAX) 0.25 MG tablet TAKE 1 TABLET BY MOUTH AT BEDTIME AND 1 EVERY MORNING AS NEEDED FOR NERVES  0  . aspirin 81 MG tablet Take 81 mg by mouth  daily.    . Glycopyrrolate-Formoterol (BEVESPI AEROSPHERE) 9-4.8 MCG/ACT AERO Inhale 2 puffs into the lungs 2 (two) times daily. 1 Inhaler 11  . methimazole (TAPAZOLE) 5 MG tablet Take 2.5 mg by mouth daily.     . metoprolol succinate (TOPROL-XL) 25 MG 24 hr tablet Take 25 mg by mouth daily.     . OXYGEN Inhale 2 L into the lungs continuous.    . polyethylene glycol (MIRALAX / GLYCOLAX) packet Take 17 g by mouth daily.    [name] PROAIR HFA 108 (90 Base) MCG/ACT inhaler Inhale 2 puffs into the lungs every 6 (six) hours as needed.    . sennosides-docusate sodium (SENOKOT-S) 8.6-50 MG tablet Take 1 tablet by mouth daily. Reported on 12/05/2015    . umeclidinium-vilanterol (ANORO ELLIPTA) 62.5-25 MCG/INH AEPB Inhale 1 puff into the lungs daily. (Patient not taking: Reported on 04/07/2016) 60 each 11   No current facility-administered medications for this visit.     SURGICAL HISTORY:  Past Surgical History:  Procedure Laterality Date  . LESION REMOVAL Left 11/12/87  . VAGINAL HYSTERECTOMY    . VIDEO BRONCHOSCOPY Bilateral 10/18/2014   Procedure: VIDEO BRONCHOSCOPY WITH FLUORO;  Surgeon: Tanda Rockers, MD;  Location: [facility] END[facility];  Service: Endoscopy;  Laterality: Bilateral;    REVIEW OF SYSTEMS:  A comprehensive review of systems was negative except for: Respiratory: positive for dyspnea on exertion   PHYSICAL EXAMINATION: General appearance: alert, cooperative and no distress Head: Normocephalic, without obvious abnormality, atraumatic Neck: no adenopathy, no JVD, supple, symmetrical, trachea midline and  thyroid not enlarged, symmetric, no tenderness/mass/nodules Lymph nodes: Cervical, supraclavicular, and axillary nodes normal. Resp: clear to auscultation bilaterally Back: symmetric, no curvature. ROM normal. No CVA tenderness. Cardio: regular rate and rhythm, S1, S2 normal, no murmur, click, rub or gallop GI: soft, non-tender; bowel sounds normal; no masses,  no organomegaly Extremities:  extremities normal, atraumatic, no cyanosis or edema  ECOG PERFORMANCE STATUS: 1 - Symptomatic but completely ambulatory  Blood pressure (!) 159/91, pulse (!) 101, temperature 98.5 F (36.9 C), temperature source Oral, resp. rate 18, height '5\' 4"'$  (1.626 m), weight 142 lb 6.4 oz (64.6 kg), SpO2 98 %.  LABORATORY DATA: Lab Results  Component Value Date   WBC 8.0 05/19/2016   HGB 14.0 05/19/2016   HCT 46.1 05/19/2016   MCV 84.9 05/19/2016   PLT 223 05/19/2016      Chemistry      Component Value Date/Time   NA 144 05/19/2016 1101   K 4.2 05/19/2016 1101   CL 104 01/16/2015 0520   CO2 34 (H) 05/19/2016 1101   BUN 11.8 05/19/2016 1101   CREATININE 0.8 05/19/2016 1101      Component Value Date/Time   CALCIUM 10.4 05/19/2016 1101   ALKPHOS 107 05/19/2016 1101   AST 28 05/19/2016 1101   ALT 21 05/19/2016 1101   BILITOT 1.19 05/19/2016 1101       RADIOGRAPHIC STUDIES: Ct Chest Wo Contrast  Result Date: 05/19/2016 CLINICAL DATA:  Right upper lobe lung cancer restaging, chemotherapy complete. EXAM: CT CHEST WITHOUT CONTRAST TECHNIQUE: Multidetector CT imaging of the chest was performed following the standard protocol without IV contrast. COMPARISON:  11/18/2015 FINDINGS: Cardiovascular: Coronary, aortic arch, and branch vessel atherosclerotic vascular disease. Mediastinum/Nodes: Mildly dilated gas-filled mid esophagus. No pathologic thoracic adenopathy identified. Lungs/Pleura: Severe emphysema. Volume loss/scarring inferiorly in the right upper lobe, likely with a component of radiation fibrosis or pneumonitis. There is some confluent volume loss with confluent vessels within this region which has a somewhat protein appearance but with the confluent vessels and density measuring approximately 9 mm in thickness today and approximately 10 mm in thickness on 11/18/2015. Eventration of the right posterior hemidiaphragm. Densely calcified 0.5 by 0.3 cm left lower lobe nodule, image 87/5,  stable if. 4 by 3 mm right lower lobe nodule on image 126/5, no change from earliest available comparison of 10/11/2014. Upper Abdomen: Chronic fullness of the left adrenal gland. Musculoskeletal: Thoracic spondylosis. IMPRESSION: 1. No recurrent mass identified. There is scarring and volume loss in the right upper lobe with a slightly nodular component not appreciably changed from prior. 2. 4 by 3 mm right lower lobe pulmonary nodule no change from earliest available comparison of 10/11/2014. 3. Small calcified granuloma in the left lower lobe. 4. Severe emphysema. 5. Thoracic spondylosis. Electronically Signed   By: Van Clines M.D.   On: 05/19/2016 14:36    ASSESSMENT AND PLAN: This is a very pleasant 81 years old African-American female with history of unresectable stage IIb non-small cell lung cancer.  She underwent a course of concurrent chemoradiation with weekly carboplatin and paclitaxel with partial response.  She is currently on observation and feeling fine except for the baseline shortness of breath secondary to her COPD and she is on home oxygen. The recent CT scan of the chest showed no clear evidence for disease progression. I discussed the scan results with the patient and her family member. I recommended for her to continue on observation with repeat CT scan of the chest in 6 months.  For hypertension, I advised the patient to take her blood pressure medication as ordered by her primary care physician and to reconsult with him for dose adjustment if needed. She was advised to call immediately if she has any concerning symptoms in the interval. The patient voices understanding of current disease status and treatment options and is in agreement with the current care plan.  All questions were answered. The patient knows to call the clinic with any problems, questions or concerns. We can certainly see the patient much sooner if necessary. I spent 10 minutes counseling the patient  face to face. The total time spent in the appointment was 15 minutes. Disclaimer: This note was dictated with voice recognition software. Similar sounding words can inadvertently be transcribed and may not be corrected upon review.

## 2016-05-26 NOTE — Telephone Encounter (Signed)
Gave patient avs report and appointments for June. Central radiology to call re scan.   Per patient her ct scan should be w/o contrast - per patient she is allergic. Left message informing desk nurse.

## 2016-06-05 ENCOUNTER — Other Ambulatory Visit: Payer: Self-pay | Admitting: Nurse Practitioner

## 2016-08-11 ENCOUNTER — Ambulatory Visit (INDEPENDENT_AMBULATORY_CARE_PROVIDER_SITE_OTHER): Payer: Medicare Other | Admitting: Internal Medicine

## 2016-08-11 ENCOUNTER — Encounter: Payer: Self-pay | Admitting: Internal Medicine

## 2016-08-11 VITALS — BP 146/82 | HR 108 | Ht 64.0 in | Wt 137.8 lb

## 2016-08-11 DIAGNOSIS — J449 Chronic obstructive pulmonary disease, unspecified: Secondary | ICD-10-CM

## 2016-08-11 DIAGNOSIS — J9612 Chronic respiratory failure with hypercapnia: Secondary | ICD-10-CM

## 2016-08-11 DIAGNOSIS — J9611 Chronic respiratory failure with hypoxia: Secondary | ICD-10-CM | POA: Diagnosis not present

## 2016-08-11 MED ORDER — GLYCOPYRROLATE-FORMOTEROL 9-4.8 MCG/ACT IN AERO: 2.0000 | INHALATION_SPRAY | Freq: Two times a day (BID) | RESPIRATORY_TRACT | 11 refills | Status: AC

## 2016-08-11 NOTE — Progress Notes (Signed)
Subjective:    Patient ID: Susan Garrett, female    DOB: Oct 11, 1934    MRN: [id number]     Brief patient profile:  7 yobf  Quit smoking completely 10/2014  with FTT x 2015 and new RUL mass on eval of 40 lb wt loss seen at request of Dr Carlis Abbott for lung mass / nl wt around 160   - rec FOB for 10/18/14 >no airway involvement > TBBx  RUL pos Sq cell ca  - PET 11/01/2014  The large spiculated right upper lobe mass is hypermetabolic consistent with primary bronchogenic carcinoma. There is indeterminate low level activity within the right hilum which could reflect nodal metastasis> referred to oncology   DIAGNOSIS: Primary cancer of right lung  Clinical stage from 11/01/2014: Stage IIB (T2b, N1, M0)  PRIOR THERAPY: Course of concurrent chemoradiation with chemotherapy in the form of weekly carboplatin for an AUC of 2 and paclitaxel 45 mg/m given concurrent with radiation for 6-7 weeks. First cycle started 11/19/2014  CURRENT THERAPY: Observation.    10/10/2015  f/u ov/Wert re:  COPD GOLD IV criteria/ maint rx = spiriva/symb160  Chief Complaint  Patient presents with  . Follow-up    Breathing is unchanged since her last ov. She reports that she did not qualify for POC and DME rec that she make another appt here.    Sleep ok AM cough/congestion x none Doe =  Steps/ does ok slow on 02 / struggles picking up 02  Plan A = Automatic = Symbicort 160 Take 2 puffs first thing in am and then another 2 puffs about 12 hours later.  Spiriva each am  Plan B = Backup - only use your albuterol nebulizer if you first try Plan B and it fails to help > ok to use the nebulizer up to every 4 hours but if start needing it regularly call for immediate appointment Please schedule a follow up office visit in 2 weeks, sooner if needed to see NP  - if better inhaler technique consider change to stiolto vs bevespi vs anoro depending on insurance/ preference for the different options    10/25/2015  f/u ov/Wert  re: COPD GOLD IV / maint symb 160/spiriva dpi 02 2lpm 24/7  Chief Complaint  Patient presents with  . Follow-up    Breathing is unchanged. No new co's today.   hoarseness worse in am  Doe = MMRC2 = can't walk a nl pace on a flat grade s sob but does fine slow and flat eg  grocery shopping but struggles with carrying portable 02 rec Plan A = Automatic = Bevespi Take 2 puffs first thing in am and then another 2 puffs about 12 hours later.  Work on inhaler technique:   Plan B = Backup Only use your albuterol neb as a rescue medication  Please see patient coordinator before you leave today  to schedule best fit for ?  POC     11/25/2015 Follow up : COPD GOLD IV, Lung cancer.  Patient returns for a one-month follow-up. She was recently changed from Symbicort and Spiriva to [place] .  Did not qualify for POC . Says she desated with walking on POC. Discussed pursed lip breathing exercises.  Remains on oxygen 2l/m . With regular tanks.  Her insurance required prior authorization for [place] . We are waiting for approval but if not  Formulary shows coverage to [facility] and [employer].  Patient has a history of right lung cancer stage IIB diagnosed  in May 2016 followed by Dr. Inda Merlin. She is status post chemotherapy and radiation Most recent CT chest 11/18/2015 showed stable pulmonary nodules in the right upper lobe measuring 15 mm Declines Pneumovax and Prevnar.  Good appetite , has regained back her weightl .  Rare albuterol use.    02/25/2016  f/u ov/Wert re:  Copd GOLD IV / lung Ca/ chronic resp failure  On 2lpm/ bevespi Chief Complaint  Patient presents with  . Follow-up    Pt c/o contiued ShOB with exertion. Pt is on 2L continuous. Pt c/o occasional morning cough with clear mucus. Pt denies wheeze/chest tightness/cp.   says can't walk 100 ft s stopping even on 0 2 - no longer doing any grocery shopping  Thoroughly confused with 02/ inhalers / details of care >>trial of anoro     04/07/2016 M{ Follow up : COPD GOLD IV/Lung Cancer /Chronic Resp Failure on o2 Patient presents for a 6 week follow-up. Last visit. Patient was tried on [facility] in place of [place] however, felt this did not work as well and   recently went back to [employer].  She says that she is doing okay without flare of cough or wheezing .  Continues to be weak with low energy.  She is on O2 at 2l/m . Wants to have a portable concentrator ,previously desated on pulse flow She would like to try again to see if this will work now.  She has upcoming follow up with Dr, Julien Nordmann in Dec and serial CT scan rec Continue on Bevespi 2 puffs Twice daily  , rinse after use.  Continue on 2l/m Oxygen -check on portable oxygen concentrator.  Follow up with Dr. Julien Nordmann as planned     08/11/2016  f/u ov/Wert re:  GOLD IV COPD/ 02 dep on bevespi / has proair no neb medication but doesn't feel she needs either   Doe = MMRC3 = can't walk 100 yards even at a slow pace at a flat grade s stopping due to sob  On 2lpm   No obvious day to day or daytime variability or assoc excess/ purulent sputum or mucus plugs or hemoptysis or cp or chest tightness, subjective wheeze or overt sinus or hb symptoms. No unusual exp hx or h/o childhood pna/ asthma or knowledge of premature birth.  Sleeping ok without nocturnal  or early am exacerbation  of respiratory  c/o's or need for noct saba. Also denies any obvious fluctuation of symptoms with weather or environmental changes or other aggravating or alleviating factors except as outlined above   Current Medications, Allergies, Complete Past Medical History, Past Surgical History, Family History, and Social History were reviewed in [employer] record.  ROS  The following are not active complaints unless bolded sore throat, dysphagia, dental problems, itching, sneezing,  nasal congestion or excess/ purulent secretions, ear ache,   fever, chills, sweats, unintended wt loss,  classically pleuritic or exertional cp,  orthopnea pnd or leg swelling, presyncope, palpitations, abdominal pain, anorexia, nausea, vomiting, diarrhea  or change in bowel or bladder habits, change in stools or urine, dysuria,hematuria,  rash, arthralgias, visual complaints, headache, numbness, weakness or ataxia or problems with walking or coordination,  change in mood/affect or memory.                 Objective:   Physical Exam   amb bf nad    Wt Readings from Last 3 Encounters:  08/11/16 137 lb 12.8 oz (62.5 kg)  05/26/16 142 lb  6.4 oz (64.6 kg)  04/07/16 142 lb 9.6 oz (64.7 kg)    Vital signs reviewed  - Note on arrival 02 sats  97% on 2lpm       HEENT: nl dentition, turbinates, and orophanx. Nl external ear canals without cough reflex   NECK :  without JVD/Nodes/TM/ nl carotid upstrokes bilaterally   LUNGS: no acc muscle use, barrel shaped chest with distant bs bilaterally but no wheeze/rhonchi   CV:  RRR  no s3 or murmur or increase in P2, no edema   ABD:  soft and nontender with nl excursion in the supine position. No bruits or organomegaly, bowel sounds nl  MS:  warm without deformities, calf tenderness, cyanosis or clubbing  SKIN: warm and dry without lesions    NEURO:  alert, approp, no deficits

## 2016-08-11 NOTE — Patient Instructions (Addendum)
Plan A = Automatic = Bevespi Take 2 puffs first thing in am and then another 2 puffs about 12 hours later.    Work on inhaler technique:  relax and gently blow all the way out then take a nice smooth deep breath back in, triggering the inhaler at same time you start breathing in.  Hold for up to 5 seconds if you can. Blow out thru nose. Rinse and gargle with water when done     Plan B = Backup Only use your albuterol (PROAIR) as a rescue medication to be used if you can't catch your breath by resting or doing a relaxed purse lip breathing pattern.  - The less you use it, the better it will work when you need it. - Ok to use the inhaler up to 2 puffs  every 4 hours if you must but call for appointment if use goes up over your usual need - Don't leave home without it !!  (think of it like the spare tire for your car)    Please schedule a follow up visit in 3 months but call sooner if needed  with all medications /inhalers/ solutions in hand so we can verify exactly what you are taking. This includes all medications from all doctors and over the counters - see Tammy NP on return

## 2016-08-12 NOTE — Assessment & Plan Note (Signed)
11/05/15 trial of POC > deasat to 83% on 5lpm pulsed so set up on portable tank - HCO3   11/18/15  = 33  - 02/25/2016   Walked RA x one lap @ 185 stopped due to  Tired, nl pace, no desats on 2lpm     Rec as of 08/11/2016 = 2lpm 24/7

## 2016-08-12 NOTE — Assessment & Plan Note (Signed)
Spirometry 10/12/14  fev1  0.5 (27%) ratio 39  10/25/2015    try BEVESPI  - 02/25/2016 poor hfa > try anoro   - 08/11/2016  After extensive coaching HFA effectiveness =    75% from baseline < 50%  Clearly Pt is GOLD group B in terms of symptom/risk and laba/lama therefore appropriate rx at this point but I'm concerned about her ability to use HFA correctly and spent extra time with her today making she she knows how to use it and showed her how she can practice using an empty cannister of Proair  I had an extended discussion with the patient reviewing all relevant studies completed to date and  lasting 15 to 20 minutes of a 25 minute visit    Each maintenance medication was reviewed in detail including most importantly the difference between maintenance and prns and under what circumstances the prns are to be triggered using an action plan format that is not reflected in the computer generated alphabetically organized AVS.    Please see AVS for specific instructions unique to this visit that I personally wrote and verbalized to the the pt in detail and then reviewed with pt  by my nurse highlighting any  changes in therapy recommended at today's visit to their plan of care.

## 2016-11-03 ENCOUNTER — Inpatient Hospital Stay (HOSPITAL_COMMUNITY)
Admission: EM | Admit: 2016-11-03 | Discharge: 2016-12-06 | DRG: 208 | Disposition: E | Payer: Medicare Other | Attending: Pulmonary Disease | Admitting: Pulmonary Disease

## 2016-11-03 ENCOUNTER — Inpatient Hospital Stay (HOSPITAL_COMMUNITY): Payer: Medicare Other

## 2016-11-03 ENCOUNTER — Emergency Department (HOSPITAL_COMMUNITY): Payer: Medicare Other

## 2016-11-03 DIAGNOSIS — K567 Ileus, unspecified: Secondary | ICD-10-CM | POA: Diagnosis not present

## 2016-11-03 DIAGNOSIS — J939 Pneumothorax, unspecified: Secondary | ICD-10-CM

## 2016-11-03 DIAGNOSIS — I471 Supraventricular tachycardia: Secondary | ICD-10-CM | POA: Diagnosis not present

## 2016-11-03 DIAGNOSIS — R000 Tachycardia, unspecified: Secondary | ICD-10-CM | POA: Diagnosis not present

## 2016-11-03 DIAGNOSIS — Z9221 Personal history of antineoplastic chemotherapy: Secondary | ICD-10-CM

## 2016-11-03 DIAGNOSIS — Z923 Personal history of irradiation: Secondary | ICD-10-CM

## 2016-11-03 DIAGNOSIS — Z4659 Encounter for fitting and adjustment of other gastrointestinal appliance and device: Secondary | ICD-10-CM

## 2016-11-03 DIAGNOSIS — Z91013 Allergy to seafood: Secondary | ICD-10-CM | POA: Diagnosis not present

## 2016-11-03 DIAGNOSIS — C3411 Malignant neoplasm of upper lobe, right bronchus or lung: Secondary | ICD-10-CM | POA: Diagnosis present

## 2016-11-03 DIAGNOSIS — Z515 Encounter for palliative care: Secondary | ICD-10-CM | POA: Diagnosis not present

## 2016-11-03 DIAGNOSIS — G9341 Metabolic encephalopathy: Secondary | ICD-10-CM | POA: Diagnosis present

## 2016-11-03 DIAGNOSIS — Z9911 Dependence on respirator [ventilator] status: Secondary | ICD-10-CM

## 2016-11-03 DIAGNOSIS — I10 Essential (primary) hypertension: Secondary | ICD-10-CM | POA: Diagnosis present

## 2016-11-03 DIAGNOSIS — J441 Chronic obstructive pulmonary disease with (acute) exacerbation: Secondary | ICD-10-CM | POA: Diagnosis present

## 2016-11-03 DIAGNOSIS — I4892 Unspecified atrial flutter: Secondary | ICD-10-CM | POA: Diagnosis not present

## 2016-11-03 DIAGNOSIS — Z90710 Acquired absence of both cervix and uterus: Secondary | ICD-10-CM | POA: Diagnosis not present

## 2016-11-03 DIAGNOSIS — I714 Abdominal aortic aneurysm, without rupture: Secondary | ICD-10-CM

## 2016-11-03 DIAGNOSIS — Z8249 Family history of ischemic heart disease and other diseases of the circulatory system: Secondary | ICD-10-CM | POA: Diagnosis not present

## 2016-11-03 DIAGNOSIS — F419 Anxiety disorder, unspecified: Secondary | ICD-10-CM | POA: Diagnosis present

## 2016-11-03 DIAGNOSIS — R739 Hyperglycemia, unspecified: Secondary | ICD-10-CM | POA: Diagnosis not present

## 2016-11-03 DIAGNOSIS — R0602 Shortness of breath: Secondary | ICD-10-CM | POA: Diagnosis present

## 2016-11-03 DIAGNOSIS — J9622 Acute and chronic respiratory failure with hypercapnia: Secondary | ICD-10-CM | POA: Diagnosis present

## 2016-11-03 DIAGNOSIS — T380X5A Adverse effect of glucocorticoids and synthetic analogues, initial encounter: Secondary | ICD-10-CM | POA: Diagnosis not present

## 2016-11-03 DIAGNOSIS — J9621 Acute and chronic respiratory failure with hypoxia: Secondary | ICD-10-CM | POA: Diagnosis present

## 2016-11-03 DIAGNOSIS — J930 Spontaneous tension pneumothorax: Secondary | ICD-10-CM

## 2016-11-03 DIAGNOSIS — Z801 Family history of malignant neoplasm of trachea, bronchus and lung: Secondary | ICD-10-CM | POA: Diagnosis not present

## 2016-11-03 DIAGNOSIS — Z825 Family history of asthma and other chronic lower respiratory diseases: Secondary | ICD-10-CM | POA: Diagnosis not present

## 2016-11-03 DIAGNOSIS — J189 Pneumonia, unspecified organism: Secondary | ICD-10-CM | POA: Diagnosis present

## 2016-11-03 DIAGNOSIS — J9602 Acute respiratory failure with hypercapnia: Secondary | ICD-10-CM

## 2016-11-03 DIAGNOSIS — J440 Chronic obstructive pulmonary disease with acute lower respiratory infection: Secondary | ICD-10-CM | POA: Diagnosis present

## 2016-11-03 DIAGNOSIS — R140 Abdominal distension (gaseous): Secondary | ICD-10-CM

## 2016-11-03 DIAGNOSIS — Z87891 Personal history of nicotine dependence: Secondary | ICD-10-CM

## 2016-11-03 DIAGNOSIS — E039 Hypothyroidism, unspecified: Secondary | ICD-10-CM | POA: Diagnosis present

## 2016-11-03 DIAGNOSIS — J9601 Acute respiratory failure with hypoxia: Secondary | ICD-10-CM | POA: Diagnosis not present

## 2016-11-03 DIAGNOSIS — K429 Umbilical hernia without obstruction or gangrene: Secondary | ICD-10-CM | POA: Diagnosis present

## 2016-11-03 DIAGNOSIS — Z7982 Long term (current) use of aspirin: Secondary | ICD-10-CM | POA: Diagnosis not present

## 2016-11-03 DIAGNOSIS — K439 Ventral hernia without obstruction or gangrene: Secondary | ICD-10-CM | POA: Diagnosis present

## 2016-11-03 DIAGNOSIS — J449 Chronic obstructive pulmonary disease, unspecified: Secondary | ICD-10-CM

## 2016-11-03 DIAGNOSIS — J9600 Acute respiratory failure, unspecified whether with hypoxia or hypercapnia: Secondary | ICD-10-CM | POA: Diagnosis not present

## 2016-11-03 DIAGNOSIS — I479 Paroxysmal tachycardia, unspecified: Secondary | ICD-10-CM | POA: Diagnosis present

## 2016-11-03 DIAGNOSIS — J438 Other emphysema: Secondary | ICD-10-CM | POA: Diagnosis not present

## 2016-11-03 DIAGNOSIS — I472 Ventricular tachycardia: Secondary | ICD-10-CM | POA: Diagnosis not present

## 2016-11-03 DIAGNOSIS — Z9981 Dependence on supplemental oxygen: Secondary | ICD-10-CM | POA: Diagnosis not present

## 2016-11-03 DIAGNOSIS — I483 Typical atrial flutter: Secondary | ICD-10-CM

## 2016-11-03 DIAGNOSIS — Z01818 Encounter for other preprocedural examination: Secondary | ICD-10-CM

## 2016-11-03 DIAGNOSIS — E872 Acidosis: Secondary | ICD-10-CM | POA: Diagnosis present

## 2016-11-03 DIAGNOSIS — I484 Atypical atrial flutter: Secondary | ICD-10-CM | POA: Diagnosis not present

## 2016-11-03 DIAGNOSIS — Z66 Do not resuscitate: Secondary | ICD-10-CM | POA: Diagnosis present

## 2016-11-03 DIAGNOSIS — Z978 Presence of other specified devices: Secondary | ICD-10-CM

## 2016-11-03 DIAGNOSIS — J432 Centrilobular emphysema: Secondary | ICD-10-CM | POA: Diagnosis not present

## 2016-11-03 DIAGNOSIS — Y95 Nosocomial condition: Secondary | ICD-10-CM | POA: Diagnosis present

## 2016-11-03 DIAGNOSIS — E0590 Thyrotoxicosis, unspecified without thyrotoxic crisis or storm: Secondary | ICD-10-CM | POA: Diagnosis present

## 2016-11-03 DIAGNOSIS — I480 Paroxysmal atrial fibrillation: Secondary | ICD-10-CM | POA: Diagnosis not present

## 2016-11-03 DIAGNOSIS — R402432 Glasgow coma scale score 3-8, at arrival to emergency department: Secondary | ICD-10-CM | POA: Diagnosis present

## 2016-11-03 DIAGNOSIS — I4891 Unspecified atrial fibrillation: Secondary | ICD-10-CM | POA: Diagnosis present

## 2016-11-03 HISTORY — DX: Paroxysmal tachycardia, unspecified: I47.9

## 2016-11-03 LAB — COMPREHENSIVE METABOLIC PANEL
ALT: 28 U/L (ref 14–54)
ANION GAP: 5 (ref 5–15)
AST: 50 U/L — ABNORMAL HIGH (ref 15–41)
Albumin: 3 g/dL — ABNORMAL LOW (ref 3.5–5.0)
Alkaline Phosphatase: 94 U/L (ref 38–126)
BILIRUBIN TOTAL: 0.8 mg/dL (ref 0.3–1.2)
BUN: 14 mg/dL (ref 6–20)
CALCIUM: 8.4 mg/dL — AB (ref 8.9–10.3)
CO2: 30 mmol/L (ref 22–32)
CREATININE: 0.89 mg/dL (ref 0.44–1.00)
Chloride: 108 mmol/L (ref 101–111)
GFR calc non Af Amer: 60 mL/min — ABNORMAL LOW (ref >60)
Glucose, Bld: 209 mg/dL — ABNORMAL HIGH (ref 65–99)
Potassium: 4.3 mmol/L (ref 3.5–5.1)
Sodium: 143 mmol/L (ref 135–145)
TOTAL PROTEIN: 5.6 g/dL — AB (ref 6.5–8.1)

## 2016-11-03 LAB — I-STAT CHEM 8, ED
BUN: 17 mg/dL (ref 6–20)
CALCIUM ION: 1.15 mmol/L (ref 1.15–1.40)
CHLORIDE: 102 mmol/L (ref 101–111)
CREATININE: 0.9 mg/dL (ref 0.44–1.00)
GLUCOSE: 202 mg/dL — AB (ref 65–99)
HCT: 44 % (ref 36.0–46.0)
Hemoglobin: 15 g/dL (ref 12.0–15.0)
Potassium: 3.9 mmol/L (ref 3.5–5.1)
Sodium: 143 mmol/L (ref 135–145)
TCO2: 31 mmol/L (ref 0–100)

## 2016-11-03 LAB — TYPE AND SCREEN
ABO/RH(D): O POS
Antibody Screen: NEGATIVE

## 2016-11-03 LAB — CBC WITH DIFFERENTIAL/PLATELET
Basophils Absolute: 0 10*3/uL (ref 0.0–0.1)
Basophils Relative: 0 %
Eosinophils Absolute: 0.2 10*3/uL (ref 0.0–0.7)
Eosinophils Relative: 2 %
HEMATOCRIT: 43.3 % (ref 36.0–46.0)
HEMOGLOBIN: 12.7 g/dL (ref 12.0–15.0)
LYMPHS ABS: 4.2 10*3/uL — AB (ref 0.7–4.0)
LYMPHS PCT: 30 %
MCH: 26.2 pg (ref 26.0–34.0)
MCHC: 29.3 g/dL — ABNORMAL LOW (ref 30.0–36.0)
MCV: 89.3 fL (ref 78.0–100.0)
Monocytes Absolute: 0.9 10*3/uL (ref 0.1–1.0)
Monocytes Relative: 6 %
NEUTROS ABS: 8.6 10*3/uL — AB (ref 1.7–7.7)
Neutrophils Relative %: 62 %
PLATELETS: 186 10*3/uL (ref 150–400)
RBC: 4.85 MIL/uL (ref 3.87–5.11)
RDW: 15.3 % (ref 11.5–15.5)
WBC: 14 10*3/uL — AB (ref 4.0–10.5)

## 2016-11-03 LAB — I-STAT CG4 LACTIC ACID, ED
Lactic Acid, Venous: 2.21 mmol/L (ref 0.5–1.9)
Lactic Acid, Venous: 1.66 mmol/L (ref 0.5–1.9)

## 2016-11-03 LAB — LACTIC ACID, PLASMA: Lactic Acid, Venous: 1.7 mmol/L (ref 0.5–1.9)

## 2016-11-03 LAB — MAGNESIUM: Magnesium: 2.2 mg/dL (ref 1.7–2.4)

## 2016-11-03 LAB — PHOSPHORUS: PHOSPHORUS: 4.3 mg/dL (ref 2.5–4.6)

## 2016-11-03 LAB — ABO/RH: ABO/RH(D): O POS

## 2016-11-03 LAB — BRAIN NATRIURETIC PEPTIDE: B NATRIURETIC PEPTIDE 5: 106.8 pg/mL — AB (ref 0.0–100.0)

## 2016-11-03 LAB — TRIGLYCERIDES: TRIGLYCERIDES: 101 mg/dL (ref <150)

## 2016-11-03 LAB — I-STAT TROPONIN, ED: Troponin i, poc: 0 ng/mL (ref 0.00–0.08)

## 2016-11-03 MED ORDER — SODIUM CHLORIDE 0.9 % IV BOLUS (SEPSIS)
1000.0000 mL | Freq: Once | INTRAVENOUS | Status: AC
  Administered 2016-11-03: 1000 mL via INTRAVENOUS

## 2016-11-03 MED ORDER — HEPARIN SODIUM (PORCINE) 5000 UNIT/ML IJ SOLN
5000.0000 [IU] | Freq: Three times a day (TID) | INTRAMUSCULAR | Status: DC
  Administered 2016-11-03 – 2016-11-11 (×22): 5000 [IU] via SUBCUTANEOUS
  Filled 2016-11-03 (×20): qty 1

## 2016-11-03 MED ORDER — PROPOFOL 1000 MG/100ML IV EMUL
INTRAVENOUS | Status: AC
  Filled 2016-11-03: qty 100

## 2016-11-03 MED ORDER — METHYLPREDNISOLONE SODIUM SUCC 125 MG IJ SOLR
60.0000 mg | Freq: Two times a day (BID) | INTRAMUSCULAR | Status: DC
  Administered 2016-11-03 – 2016-11-05 (×5): 60 mg via INTRAVENOUS
  Filled 2016-11-03 (×6): qty 2

## 2016-11-03 MED ORDER — METHIMAZOLE 5 MG PO TABS
5.0000 mg | ORAL_TABLET | Freq: Every day | ORAL | Status: DC
  Filled 2016-11-03: qty 1

## 2016-11-03 MED ORDER — LEVALBUTEROL HCL 0.63 MG/3ML IN NEBU
0.6300 mg | INHALATION_SOLUTION | RESPIRATORY_TRACT | Status: DC | PRN
  Administered 2016-11-04 – 2016-11-05 (×2): 0.63 mg via RESPIRATORY_TRACT
  Filled 2016-11-03 (×2): qty 3

## 2016-11-03 MED ORDER — LABETALOL HCL 5 MG/ML IV SOLN
20.0000 mg | INTRAVENOUS | Status: DC | PRN
  Administered 2016-11-04 – 2016-11-05 (×3): 20 mg via INTRAVENOUS
  Filled 2016-11-03 (×3): qty 4

## 2016-11-03 MED ORDER — MIDAZOLAM HCL 2 MG/2ML IJ SOLN: 1.0000 mg | INTRAMUSCULAR | Status: DC | PRN

## 2016-11-03 MED ORDER — ALBUTEROL (5 MG/ML) CONTINUOUS INHALATION SOLN
5.0000 mg/h | INHALATION_SOLUTION | Freq: Once | RESPIRATORY_TRACT | Status: AC
  Administered 2016-11-03: 5 mg/h via RESPIRATORY_TRACT
  Filled 2016-11-03: qty 20

## 2016-11-03 MED ORDER — FENTANYL CITRATE (PF) 100 MCG/2ML IJ SOLN
50.0000 ug | INTRAMUSCULAR | Status: DC | PRN
  Administered 2016-11-03: 50 ug via INTRAVENOUS

## 2016-11-03 MED ORDER — ESMOLOL HCL-SODIUM CHLORIDE 2000 MG/100ML IV SOLN
25.0000 ug/kg/min | Freq: Once | INTRAVENOUS | Status: AC
Start: 2016-11-03 — End: 2016-11-04
  Administered 2016-11-03: 25 ug/kg/min via INTRAVENOUS
  Filled 2016-11-03: qty 100

## 2016-11-03 MED ORDER — SODIUM CHLORIDE 0.9 % IV SOLN: 250.0000 mL | INTRAVENOUS | Status: DC | PRN

## 2016-11-03 MED ORDER — PANTOPRAZOLE SODIUM 40 MG IV SOLR
40.0000 mg | Freq: Every day | INTRAVENOUS | Status: DC
  Administered 2016-11-03 – 2016-11-04 (×2): 40 mg via INTRAVENOUS
  Filled 2016-11-03 (×2): qty 40

## 2016-11-03 MED ORDER — IPRATROPIUM-ALBUTEROL 0.5-2.5 (3) MG/3ML IN SOLN
3.0000 mL | RESPIRATORY_TRACT | Status: DC | PRN
  Administered 2016-11-03: 3 mL via RESPIRATORY_TRACT
  Filled 2016-11-03: qty 3

## 2016-11-03 MED ORDER — ETOMIDATE 2 MG/ML IV SOLN
20.0000 mg | Freq: Once | INTRAVENOUS | Status: AC
  Administered 2016-11-03: 20 mg via INTRAVENOUS

## 2016-11-03 MED ORDER — PHENYLEPHRINE 40 MCG/ML (10ML) SYRINGE FOR IV PUSH (FOR BLOOD PRESSURE SUPPORT)
PREFILLED_SYRINGE | INTRAVENOUS | Status: AC
  Administered 2016-11-03: 100 ug
  Filled 2016-11-03: qty 10

## 2016-11-03 MED ORDER — FENTANYL CITRATE (PF) 100 MCG/2ML IJ SOLN: 50.0000 ug | INTRAMUSCULAR | Status: DC | PRN

## 2016-11-03 MED ORDER — ASPIRIN 81 MG PO CHEW
81.0000 mg | CHEWABLE_TABLET | Freq: Every day | ORAL | Status: DC
  Administered 2016-11-04 – 2016-11-09 (×6): 81 mg
  Filled 2016-11-03 (×7): qty 1

## 2016-11-03 MED ORDER — FENTANYL CITRATE (PF) 100 MCG/2ML IJ SOLN
50.0000 ug | Freq: Once | INTRAMUSCULAR | Status: AC
  Filled 2016-11-03: qty 2

## 2016-11-03 MED ORDER — IOPAMIDOL (ISOVUE-370) INJECTION 76%
INTRAVENOUS | Status: AC
  Administered 2016-11-03: 100 mL
  Filled 2016-11-03: qty 100

## 2016-11-03 MED ORDER — MIDAZOLAM HCL 2 MG/2ML IJ SOLN
1.0000 mg | INTRAMUSCULAR | Status: DC | PRN
  Administered 2016-11-03: 1 mg via INTRAVENOUS
  Filled 2016-11-03: qty 2

## 2016-11-03 MED ORDER — FENTANYL CITRATE (PF) 100 MCG/2ML IJ SOLN
50.0000 ug | INTRAMUSCULAR | Status: AC | PRN
  Administered 2016-11-03 (×3): 50 ug via INTRAVENOUS
  Filled 2016-11-03 (×3): qty 2

## 2016-11-03 MED ORDER — FENTANYL CITRATE (PF) 100 MCG/2ML IJ SOLN
50.0000 ug | INTRAMUSCULAR | Status: DC | PRN
  Administered 2016-11-04 – 2016-11-05 (×2): 50 ug via INTRAVENOUS

## 2016-11-03 MED ORDER — DEXTROSE 5 % IV SOLN
2.0000 g | INTRAVENOUS | Status: DC
  Administered 2016-11-03 – 2016-11-05 (×3): 2 g via INTRAVENOUS
  Filled 2016-11-03 (×3): qty 2

## 2016-11-03 MED ORDER — PROPOFOL 1000 MG/100ML IV EMUL: 0.0000 ug/kg/min | INTRAVENOUS | Status: DC

## 2016-11-03 MED ORDER — IPRATROPIUM-ALBUTEROL 0.5-2.5 (3) MG/3ML IN SOLN
3.0000 mL | Freq: Four times a day (QID) | RESPIRATORY_TRACT | Status: DC
  Administered 2016-11-04 – 2016-11-10 (×26): 3 mL via RESPIRATORY_TRACT
  Filled 2016-11-03 (×25): qty 3

## 2016-11-03 MED ORDER — FENTANYL CITRATE (PF) 100 MCG/2ML IJ SOLN
INTRAMUSCULAR | Status: AC
  Filled 2016-11-03: qty 2

## 2016-11-03 MED ORDER — ROCURONIUM BROMIDE 50 MG/5ML IV SOLN
70.0000 mg | Freq: Once | INTRAVENOUS | Status: AC
  Administered 2016-11-03: 70 mg via INTRAVENOUS

## 2016-11-03 NOTE — ED Notes (Signed)
Admitting MD at bedside.

## 2016-11-03 NOTE — ED Notes (Signed)
Patient alert and following commands now. Family remains at bedside, comforting patient. Pt nodded in understanding of need to leave breathing tube alone and appears to be more comfortable following fentanyl administration.

## 2016-11-03 NOTE — Progress Notes (Signed)
Patient transported from ED to 2O11 with no complications.

## 2016-11-03 NOTE — ED Provider Notes (Signed)
[facility] DEPT Provider Note   CSN: [id number] Arrival date & time: 10/07/2016  1915  Level V caveat secondary to patient being unresponsive   History   Chief Complaint Chief Complaint  Patient presents with  . Respiratory Distress    HPI Susan Garrett is a 82 y.o. female.  The history is provided by the EMS personnel. The history is limited by the condition of the patient.  Shortness of Breath  This is a new problem. Duration: unclear but reports of 1 day of sob. The problem occurs intermittently.Episode onset: today. The problem has been rapidly worsening. Associated symptoms comments: Family states that she was having some shortness of breath today but it did not seem very unusual compared to her baseline. She did state to family that she wanted to go get checked out today. Next thing family knows patient was on their way to the hospital via EMS. Otherwise in her usual state of health the past few days. Precipitated by: hx of copd. Treatments tried: got steroids, mag, and breathing treatments with ems. The treatment provided no relief. Associated medical issues include COPD.    Past Medical History:  Diagnosis Date  . Callus   . Corns and callosities   . Encounter for antineoplastic chemotherapy 11/13/2014  . Encounter for antineoplastic chemotherapy 11/13/2014  . Hypertension   . Hyperthyroidism     Patient Active Problem List   Diagnosis Date Noted  . Chronic respiratory failure with hypoxia and hypercapnia ([place]) 10/27/2015  . Left breast lump 05/14/2015  . Protein-calorie malnutrition, severe ([place]) 01/15/2015  . Hypoxia 01/14/2015  . COPD exacerbation ([place]) 01/14/2015  . Essential hypertension 01/14/2015  . Hyperthyroidism 01/14/2015  . Protein calorie malnutrition ([place]) 01/14/2015  . Encounter for antineoplastic chemotherapy 11/13/2014  . Primary cancer of right upper lobe of lung ([place]) 11/01/2014  . Mass of lung 10/13/2014  . COPD GOLD IV 10/13/2014  . Dyspnea  10/12/2014  . Pain in joint, ankle and foot 12/30/2012  . Callus 10/04/2012    Past Surgical History:  Procedure Laterality Date  . LESION REMOVAL Left 11/12/87  . VAGINAL HYSTERECTOMY    . VIDEO BRONCHOSCOPY Bilateral 10/18/2014   Procedure: VIDEO BRONCHOSCOPY WITH FLUORO;  Surgeon: Tanda Rockers, MD;  Location: [facility] ENDOSCOPY;  Service: Endoscopy;  Laterality: Bilateral;    OB History    No data available       Home Medications    Prior to Admission medications   Medication Sig Start Date End Date Taking? Authorizing Provider  aspirin 81 MG tablet Take 81 mg by mouth daily.    [provider]  Fluocinonide 0.1 % CREA Use as directed 06/29/16   [provider]  Glycopyrrolate-Formoterol (BEVESPI AEROSPHERE) 9-4.8 MCG/ACT AERO Inhale 2 puffs into the lungs 2 (two) times daily. 08/11/16   Tanda Rockers, MD  methimazole (TAPAZOLE) 5 MG tablet Take 1 tablet by mouth daily. 07/28/16   [provider]  metoprolol succinate (TOPROL-XL) 25 MG 24 hr tablet Take 25 mg by mouth daily.  08/02/13   [provider]  OXYGEN Inhale 2 L into the lungs continuous.    [provider]  polyethylene glycol (MIRALAX / GLYCOLAX) packet Take 17 g by mouth daily.    [provider]  PROAIR HFA 108 [phone] Base) MCG/ACT inhaler Inhale 2 puffs into the lungs every 6 (six) hours as needed. 02/11/16   [provider]  sennosides-docusate sodium (SENOKOT-S) 8.6-50 MG tablet Take 1 tablet by mouth daily.  Reported on 12/05/2015    [provider]    Family History Family History  Problem Relation Age of Onset  . Heart disease Father   . Asthma Father   . Cancer Mother        gastric  . Cancer Brother        lung    Social History Social History  Substance Use Topics  . Smoking status: Former Smoker    Packs/day: 0.50    Years: 55.00    Types: Cigarettes    Quit date: 08/07/2014  . Smokeless tobacco: Never Used  . Alcohol use No      Allergies   Fish allergy and Shellfish allergy   Review of Systems Review of Systems  Unable to perform ROS: Patient unresponsive  Respiratory: Positive for shortness of breath.      Physical Exam Updated Vital Signs BP (!) 150/110 (BP Location: Left Arm)   Pulse (!) 133   Resp 18   SpO2 100%  ED Triage Vitals  Enc Vitals Group     BP 10/30/2016 1922 (!) 150/110     Pulse Rate 10/30/2016 1922 (!) 133     Resp 10/24/2016 1922 18     Temp --      Temp src --      SpO2 10/24/2016 1915 100 %     Weight --      Height 10/27/2016 1932 _0  (1.727 m)     Head Circumference --      Peak Flow --      Pain Score 10/28/2016 1917 0     Pain Loc --      Pain Edu? --      Excl. in University? --     Physical Exam  Constitutional:  Patient arrives unresponsive with a facemask in place  HENT:  Head: Normocephalic and atraumatic.  Neck:  Patient not maintaining muscular tone  Cardiovascular: Regular rhythm and normal heart sounds.   No murmur heard. Tachy Asymmetric pulses in femorals  Pulmonary/Chest:  Patient has very distant breath sounds are very faint wheezes and crackles throughout but it does appear symmetric  Abdominal: Soft. There is no tenderness.  Musculoskeletal: She exhibits no edema or deformity.  Neurological: She is unresponsive. GCS eye subscore is 1. GCS verbal subscore is 1. GCS motor subscore is 1.  Skin: Skin is warm. No rash noted.  Nursing note and vitals reviewed.    ED Treatments / Results  Labs (all labs ordered are listed, but only abnormal results are displayed) Labs Reviewed - No data to display  EKG  EKG Interpretation None       Radiology No results found.  Procedures Procedure Name: Intubation Date/Time: 10/18/2016 8:43 PM Performed by: Heriberto Antigua Oxygen Delivery Method: Non-rebreather mask Preoxygenation: Pre-oxygenation with 100% oxygen Intubation Type: Rapid sequence Ventilation: Mask ventilation without difficulty Laryngoscope  Size: Mac and 3 Grade View: Grade II Tube type: Subglottic suction tube Tube size: 7.5 mm Number of attempts: 1 Airway Equipment and Method: Video-laryngoscopy Placement Confirmation: ETT inserted through vocal cords under direct vision,  Positive ETCO2 and Breath sounds checked- equal and bilateral Secured at: 26 cm Tube secured with: ETT holder Comments: Tube appeared to be in right main stem after chest x-ray and was retracted to appropriate position and reconfirmed with x-ray. Bottom dentures removed prior to intubation.      CHEST TUBE INSERTION Date/Time: 10/19/2016 11:17 PM Performed by: Heriberto Antigua Authorized by: Quintella Reichert   Consent:  Consent obtained:  Emergent situation   Consent given by: family at bedside. Pre-procedure details:    Skin preparation:  ChloraPrep   Preparation: Patient was prepped and draped in the usual sterile fashion   Sedation:    Sedation type: intubated and sedated using intermittent fentanyl and versed. Anesthesia (see MAR for exact dosages):    Anesthesia method:  Local infiltration   Local anesthetic:  Lidocaine 1% w/o epi Procedure details:    Placement location:  R lateral   Scalpel size:  11   Tube size ([place]): 14 french pigtail catheter.   Dissection instrument: dilation over wire.   Tube connected to:  Suction (one way valve)   Drainage characteristics:  Air only   Suture material:  0 silk   Dressing:  4x4 sterile gauze Post-procedure details:    Post-insertion x-ray findings: tube in good position     Patient tolerance of procedure:  Tolerated well, no immediate complications   (including critical care time)  Medications Ordered in ED Medications  etomidate (AMIDATE) injection 20 mg (not administered)  rocuronium (ZEMURON) injection 70 mg (not administered)     Initial Impression / Assessment and Plan / ED Course  I have reviewed the triage vital signs and the nursing notes.  Pertinent labs & imaging results  that were available during my care of the patient were reviewed by me and considered in my medical decision making (see chart for details).     Patient is a 82 year old female who presents with EMS for concern shortness of breath. Upon arrival she is unresponsive with a GCS of 3. She is agonal respirations. She was hypertensive with EMS but reportedly was a GCS of 15 throughout most transport. At this time she was intubated for airway protection. Further exam reveals very distant breath sounds with faint wheezing. She was started on breathing treatments. She regarding received breathing treatments and Solu-Medrol as well as magnesium EMS. Differential includes COPD exacerbation, cardiac event, given her abrupt onset of mental status deterioration possible neurologic event or vascular process.   chest x-ray reveals a right-sided pneumothorax as well. Labs reveal acidosis as well as elevated CO2. Troponin negative and EKG without obvious acute ischemic changes. CT scans of her head chest abdomen pelvis obtained with no acute intracranial pathology but did reveal a infrarenal aortic aneurysm with possible intramural hemorrhage. We discussed this with radiology as well as vascular surgery reviewed images given no evidence of end organ damage at this time do not recommend any acute surgical management. Patient was started on esmolol. Chest tube was placed as above for pneumothorax demonstrated on CT scan as well. During her stay in the ED her mental status rapidly improved to the point that she is following commands and trying to pull at the ET tube and chest tube. Patient will be admitted to critical care for further management and evaluation. This time concern for likely all COPD exacerbation.   Final Clinical Impressions(s) / ED Diagnoses   Final diagnoses:  None    New Prescriptions New Prescriptions   No medications on file     Heriberto Antigua, MD 11/04/16 1622    Quintella Reichert, MD 11/12/16  [phone]

## 2016-11-03 NOTE — ED Notes (Signed)
20mg  etomidate given by Mortimer Fries, RN

## 2016-11-03 NOTE — ED Notes (Signed)
EDP at bedside, inserting chest tube.

## 2016-11-03 NOTE — H&P (Signed)
PULMONARY / CRITICAL CARE MEDICINE   Name: Susan Garrett MRN: [id number] DOB: 13-May-1935    ADMISSION DATE:  10/17/2016 CONSULTATION DATE:  11/04/2016  REFERRING MD:  Ralene Bathe  CHIEF COMPLAINT:  SOB  HISTORY OF PRESENT ILLNESS:  Pt is encephelopathic; therefore, this HPI is obtained from chart review. Susan Garrett is a 82 y.o. female with PMH as outlined below including COPD on chronic 2L O2 (followed by Dr. Melvyn Novas) and  RUL lung CA s/p chemoradiation with chemotherapy in 2016 (followed by Dr. Earlie Server). She was in her usual state of health until the evening of 10/31/2016 when she called her neighbor due to difficulty breathing.  Pt's sister had spoken to pt over the phone roughly 1 hour earlier and stated that pt was fine at that time.  Pt's neighbor stated that he could tell she was in distress as she was not talking normally.  EMS was dispatched and upon their arrival, pt was noted to have SpO2 of 85% on 2L.  En route to ED, she became unresponsive and remained so while in ED.  Decision was made to intubate given AMS.  ABG demonstrated acute hypercarbic respiratory failure.  Pt was reportedly very bronchospastic; therefore, received 2 breathing treatments.  CXR demonstrated stable cardiomegaly and atherosclerosis, along with possible small right pneumothorax.  CT of the head and chest are pending.  PAST MEDICAL HISTORY :  She  has a past medical history of Callus; Corns and callosities; Encounter for antineoplastic chemotherapy (11/13/2014); Encounter for antineoplastic chemotherapy (11/13/2014); Hypertension; and Hyperthyroidism.  PAST SURGICAL HISTORY: She  has a past surgical history that includes Lesion removal (Left, 11/12/87); Vaginal hysterectomy; and Video bronchoscopy (Bilateral, 10/18/2014).  Allergies  Allergen Reactions  . Fish Allergy Anaphylaxis    Patient "cannot move" (also)  . Shellfish Allergy Anaphylaxis  . Latex Rash  . Tape Rash    No current facility-administered  medications on file prior to encounter.    Current Outpatient Prescriptions on File Prior to Encounter  Medication Sig  . aspirin 81 MG tablet Take 81 mg by mouth daily.  . Glycopyrrolate-Formoterol (BEVESPI AEROSPHERE) 9-4.8 MCG/ACT AERO Inhale 2 puffs into the lungs 2 (two) times daily.  . methimazole (TAPAZOLE) 5 MG tablet Take 5 mg by mouth daily.   . metoprolol succinate (TOPROL-XL) 25 MG 24 hr tablet Take 25 mg by mouth daily.   . OXYGEN Inhale 2 L into the lungs continuous.  . polyethylene glycol (MIRALAX / GLYCOLAX) packet Take 17 g by mouth daily.  [name] PROAIR HFA 108 (90 Base) MCG/ACT inhaler Inhale 2 puffs into the lungs every 4 (four) hours as needed for wheezing or shortness of breath.   . Fluocinonide 0.1 % CREA Use as directed  . sennosides-docusate sodium (SENOKOT-S) 8.6-50 MG tablet Take 1 tablet by mouth daily. Reported on 12/05/2015    FAMILY HISTORY:  Her indicated that the status of her mother is unknown. She indicated that the status of her father is unknown. She indicated that the status of her brother is unknown.    SOCIAL HISTORY: She  reports that she quit smoking about 2 years ago. Her smoking use included Cigarettes. She has a 27.50 pack-year smoking history. She has never used smokeless tobacco. She reports that she does not drink alcohol or use drugs.  REVIEW OF SYSTEMS:   Unable to obtain as pt is encephalopathic.  SUBJECTIVE:  On vent, unresponsive.  VITAL SIGNS: BP (!) 159/125   Pulse (!) 137   Resp 14  Ht 5\' 8"  (1.727 m)   SpO2 96%   HEMODYNAMICS:    VENTILATOR SETTINGS: Vent Mode: PCV FiO2 (%):  [40 %-100 %] 40 % Set Rate:  [18 bmp] 18 bmp Vt Set:  [500 mL] 500 mL PEEP:  [5 cmH20] 5 cmH20 Plateau Pressure:  [25 cmH20-27 cmH20] 25 cmH20  INTAKE / OUTPUT: No intake/output data recorded.   PHYSICAL EXAMINATION: General: Elderly female, unresponsive. Neuro: Sedated, unresponsive. HEENT: [place]/AT. PERRL, sclerae anicteric. Cardiovascular:  Tachy, regular, no M/R/G.  Lungs: Respirations even and unlabored.  Faint basilar wheezes. Abdomen: BS x 4, soft, NT/ND.  Musculoskeletal: No gross deformities, no edema.  Skin: Intact, warm, no rashes.  LABS:  BMET  Recent Labs Lab 10/18/2016 1929 10/13/2016 2032  NA 143 143  K 4.3 3.9  CL 108 102  CO2 30  --   BUN 14 17  CREATININE 0.89 0.90  GLUCOSE 209* 202*    Electrolytes  Recent Labs Lab 10/24/2016 1929  CALCIUM 8.4*    CBC  Recent Labs Lab 10/24/2016 1929 11/02/2016 2032  WBC 14.0*  --   HGB 12.7 15.0  HCT 43.3 44.0  PLT 186  --     Coag's No results for input(s): APTT, INR in the last 168 hours.  Sepsis Markers  Recent Labs Lab 10/15/2016 2033  LATICACIDVEN 2.21*    ABG No results for input(s): PHART, PCO2ART, PO2ART in the last 168 hours.  Liver Enzymes  Recent Labs Lab 10/22/2016 1929  AST 50*  ALT 28  ALKPHOS 94  BILITOT 0.8  ALBUMIN 3.0*    Cardiac Enzymes No results for input(s): TROPONINI, PROBNP in the last 168 hours.  Glucose No results for input(s): GLUCAP in the last 168 hours.  Imaging Dg Chest Port 1 View  Result Date: 10/18/2016 CLINICAL DATA:  82 y/o  F; shortness of breath. EXAM: PORTABLE CHEST 1 VIEW COMPARISON:  09/25/2015 chest radiograph. FINDINGS: Stable cardiomegaly. Emphysema and right mid lung zone scarring. Small right pneumothorax. Chronic blunting of left costal diaphragmatic angle. Aortic atherosclerosis with calcification. Enteric tube tip below the field of view. Endotracheal tube tip 3.7 cm from carina on 7 image after readjustment. IMPRESSION: 1. Small right pneumothorax. 2. Stable cardiomegaly and aortic atherosclerosis. 3. Endotracheal tube 3.7 cm from carina. Enteric tube tip below the field of view and abdomen. These results were called by telephone at the time of interpretation on 10/11/2016 at 7:55 pm to Dr. 11/05/2016 , who verbally acknowledged these results. Electronically Signed   By: Tilden Fossa M.D.   On: 10/20/2016 19:56     STUDIES:  CXR 5/29 > stable cardiomegaly and atherosclerosis, along with possible small right pneumothorax.  CT head 5/29 >  CT chest 5/29 > PTX rt  CULTURES: Blood 5/29 >  Sputum 5/29 >   ANTIBIOTICS: Ceftriaxone 5/29 >   SIGNIFICANT EVENTS: 5/29 > admit.  LINES/TUBES: ETT 5/29 >   DISCUSSION: 82 y.o. female with hx O2 dependent COPD and remote RUL lung CA, admitted 5/29 with acute hypercarbic respiratory failure and AECOPD requiring intubation.  ASSESSMENT / PLAN:  PULMONARY A: Acute hypercarbic with AoC hypoxic respiratory failure. SPONT PTX AECOPD - on 2L O2 chronically (followed by Dr. 6/29). ? RUL pneumothorax vs bleb. Hx RUL lung CA - s/p chemoradiation and chemotherapy 2016 (followed by Dr. 2017). P:   Full vent support. Wean as able. VAP prevention measures. SBT in AM if able. Duonebs / Levalbuterol. Solumedrol, Ceftriaxone. F/u CT chest - if true PTX,  may require chest tube placement. CXR in AM.  CARDIOVASCULAR A:  Hx HTN. P:  Labetalol PRN. Trend lactate. Continue preadmission ASA. Hold preadmission toprol-xl.  RENAL A:   No acute issues. P:   Correct electrolytes as indicated. BMP in AM.  GASTROINTESTINAL A:   GI prophylaxis. Nutrition. P:   SUP: Pantoprazole. NPO. Start TF's in AM if remains intubated.  HEMATOLOGIC A:   VTE Prophylaxis. P:  SCD's / heparin. CBC in AM.  INFECTIOUS A:   AECOPD. P:   Abx as above (ceftriaxone).  Follow cultures as above.  ENDOCRINE A:   Hyperthyroidism.   P:   Continue preadmission methimazole.  NEUROLOGIC A:   Acute hypercarbic encephalopathy. P:   Sedation:  Propofol gtt / Fentanyl PRN. RASS goal: 0 to -1. Daily WUA. F/u CT head.  Family updated: Multiple family members updated at bedside including sister, niece (niece is Dr. Landry Mellow who is OB/GYN for [facility]), neighbors.  Interdisciplinary Family Meeting v Palliative Care  Meeting:  Due by: 11/09/16.  CC time: 30 min.   Montey Hora, [facility] Pager: [phone]  or [phone] 10/19/2016, 9:32 PM  STAFF NOTE: I, Merrie Roof, MD FACP have personally reviewed patient's available data, including medical history, events of note, physical examination and test results as part of my evaluation. I have discussed with resident/NP and other care providers such as pharmacist, RN and RRT. In addition, I personally evaluated patient and elicited key findings of: awake, follows commands, agitation present mild, chest tube with leak noted, coarse BS wheezin mild left, lower ext without edema, abdo soft,  Clinical circumstance c/w likey bleb burst leading to spont PTX in setting od severe chronic lung dz, I reviewed her pcxr and CT which showed no acute on brain, PTX moderate and chronic lung changes, pcxr after chest tube shows resolved ptx and air in sub q, ABG on vent reviewed, would want to reduce rate and avoid alk, also peep to 3 as leak still present, she has some bloody secretions after chest tube, will keep CTX for now, but low threshold to dc this, asses pcxr in am , pct assessment, weaning is goal cpap 5 ps 5, from ps 8, follow TV response, if back to vent would use PRVC rate 12 8 cc/kg, peep 3, she is hyperglycemia, SSI add from steroids, if leak remains would have cvts, not needed at this stage, lactic noted, bolus further, lactic is likley from resp failure, sending sputum off culture, will feed if not extubated, would want her off pos pressure asap with leak, I updated nice in room in full The patient is critically ill with multiple organ systems failure and requires high complexity decision making for assessment and support, frequent evaluation and titration of therapies, application of advanced monitoring technologies and extensive interpretation of multiple databases.   Critical Care Time devoted to patient care  services described in this note is 30 Minutes. This time reflects time of care of this signee: Merrie Roof, MD FACP. This critical care time does not reflect procedure time, or teaching time or supervisory time of PA/NP/Med student/Med Resident etc but could involve care discussion time. Rest per NP/medical resident whose note is outlined above and that I agree with   Lavon Paganini. Titus Mould, MD, [place] Pgr: [facility] 11/04/2016 8:37 AM

## 2016-11-03 NOTE — ED Notes (Signed)
Family at bedside and talking with EDP. Family given emotional support.

## 2016-11-03 NOTE — ED Notes (Signed)
70mg  rocuronium given by Mortimer Fries, RN

## 2016-11-03 NOTE — ED Triage Notes (Signed)
Per [facility]: Pt to ED from home initial c/o SOB worsening today - hx COPD, on 2-3L O2 [place] all the time. 85% on 2L upon EMS arrival, respirations labored, placed on 15L NRB, increased SpO2 to 100%. Per EMS, pt was initially A&O and became unresponsive en route. Patient diaphoretic with EMS. Decreased breath sounds upon arrival - intubated by EDP d/t GCS decreasing to 3. Given 125mg  solumedrol, 10mg  albuterol, 0.5mg  atrovent, and 2g magnesium. EMS VS: HR 120 ST, hypertensive 214/140 - decreased to 170/120 en route, R 40.

## 2016-11-03 NOTE — Progress Notes (Signed)
Patient transported from ED to CT and back with no complications.

## 2016-11-03 NOTE — Consult Note (Addendum)
[facility]ist [facility]  Patient name: Susan Garrett MRN: [id number] DOB: [birth year] Sex: female   REQUESTING PROVIDER:    ER   REASON FOR CONSULT:    AAA  HISTORY OF PRESENT ILLNESS:   Susan Garrett is a 82 y.o. female, who presented to the ED via EMS.  She spoke with a neighbor who could tell she was in distress and not talking normally.  Upon arrival of EMS, she was hypoxic with O2 sats in the mid 80's on 2L.  En route to the ER, she became unresponsive.  She was intubated in the ER.    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  She was treated with chemo and radiation.  SHe is on home O2.  She has a history of smoking. She is medically managed for hypertension.  PAST MEDICAL HISTORY    Past Medical History:  Diagnosis Date  . Callus   . Corns and callosities   . Encounter for antineoplastic chemotherapy 11/13/2014  . Encounter for antineoplastic chemotherapy 11/13/2014  . Hypertension   . Hyperthyroidism      FAMILY HISTORY   Family History  Problem Relation Age of Onset  . Heart disease Father   . Asthma Father   . Cancer Mother        gastric  . Cancer Brother        lung    SOCIAL HISTORY:   Social History   Social History  . Marital status: Widowed    Spouse name: N/A  . Number of children: N/A  . Years of education: N/A   Occupational History  . Not on file.   Social History Main Topics  . Smoking status: Former Smoker    Packs/day: 0.50    Years: 55.00    Types: Cigarettes    Quit date: 08/07/2014  . Smokeless tobacco: Never Used  . Alcohol use No  . Drug use: No  . Sexual activity: Not on file   Other Topics Concern  . Not on file   Social History Narrative  . No narrative on file    ALLERGIES:    Allergies  Allergen Reactions  . Fish Allergy Anaphylaxis    Patient "cannot move" (also)  . Shellfish Allergy  Anaphylaxis  . Latex Rash  . Tape Rash    CURRENT MEDICATIONS:    Current Facility-Administered Medications  Medication Dose Route Frequency Provider Last Rate Last Dose  . 0.9 %  sodium chloride infusion  250 mL Intravenous PRN Desai, Rahul P, PA-C      . aspirin chewable tablet 81 mg  81 mg Per Tube Daily Desai, Rahul P, PA-C      . cefTRIAXone (ROCEPHIN) 2 g in dextrose 5 % 50 mL IVPB  2 g Intravenous Q24H Quintella Reichert, MD   Stopped at 10/16/2016 2221  . fentaNYL (SUBLIMAZE) injection 50 mcg  50 mcg Intravenous Q15 min PRN Shearon Stalls, Rahul P, PA-C   50 mcg at 10/31/2016 2220  . fentaNYL (SUBLIMAZE) injection 50 mcg  50 mcg Intravenous Q2H PRN Shearon Stalls, Rahul P, PA-C      . heparin injection 5,000 Units  5,000 Units Subcutaneous Q8H Desai, Rahul P, PA-C   5,000 Units at 10/10/2016 2155  . ipratropium-albuterol (DUONEB) 0.5-2.5 (3) MG/3ML nebulizer solution 3 mL  3 mL Nebulization Q6H Desai, Rahul P, PA-C      . labetalol (NORMODYNE,TRANDATE) injection 20 mg  20 mg Intravenous Q10 min PRN Desai, Rahul P, PA-C      . levalbuterol (XOPENEX) nebulizer solution 0.63 mg  0.63 mg Nebulization Q3H PRN Shearon Stalls, Rahul P, PA-C      . [START ON 11/04/2016] methimazole (TAPAZOLE) tablet 5 mg  5 mg Per Tube Daily Desai, Rahul P, PA-C      . methylPREDNISolone sodium succinate (SOLU-MEDROL) 125 mg/2 mL injection 60 mg  60 mg Intravenous Q12H Desai, Rahul P, PA-C   60 mg at 11/01/2016 2154  . pantoprazole (PROTONIX) injection 40 mg  40 mg Intravenous QHS Desai, Rahul P, PA-C   40 mg at 10/22/2016 2153   Current Outpatient Prescriptions  Medication Sig Dispense Refill  . aspirin 81 MG tablet Take 81 mg by mouth daily.    . Glycopyrrolate-Formoterol (BEVESPI AEROSPHERE) 9-4.8 MCG/ACT AERO Inhale 2 puffs into the lungs 2 (two) times daily. 1 Inhaler 11  . methimazole (TAPAZOLE) 5 MG tablet Take 5 mg by mouth daily.   2  . metoprolol succinate (TOPROL-XL) 25 MG 24 hr tablet Take 25 mg by mouth daily.     . OXYGEN Inhale 2  L into the lungs continuous.    . polyethylene glycol (MIRALAX / GLYCOLAX) packet Take 17 g by mouth daily.    [name] PROAIR HFA 108 (90 Base) MCG/ACT inhaler Inhale 2 puffs into the lungs every 4 (four) hours as needed for wheezing or shortness of breath.     . Fluocinonide 0.1 % CREA Use as directed  2  . sennosides-docusate sodium (SENOKOT-S) 8.6-50 MG tablet Take 1 tablet by mouth daily. Reported on 12/05/2015      REVIEW OF SYSTEMS:   Unable to obtain given clinical condition  PHYSICAL EXAM:   Vitals:   10/30/2016 2126 10/08/2016 2130 11/01/2016 2145 10/25/2016 2215  BP:  (!) 137/94 (!) 130/94 120/89  Pulse:  (!) 122 (!) 122 (!) 125  Resp:      SpO2: 100% 100% 99% 100%  Height:        GENERAL: The patient is a well-nourished female, in no acute distress. The vital signs are documented above. CARDIAC: There is a regular rate and rhythm.  VASCULAR: palpable femoral pulses PULMONARY: intubated ABDOMEN: Soft and non-tender   MUSCULOSKELETAL: There are no major deformities or cyanosis. NEUROLOGIC: No focal weakness or paresthesias are detected. SKIN: There are no ulcers or rashes noted.   STUDIES:   I have reviewed her CTA and discussed it with radiology, with the following findings: 1. Irregularity of the thoracic and abdominal aortic wall. A fusiform infrarenal abdominal aortic aneurysm measures 4.8 cm in greatest axial diameter (previously 3.7 cm on 11/01/2014). There is aneurysmal dilatation of the right common iliac artery measuring up to 2.8 cm. Crescentic thickened wall of the distal abdominal aortic aneurysm as well as thickened wall of the right common iliac artery represent mural hemorrhage. Similar findings were present on the CT of 11/01/2014 but there has been interval increase in the degree of wall thickening indicative of interval mural bleed. No extravasation of contrast outside of the confines of the aortic wall noted. There is minimal haziness of the periaortic fat.  An impending rupture is not entirely excluded. Correlation with clinical exam and vascular surgery consult is advised. No evidence of aortic dissection. 2. Apparent occlusion of the visualized proximal right superficial femoral artery. 3. No CT evidence of pulmonary embolism. Small amount of air within the main pulmonary trunk likely iatrogenic and introduced via IV injection. 4. Severe  emphysema and moderate right pneumothorax. 5. Masslike density in the right upper lobe and right perihilar region, likely radiation fibrosis/scarring. Residual or recurrent disease is not evaluated or excluded on the basis of today's exam. 6. Broad-based umbilical hernia containing loops of small bowel and a short segment of transverse colon without evidence of obstruction  ASSESSMENT and PLAN   AAA:  There has been interval progression of her AAA since her last Ct scan from 2016.  There is no evidence of rupture on CT scan.  By report, she has not complained of abdominal pain.  She nods her head this morning stating she does not have abdominal pain.  Given her current clinical picture, no intervention on her aneurysm is recommended.  I will continue to follow her while she is in the hospital.   Annamarie Major, MD [facility] [phone] Pager [phone]

## 2016-11-04 ENCOUNTER — Inpatient Hospital Stay (HOSPITAL_COMMUNITY): Payer: Medicare Other

## 2016-11-04 DIAGNOSIS — J9602 Acute respiratory failure with hypercapnia: Secondary | ICD-10-CM

## 2016-11-04 DIAGNOSIS — J9601 Acute respiratory failure with hypoxia: Secondary | ICD-10-CM

## 2016-11-04 DIAGNOSIS — R0602 Shortness of breath: Secondary | ICD-10-CM

## 2016-11-04 DIAGNOSIS — J438 Other emphysema: Secondary | ICD-10-CM

## 2016-11-04 LAB — BASIC METABOLIC PANEL
ANION GAP: 10 (ref 5–15)
BUN: 14 mg/dL (ref 6–20)
CO2: 26 mmol/L (ref 22–32)
Calcium: 8 mg/dL — ABNORMAL LOW (ref 8.9–10.3)
Chloride: 105 mmol/L (ref 101–111)
Creatinine, Ser: 0.89 mg/dL (ref 0.44–1.00)
GFR, EST NON AFRICAN AMERICAN: 60 mL/min — AB (ref >60)
Glucose, Bld: 254 mg/dL — ABNORMAL HIGH (ref 65–99)
POTASSIUM: 3.9 mmol/L (ref 3.5–5.1)
SODIUM: 141 mmol/L (ref 135–145)

## 2016-11-04 LAB — CBC
HCT: 45.7 % (ref 36.0–46.0)
Hemoglobin: 13.8 g/dL (ref 12.0–15.0)
MCH: 26.8 pg (ref 26.0–34.0)
MCHC: 30.2 g/dL (ref 30.0–36.0)
MCV: 88.9 fL (ref 78.0–100.0)
PLATELETS: 188 10*3/uL (ref 150–400)
RBC: 5.14 MIL/uL — AB (ref 3.87–5.11)
RDW: 15.6 % — ABNORMAL HIGH (ref 11.5–15.5)
WBC: 15.7 10*3/uL — AB (ref 4.0–10.5)

## 2016-11-04 LAB — GLUCOSE, CAPILLARY
GLUCOSE-CAPILLARY: 221 mg/dL — AB (ref 65–99)
GLUCOSE-CAPILLARY: 85 mg/dL (ref 65–99)
Glucose-Capillary: 128 mg/dL — ABNORMAL HIGH (ref 65–99)
Glucose-Capillary: 160 mg/dL — ABNORMAL HIGH (ref 65–99)

## 2016-11-04 LAB — POCT I-STAT 3, ART BLOOD GAS (G3+)
ACID-BASE DEFICIT: 5 mmol/L — AB (ref 0.0–2.0)
BICARBONATE: 23.3 mmol/L (ref 20.0–28.0)
O2 Saturation: 98 %
PH ART: 7.255 — AB (ref 7.350–7.450)
TCO2: 25 mmol/L (ref 0–100)
pCO2 arterial: 52.5 mmHg — ABNORMAL HIGH (ref 32.0–48.0)
pO2, Arterial: 113 mmHg — ABNORMAL HIGH (ref 83.0–108.0)

## 2016-11-04 LAB — TRIGLYCERIDES: Triglycerides: 83 mg/dL (ref <150)

## 2016-11-04 LAB — BLOOD GAS, ARTERIAL
Acid-base deficit: 2.2 mmol/L — ABNORMAL HIGH (ref 0.0–2.0)
Bicarbonate: 21.5 mmol/L (ref 20.0–28.0)
Drawn by: 398661
FIO2: 40
O2 SAT: 98.8 %
PATIENT TEMPERATURE: 98.6
PCO2 ART: 33.3 mmHg (ref 32.0–48.0)
PEEP/CPAP: 5 cmH2O
PO2 ART: 145 mmHg — AB (ref 83.0–108.0)
Pressure control: 25 cmH2O
RATE: 18 resp/min
pH, Arterial: 7.426 (ref 7.350–7.450)

## 2016-11-04 LAB — MAGNESIUM: Magnesium: 2.1 mg/dL (ref 1.7–2.4)

## 2016-11-04 LAB — LACTIC ACID, PLASMA: LACTIC ACID, VENOUS: 3.8 mmol/L — AB (ref 0.5–1.9)

## 2016-11-04 LAB — PHOSPHORUS: Phosphorus: 3.1 mg/dL (ref 2.5–4.6)

## 2016-11-04 LAB — PROCALCITONIN: PROCALCITONIN: 0.51 ng/mL

## 2016-11-04 LAB — MRSA PCR SCREENING: MRSA by PCR: NEGATIVE

## 2016-11-04 MED ORDER — ORAL CARE MOUTH RINSE
15.0000 mL | Freq: Two times a day (BID) | OROMUCOSAL | Status: DC
  Administered 2016-11-04 – 2016-11-05 (×4): 15 mL via OROMUCOSAL

## 2016-11-04 MED ORDER — SODIUM CHLORIDE 0.9 % IV BOLUS (SEPSIS)
500.0000 mL | Freq: Once | INTRAVENOUS | Status: AC
  Administered 2016-11-04: 500 mL via INTRAVENOUS

## 2016-11-04 MED ORDER — SODIUM CHLORIDE 0.9 % IV BOLUS (SEPSIS)
1000.0000 mL | Freq: Once | INTRAVENOUS | Status: AC
  Administered 2016-11-04: 1000 mL via INTRAVENOUS

## 2016-11-04 MED ORDER — INSULIN ASPART 100 UNIT/ML ~~LOC~~ SOLN
0.0000 [IU] | SUBCUTANEOUS | Status: DC
  Administered 2016-11-04: 5 [IU] via SUBCUTANEOUS
  Administered 2016-11-04: 3 [IU] via SUBCUTANEOUS
  Administered 2016-11-05 (×3): 2 [IU] via SUBCUTANEOUS
  Administered 2016-11-06: 5 [IU] via SUBCUTANEOUS
  Administered 2016-11-06: 2 [IU] via SUBCUTANEOUS
  Administered 2016-11-06 – 2016-11-07 (×7): 3 [IU] via SUBCUTANEOUS
  Administered 2016-11-07: 5 [IU] via SUBCUTANEOUS
  Administered 2016-11-08 (×2): 3 [IU] via SUBCUTANEOUS
  Administered 2016-11-08: 2 [IU] via SUBCUTANEOUS
  Administered 2016-11-08: 3 [IU] via SUBCUTANEOUS
  Administered 2016-11-09: 2 [IU] via SUBCUTANEOUS
  Administered 2016-11-09: 3 [IU] via SUBCUTANEOUS
  Administered 2016-11-09: 5 [IU] via SUBCUTANEOUS
  Administered 2016-11-09 (×2): 2 [IU] via SUBCUTANEOUS
  Administered 2016-11-09: 3 [IU] via SUBCUTANEOUS
  Administered 2016-11-10: 2 [IU] via SUBCUTANEOUS
  Administered 2016-11-10: 3 [IU] via SUBCUTANEOUS
  Administered 2016-11-10: 2 [IU] via SUBCUTANEOUS

## 2016-11-04 MED ORDER — SODIUM CHLORIDE 0.9% FLUSH: 10.0000 mL | INTRAVENOUS | Status: DC | PRN

## 2016-11-04 MED ORDER — METHIMAZOLE 5 MG PO TABS
2.5000 mg | ORAL_TABLET | Freq: Every day | ORAL | Status: DC
  Administered 2016-11-05 – 2016-11-09 (×5): 2.5 mg
  Filled 2016-11-04 (×6): qty 1

## 2016-11-04 MED ORDER — SODIUM CHLORIDE 0.9 % IV SOLN
INTRAVENOUS | Status: DC
  Administered 2016-11-07 – 2016-11-10 (×2): via INTRAVENOUS

## 2016-11-04 MED ORDER — SODIUM CHLORIDE 0.9 % IV SOLN: 250.0000 mL | INTRAVENOUS | Status: DC | PRN

## 2016-11-04 MED ORDER — CHLORHEXIDINE GLUCONATE 0.12% ORAL RINSE (MEDLINE KIT)
15.0000 mL | Freq: Two times a day (BID) | OROMUCOSAL | Status: DC
  Administered 2016-11-04: 15 mL via OROMUCOSAL

## 2016-11-04 MED ORDER — SODIUM CHLORIDE 0.9 % IV SOLN: 250.0000 mL | INTRAVENOUS | Status: DC

## 2016-11-04 MED ORDER — PROPOFOL 1000 MG/100ML IV EMUL
5.0000 ug/kg/min | INTRAVENOUS | Status: DC
  Administered 2016-11-04: 10 ug/kg/min via INTRAVENOUS
  Filled 2016-11-04: qty 100

## 2016-11-04 MED ORDER — CHLORHEXIDINE GLUCONATE CLOTH 2 % EX PADS: 6.0000 | MEDICATED_PAD | Freq: Every day | CUTANEOUS | Status: DC

## 2016-11-04 MED ORDER — ORAL CARE MOUTH RINSE
15.0000 mL | OROMUCOSAL | Status: DC
  Administered 2016-11-04: 15 mL via OROMUCOSAL

## 2016-11-04 MED ORDER — SODIUM CHLORIDE 0.9% FLUSH: 10.0000 mL | Freq: Two times a day (BID) | INTRAVENOUS | Status: DC

## 2016-11-04 NOTE — Progress Notes (Signed)
Inpatient Diabetes Program Recommendations  AACE/ADA: New Consensus Statement on Inpatient Glycemic Control (2015)  Target Ranges:  Prepandial:   less than 140 mg/dL      Peak postprandial:   less than 180 mg/dL (1-2 hours)      Critically ill patients:  140 - 180 mg/dL   Lab Results  Component Value Date   GLUCAP 90 11/01/2014   HGBA1C 7.0 (H) 01/15/2015    Review of Glycemic Control Results for FRANCISCA, LANGENDERFER (MRN [id number]) as of 11/04/2016 09:05  Ref. Range 10/14/2016 19:29 10/20/2016 20:32 11/04/2016 02:50  Glucose Latest Ref Range: 65 - 99 mg/dL 209 (H) 202 (H) 254 (H)   Diabetes history: DM2 Outpatient Diabetes medications: None listed Current orders for Inpatient glycemic control: None  Inpatient Diabetes Program Recommendations:  Noted A1c 01/15/15 7.0. Please consider Adult ICU Glycemic control orders while intubated.  Thank you, Nani Gasser. Alexzander Dolinger, RN, MSN, CDE  Diabetes Coordinator Inpatient Glycemic Control Team Team Pager [phone] (8am-5pm) 11/04/2016 9:06 AM

## 2016-11-04 NOTE — Progress Notes (Signed)
[place] Progress Note Patient Name: AMYRE SEGUNDO DOB: 1935/03/31 MRN: [id number]   Date of Service  11/04/2016  HPI/Events of Note  Hypotensive.  eICU Interventions  Will give 1 liter fluid bolus.        Jovontae Banko 11/04/2016, 1:21 AM

## 2016-11-04 NOTE — Progress Notes (Signed)
CRITICAL VALUE ALERT  Critical Value:  Lactic acid 3.8 (Previously 1.7)  Date & Time Notied:  11/04/16 0350  Provider Notified: Dr. Halford Chessman   Orders Received/Actions taken: None

## 2016-11-04 NOTE — Progress Notes (Signed)
eLink Physician-Brief Progress Note Patient Name: Susan Garrett DOB: [birth year] MRN: [id number]   Date of Service  11/04/2016  HPI/Events of Note  Camera check on patient postextubation. Sleeping in decubitus position. Currently on nasal cannula oxygen. Normal work of breathing with respiratory rate 15 breaths per minute.   eICU Interventions  Continuing close ICU monitoring postextubation.      Intervention Category Major Interventions: Respiratory failure - evaluation and management  Tera Partridge 11/04/2016, 9:55 PM

## 2016-11-05 ENCOUNTER — Inpatient Hospital Stay (HOSPITAL_COMMUNITY): Payer: Medicare Other

## 2016-11-05 DIAGNOSIS — J930 Spontaneous tension pneumothorax: Secondary | ICD-10-CM

## 2016-11-05 LAB — BLOOD GAS, ARTERIAL
Acid-Base Excess: 2.6 mmol/L — ABNORMAL HIGH (ref 0.0–2.0)
Bicarbonate: 29.2 mmol/L — ABNORMAL HIGH (ref 20.0–28.0)
Drawn by: 301361
O2 Content: 2 L/min
O2 Saturation: 95.6 %
PATIENT TEMPERATURE: 98
PO2 ART: 79.9 mmHg — AB (ref 83.0–108.0)
pCO2 arterial: 68.1 mmHg (ref 32.0–48.0)
pH, Arterial: 7.253 — ABNORMAL LOW (ref 7.350–7.450)

## 2016-11-05 LAB — POCT I-STAT 3, ART BLOOD GAS (G3+)
ACID-BASE EXCESS: 7 mmol/L — AB (ref 0.0–2.0)
Bicarbonate: 32.4 mmol/L — ABNORMAL HIGH (ref 20.0–28.0)
O2 SAT: 100 %
TCO2: 34 mmol/L (ref 0–100)
pCO2 arterial: 47.4 mmHg (ref 32.0–48.0)
pH, Arterial: 7.442 (ref 7.350–7.450)
pO2, Arterial: 491 mmHg — ABNORMAL HIGH (ref 83.0–108.0)

## 2016-11-05 LAB — CBC WITH DIFFERENTIAL/PLATELET
BASOS PCT: 0 %
Basophils Absolute: 0 10*3/uL (ref 0.0–0.1)
EOS PCT: 0 %
Eosinophils Absolute: 0 10*3/uL (ref 0.0–0.7)
HEMATOCRIT: 44.4 % (ref 36.0–46.0)
Hemoglobin: 13 g/dL (ref 12.0–15.0)
LYMPHS PCT: 6 %
Lymphs Abs: 1.4 10*3/uL (ref 0.7–4.0)
MCH: 26.4 pg (ref 26.0–34.0)
MCHC: 29.3 g/dL — ABNORMAL LOW (ref 30.0–36.0)
MCV: 90.2 fL (ref 78.0–100.0)
MONO ABS: 1.1 10*3/uL — AB (ref 0.1–1.0)
Monocytes Relative: 5 %
NEUTROS PCT: 89 %
Neutro Abs: 20.1 10*3/uL — ABNORMAL HIGH (ref 1.7–7.7)
Platelets: 193 10*3/uL (ref 150–400)
RBC: 4.92 MIL/uL (ref 3.87–5.11)
RDW: 16.4 % — AB (ref 11.5–15.5)
WBC: 22.6 10*3/uL — AB (ref 4.0–10.5)

## 2016-11-05 LAB — BASIC METABOLIC PANEL
ANION GAP: 8 (ref 5–15)
ANION GAP: 9 (ref 5–15)
BUN: 14 mg/dL (ref 6–20)
BUN: 18 mg/dL (ref 6–20)
CHLORIDE: 107 mmol/L (ref 101–111)
CO2: 28 mmol/L (ref 22–32)
CO2: 28 mmol/L (ref 22–32)
CREATININE: 0.71 mg/dL (ref 0.44–1.00)
Calcium: 8.4 mg/dL — ABNORMAL LOW (ref 8.9–10.3)
Calcium: 8.6 mg/dL — ABNORMAL LOW (ref 8.9–10.3)
Chloride: 103 mmol/L (ref 101–111)
Creatinine, Ser: 0.83 mg/dL (ref 0.44–1.00)
GFR calc Af Amer: >60 mL/min (ref >60)
GFR calc non Af Amer: >60 mL/min (ref >60)
GLUCOSE: 124 mg/dL — AB (ref 65–99)
Glucose, Bld: 88 mg/dL (ref 65–99)
POTASSIUM: 5.2 mmol/L — AB (ref 3.5–5.1)
POTASSIUM: 4.6 mmol/L (ref 3.5–5.1)
SODIUM: 143 mmol/L (ref 135–145)
Sodium: 140 mmol/L (ref 135–145)

## 2016-11-05 LAB — PROCALCITONIN: Procalcitonin: 0.46 ng/mL

## 2016-11-05 LAB — GLUCOSE, CAPILLARY
GLUCOSE-CAPILLARY: 150 mg/dL — AB (ref 65–99)
GLUCOSE-CAPILLARY: 83 mg/dL (ref 65–99)
Glucose-Capillary: 96 mg/dL (ref 65–99)
Glucose-Capillary: 122 mg/dL — ABNORMAL HIGH (ref 65–99)
Glucose-Capillary: 121 mg/dL — ABNORMAL HIGH (ref 65–99)
Glucose-Capillary: 86 mg/dL (ref 65–99)

## 2016-11-05 LAB — LACTIC ACID, PLASMA
LACTIC ACID, VENOUS: 4.1 mmol/L — AB (ref 0.5–1.9)
Lactic Acid, Venous: 1.1 mmol/L (ref 0.5–1.9)
Lactic Acid, Venous: 2.2 mmol/L (ref 0.5–1.9)

## 2016-11-05 MED ORDER — ORAL CARE MOUTH RINSE
15.0000 mL | Freq: Four times a day (QID) | OROMUCOSAL | Status: DC
  Administered 2016-11-06 (×2): 15 mL via OROMUCOSAL

## 2016-11-05 MED ORDER — SODIUM CHLORIDE 0.9 % IV BOLUS (SEPSIS)
500.0000 mL | Freq: Once | INTRAVENOUS | Status: AC
  Administered 2016-11-05: 500 mL via INTRAVENOUS

## 2016-11-05 MED ORDER — MIDAZOLAM HCL 2 MG/2ML IJ SOLN
1.0000 mg | INTRAMUSCULAR | Status: DC | PRN
  Filled 2016-11-05: qty 2

## 2016-11-05 MED ORDER — MIDAZOLAM HCL 2 MG/2ML IJ SOLN: 1.0000 mg | INTRAMUSCULAR | Status: DC | PRN

## 2016-11-05 MED ORDER — METOPROLOL TARTRATE 12.5 MG HALF TABLET
12.5000 mg | ORAL_TABLET | Freq: Two times a day (BID) | ORAL | Status: DC
  Administered 2016-11-05: 12.5 mg via ORAL
  Filled 2016-11-05: qty 1

## 2016-11-05 MED ORDER — CHLORHEXIDINE GLUCONATE 0.12% ORAL RINSE (MEDLINE KIT)
15.0000 mL | Freq: Two times a day (BID) | OROMUCOSAL | Status: DC
  Administered 2016-11-05 – 2016-11-08 (×6): 15 mL via OROMUCOSAL

## 2016-11-05 MED ORDER — MIDAZOLAM HCL 2 MG/2ML IJ SOLN
1.0000 mg | INTRAMUSCULAR | Status: DC | PRN
  Administered 2016-11-06: 1 mg via INTRAVENOUS

## 2016-11-05 MED ORDER — NOREPINEPHRINE BITARTRATE 1 MG/ML IV SOLN
0.0000 ug/min | INTRAVENOUS | Status: DC
  Administered 2016-11-05: 20 ug/min via INTRAVENOUS
  Filled 2016-11-05: qty 4

## 2016-11-05 MED ORDER — PANTOPRAZOLE SODIUM 40 MG IV SOLR
40.0000 mg | INTRAVENOUS | Status: DC
  Administered 2016-11-06 – 2016-11-11 (×6): 40 mg via INTRAVENOUS
  Filled 2016-11-05 (×6): qty 40

## 2016-11-05 MED ORDER — FENTANYL CITRATE (PF) 100 MCG/2ML IJ SOLN: 50.0000 ug | INTRAMUSCULAR | Status: DC | PRN

## 2016-11-05 MED ORDER — PANTOPRAZOLE SODIUM 40 MG PO TBEC
40.0000 mg | DELAYED_RELEASE_TABLET | Freq: Every day | ORAL | Status: DC
  Administered 2016-11-05: 40 mg via ORAL
  Filled 2016-11-05: qty 1

## 2016-11-05 MED ORDER — SODIUM CHLORIDE 0.9 % IV BOLUS (SEPSIS)
1000.0000 mL | Freq: Once | INTRAVENOUS | Status: AC
  Administered 2016-11-05: 1000 mL via INTRAVENOUS

## 2016-11-05 MED ORDER — FENTANYL CITRATE (PF) 100 MCG/2ML IJ SOLN
50.0000 ug | INTRAMUSCULAR | Status: AC | PRN
  Administered 2016-11-05 – 2016-11-06 (×3): 50 ug via INTRAVENOUS
  Filled 2016-11-05 (×3): qty 2

## 2016-11-05 MED ORDER — FENTANYL CITRATE (PF) 100 MCG/2ML IJ SOLN
INTRAMUSCULAR | Status: AC
  Filled 2016-11-05: qty 2

## 2016-11-05 MED ORDER — FENTANYL CITRATE (PF) 100 MCG/2ML IJ SOLN: 25.0000 ug | INTRAMUSCULAR | Status: DC | PRN

## 2016-11-05 MED ORDER — ORAL CARE MOUTH RINSE
15.0000 mL | OROMUCOSAL | Status: DC
  Administered 2016-11-06 – 2016-11-09 (×31): 15 mL via OROMUCOSAL

## 2016-11-05 MED ORDER — FUROSEMIDE 10 MG/ML IJ SOLN
40.0000 mg | Freq: Once | INTRAMUSCULAR | Status: AC
  Administered 2016-11-05: 40 mg via INTRAVENOUS
  Filled 2016-11-05: qty 4

## 2016-11-05 MED ORDER — SODIUM CHLORIDE 0.9 % IV BOLUS (SEPSIS): 500.0000 mL | Freq: Once | INTRAVENOUS | Status: AC

## 2016-11-05 MED ORDER — CHLORHEXIDINE GLUCONATE 0.12% ORAL RINSE (MEDLINE KIT)
15.0000 mL | Freq: Two times a day (BID) | OROMUCOSAL | Status: DC
  Administered 2016-11-05 – 2016-11-09 (×4): 15 mL via OROMUCOSAL

## 2016-11-05 MED ORDER — MIDAZOLAM HCL 2 MG/2ML IJ SOLN
INTRAMUSCULAR | Status: AC
  Administered 2016-11-05: 2 mg
  Filled 2016-11-05: qty 2

## 2016-11-05 MED ORDER — FENTANYL CITRATE (PF) 100 MCG/2ML IJ SOLN
50.0000 ug | INTRAMUSCULAR | Status: DC | PRN
  Administered 2016-11-05 – 2016-11-06 (×2): 50 ug via INTRAVENOUS
  Filled 2016-11-05 (×2): qty 2

## 2016-11-05 NOTE — Progress Notes (Signed)
CRITICAL VALUE ALERT  Critical Value:  Lactic acid 2.2  Date & Time Notied:  1915  Provider Notified: Dr. Ashok Cordia  Orders Received/Actions taken: No current orders at this time

## 2016-11-05 NOTE — Progress Notes (Signed)
Dr. Titus Mould notified of patient's increased work of breathing and tachypnea and c/o feeling like she was going to die.  Patient with expiratory wheezes noted and O2 Sats of 97%.  BP elevated at 1803/108.  10mg  Labatelol given.  Xopenex breathing treatment given for wheezing.  Louretta Parma, NP to come and see patient.

## 2016-11-05 NOTE — Progress Notes (Signed)
S:  Called to bedside to assess patient for respiratory distress. Patient was noted to by agonally breathing and was intubated by RT before PCCM arrival to bedside.    O:  Blood pressure (!) 151/94, pulse (!) 104, temperature 98 F (36.7 C), temperature source Oral, resp. rate (!) 31, height 5\' 8"  (1.727 m), weight 66.2 kg (145 lb 15.1 oz), SpO2 99 %.   General: Adult female, sedated and mechanically ventilated  Neuro: Sedated, moves extremities, pupils intact, does not follow commands  CV: Tachy, no MRG, no JVD PULM: diminished breath sounds to left, non-labored  GI: non-tender, non-distended, active bowel sounds  Extremities: SQ air noted on right side from neck to abdomen, right side chest tube in place    CBC    Component Value Date/Time   WBC 22.6 (H) 11/05/2016 0344   RBC 4.92 11/05/2016 0344   HGB 13.0 11/05/2016 0344   HGB 14.0 05/19/2016 1101   HCT 44.4 11/05/2016 0344   HCT 46.1 05/19/2016 1101   PLT 193 11/05/2016 0344   PLT 223 05/19/2016 1101   MCV 90.2 11/05/2016 0344   MCV 84.9 05/19/2016 1101   MCH 26.4 11/05/2016 0344   MCHC 29.3 (L) 11/05/2016 0344   RDW 16.4 (H) 11/05/2016 0344   RDW 14.6 (H) 05/19/2016 1101   LYMPHSABS 1.4 11/05/2016 0344   LYMPHSABS 1.9 05/19/2016 1101   MONOABS 1.1 (H) 11/05/2016 0344   MONOABS 0.6 05/19/2016 1101   EOSABS 0.0 11/05/2016 0344   EOSABS 0.1 05/19/2016 1101   BASOSABS 0.0 11/05/2016 0344   BASOSABS 0.1 05/19/2016 1101    BMET    Component Value Date/Time   NA 140 11/05/2016 1232   NA 144 05/19/2016 1101   K 4.6 11/05/2016 1232   K 4.2 05/19/2016 1101   CL 103 11/05/2016 1232   CO2 28 11/05/2016 1232   CO2 34 (H) 05/19/2016 1101   GLUCOSE 124 (H) 11/05/2016 1232   GLUCOSE 101 05/19/2016 1101   BUN 18 11/05/2016 1232   BUN 11.8 05/19/2016 1101   CREATININE 0.83 11/05/2016 1232   CREATININE 0.8 05/19/2016 1101   CALCIUM 8.6 (L) 11/05/2016 1232   CALCIUM 10.4 05/19/2016 1101   GFRNONAA >60 11/05/2016 1232   GFRAA >60 11/05/2016 1232      A:  Acute on Chronic Hypercarbic/Hypoxic Respiratory Failure +AECOPD RUL Pneumothorax   P: -Vent Management  -Maintain Oxygen Saturation >88 -Duonebs / Levalbuterol. -Solumedrol (60 BID), Ceftriaxone. -Chest Tube to suction  -CXR/ABG now  -Fentanyl/Versed PRN for sedation   [name], AGACNP-BC [place] [facility]  Pgr: [phone]  PCCM Pgr: [phone]

## 2016-11-05 NOTE — Progress Notes (Signed)
Called over to [facility] because patient deteriorating and now near agonal respirations.  O2 Sats now dropping rapidly from 92% at 1700 to 74% at 1705.  Respiratory notified and asked to come and intubate patient.  Patient now grey in color.

## 2016-11-05 NOTE — Procedures (Signed)
Intubation Procedure Note Susan Garrett [id number] [birth year]  Procedure: Intubation Indications: Respiratory insufficiency  Procedure Details Consent: Unable to obtain consent because of emergent medical necessity. Time Out: Verified patient identification, verified procedure, site/side was marked, verified correct patient position, special equipment/implants available, medications/allergies/relevent history reviewed, required imaging and test results available.  Performed  Maximum sterile technique was used including gloves and mask.  MAC and 4    Evaluation Hemodynamic Status: BP stable throughout; O2 sats: stable throughout Patient's Current Condition: stable Complications: No apparent complications Patient did tolerate procedure well. Chest X-ray ordered to verify placement.  CXR: pending. Tube placement verified with easy cap and BLB sounds.   Dimple Nanas 11/05/2016

## 2016-11-05 NOTE — Progress Notes (Signed)
  [facility] Progress Note  Subjective    Some shortness of breath. No abdominal pain.   Objective Vitals:   11/05/16 1000 11/05/16 1122  BP: 129/88   Pulse: (!) 113   Resp: (!) 24   Temp:  97.8 F (36.6 C)    Intake/Output Summary (Last 24 hours) at 11/05/16 1127 Last data filed at 11/05/16 1000  Gross per 24 hour  Intake           2221.8 ml  Output             2210 ml  Net             11.8 ml   Sitting in bed in NAD. Abdomen soft and non-tender.   Assessment/Planning: 82 y.o. female with acute respiratory failure. Asymptomatic 4.8 cm AAA.   No abdominal pain.  No plans for intervention given lack of symptoms and current clinical status.  Will continue to follow.   Alvia Grove 11/05/2016 11:27 AM --  Laboratory CBC    Component Value Date/Time   WBC 22.6 (H) 11/05/2016 0344   HGB 13.0 11/05/2016 0344   HGB 14.0 05/19/2016 1101   HCT 44.4 11/05/2016 0344   HCT 46.1 05/19/2016 1101   PLT 193 11/05/2016 0344   PLT 223 05/19/2016 1101    BMET    Component Value Date/Time   NA 143 11/05/2016 0344   NA 144 05/19/2016 1101   K 5.2 (H) 11/05/2016 0344   K 4.2 05/19/2016 1101   CL 107 11/05/2016 0344   CO2 28 11/05/2016 0344   CO2 34 (H) 05/19/2016 1101   GLUCOSE 88 11/05/2016 0344   GLUCOSE 101 05/19/2016 1101   BUN 14 11/05/2016 0344   BUN 11.8 05/19/2016 1101   CREATININE 0.71 11/05/2016 0344   CREATININE 0.8 05/19/2016 1101   CALCIUM 8.4 (L) 11/05/2016 0344   CALCIUM 10.4 05/19/2016 1101   GFRNONAA >60 11/05/2016 0344   GFRAA >60 11/05/2016 0344    COAG No results found for: INR, PROTIME No results found for: PTT  Antibiotics Anti-infectives    Start     Dose/Rate Route Frequency Ordered Stop   10/12/2016 2200  cefTRIAXone (ROCEPHIN) 2 g in dextrose 5 % 50 mL IVPB     2 g 100 mL/hr over 30 Minutes Intravenous Every 24 hours 10/09/2016 2131         Virgina Jock, PA-C [facility] Office:  [phone] Pager: [phone] 11/05/2016 11:27 AM

## 2016-11-05 NOTE — Progress Notes (Addendum)
[place] Progress Note Patient Name: NEDDIE STEEDMAN DOB: 12-Feb-1935 MRN: [id number]   Date of Service  11/05/2016  HPI/Events of Note  Camara check at bedside nurses request due to worsening respiratory status. Patient with near agonal respirations. CODE BLUE initiated. Personally paged for emergent anesthesia support as intensivist was not immediately available. Patient never lost pulses. Respiratory therapy attempted intubation once but was unsuccessful. In the midst of direct laryngoscopy patient became combative attempting to fend off respiratory therapist. Despite direction to stop respiratory therapy continued with intubation. Anesthesia at bedside at the time of insertion. Portable chest x-ray reviewed from earlier this afternoon without signs of reaccumulation of pneumothorax and chest tube reportedly already transitioned to suction prior to my Camara check.   eICU Interventions  1. Stat portable chest x-ray and post intubation ABG ordered to confirm placement 2. Intensivist notified of events to come to bedside and assess patient for possible airway trauma 3. Sedation ordered      Intervention Category Major Interventions: Respiratory failure - evaluation and management  Tera Partridge 11/05/2016, 5:17 PM

## 2016-11-05 NOTE — Progress Notes (Addendum)
Attempted to page [name] again to come and see patient.  CT back to 20cm suction due to increase in SQ Air in Rt. Chest at 1700 after CXR

## 2016-11-05 NOTE — Progress Notes (Addendum)
eLink Physician-Brief Progress Note Patient Name: Susan Garrett DOB: 1935-04-24 MRN: [id number]   Date of Service  11/05/2016  HPI/Events of Note  Lactic Acid = 2.2 >> 4.1 - Increased Lactic Acid likely d/t increased WOB and brief code. Last LVEF = 55% to 60%.  eICU Interventions  Will order: 1. Bolus with 0.9 NaCl 1 liter IV over 1 hour now.  2. Continue to trend Lactic Acid.     Intervention Category Major Interventions: Acid-Base disturbance - evaluation and management  Nathin Saran Eugene 11/05/2016, 11:17 PM

## 2016-11-05 NOTE — Progress Notes (Addendum)
PULMONARY / CRITICAL CARE MEDICINE   Name: Susan Garrett MRN: [id number] DOB: Sep 08, 1934    ADMISSION DATE:  10/28/2016 CONSULTATION DATE:  10/16/2016  REFERRING MD:  Ralene Bathe  CHIEF COMPLAINT:  SOB  Brief:   82 y.o. female with PMH of COPD on chronic 2L O2 (followed by Dr. Melvyn Novas) and  RUL lung CA s/p chemoradiation with chemotherapy in 2016 (followed by Dr. Earlie Server).  On 5/29 found in respiratory distress. EMS on arrival noted SpO2 of 85% on 2L.  En route to ED, she became unresponsive and remained so while in ED and was intubated. ABG demonstrated acute hypercarbic respiratory failure. CXR demonstrated stable cardiomegaly and atherosclerosis, along with possible small right pneumothorax.    SUBJECTIVE:   Reports Anxiety overnight. On 3L [place].   VITAL SIGNS: BP 127/88   Pulse (!) 102   Temp 97.9 F (36.6 C) (Oral)   Resp 18   Ht 5\' 8"  (1.727 m)   Wt 66.2 kg (145 lb 15.1 oz)   SpO2 100%   BMI 22.19 kg/m   HEMODYNAMICS:    VENTILATOR SETTINGS: Vent Mode: CPAP;PSV FiO2 (%):  [40 %] 40 % PEEP:  [5 cmH20] 5 cmH20 Pressure Support:  [8 cmH20] 8 cmH20  INTAKE / OUTPUT: I/O last 3 completed shifts: In: 4856.5 [P.O.:480; I.V.:1716.5; NG/GT:60; IV Piggyback:2600] Out: 9767 [Urine:1610; Emesis/NG output:5]   PHYSICAL EXAMINATION:  General appearance:  female, well nourished, mild respiratory distress Eyes: PERRL, EOMI bilaterally. Mouth:  membranes and no mucosal ulcerations; normal hard and soft palate Neck: Trachea midline; neck supple, no JVD Lungs/chest: Chest tube in place to right side. No wheeze/crackles, tachypnea  CV: RRR, no MRGs  Abdomen: Soft, non-tender; no masses or HSM Extremities: No peripheral edema or extremity lymphadenopathy Skin: Normal temperature, turgor and texture; no rash, ulcers or subcutaneous nodules Psych: Appropriate affect, alert and oriented to person, place and time  LABS:  BMET  Recent Labs Lab 10/08/2016 1929 10/21/2016 2032  11/04/16 0250 11/05/16 0344  NA 143 143 141 143  K 4.3 3.9 3.9 5.2*  CL 108 102 105 107  CO2 30  --  26 28  BUN 14 17 14 14   CREATININE 0.89 0.90 0.89 0.71  GLUCOSE 209* 202* 254* 88    Electrolytes  Recent Labs Lab 11/04/2016 1929 10/21/2016 2200 11/04/16 0250 11/05/16 0344  CALCIUM 8.4*  --  8.0* 8.4*  MG  --  2.2 2.1  --   PHOS  --  4.3 3.1  --     CBC  Recent Labs Lab 10/16/2016 1929 10/22/2016 2032 11/04/16 0250 11/05/16 0344  WBC 14.0*  --  15.7* 22.6*  HGB 12.7 15.0 13.8 13.0  HCT 43.3 44.0 45.7 44.4  PLT 186  --  188 193    Coag's No results for input(s): APTT, INR in the last 168 hours.  Sepsis Markers  Recent Labs Lab 10/31/2016 2204 10/23/2016 2207 11/04/16 0250 11/04/16 0258 11/05/16 0344  LATICACIDVEN 1.66 1.7 3.8*  --   --   PROCALCITON  --   --   --  0.51 0.46    ABG  Recent Labs Lab 11/04/16 0335 11/04/16 1047  PHART 7.426 7.255*  PCO2ART 33.3 52.5*  PO2ART 145* 113.0*    Liver Enzymes  Recent Labs Lab 10/14/2016 1929  AST 50*  ALT 28  ALKPHOS 94  BILITOT 0.8  ALBUMIN 3.0*    Cardiac Enzymes No results for input(s): TROPONINI, PROBNP in the last 168 hours.  Glucose  Recent Labs  Lab 11/04/16 0900 11/04/16 1119 11/04/16 1506 11/04/16 1920 11/05/16 0005 11/05/16 0330  GLUCAP 221* 128* 85 160* 150* 83    Imaging No results found.   STUDIES:  CXR 5/29 > stable cardiomegaly and atherosclerosis, along with possible small right pneumothorax.  CT head 5/29 > No acute. Stable chronic microvascular ischemic changes and mild parenchymal volume loss  CT chest 5/29 > PTX rt CXR 5/30 > worsening subcutaneous emphysema over the right hemithorax and base of the right neck  CULTURES: Blood 5/29 >  Sputum 5/29 >  MRSA PCR 5/29 > Negative   ANTIBIOTICS: Ceftriaxone 5/29 >   SIGNIFICANT EVENTS: 5/29 > admit.  LINES/TUBES: ETT 5/29 >   DISCUSSION: 82 y.o. female with hx O2 dependent COPD and remote RUL lung CA,  admitted 5/29 with acute hypercarbic respiratory failure and AECOPD requiring intubation.  ASSESSMENT / PLAN:  PULMONARY A: Acute hypercarbic with AoC hypoxic respiratory failure. +AECOPD RUL pneumothorax Hx RUL lung CA - s/p chemoradiation and chemotherapy 2016 (followed by Dr. Earlie Server). P:   Maintain Oxygen Saturation >88 Duonebs / Levalbuterol. Solumedrol (60 BID), Ceftriaxone. Chest Tube to water seal - repeat CXR this PM  CXR in AM.  CARDIOVASCULAR A:  H/O HTN. P:  Cardiac Monitoring  Labetalol PRN. Continue preadmission ASA. Start Metoprolol 12.5 BID  Hold preadmission toprol-xl.  RENAL A:   Lactic Acidosis  Volume Overload  P:   Trend BMP Trend Lactic acid  Lasix 40 meq x 1  D/C NS @ 75 ml/hr Replace electrolytes as needed   GASTROINTESTINAL A:   GI prophylaxis. Nutrition. P:   SUP: Pantoprazole. Continue Carb Mod diet   HEMATOLOGIC A:   VTE Prophylaxis. P:  SCD's / heparin. CBC in AM.  INFECTIOUS A:   AECOPD. P:   Trend Fever and WBC curve  Continue Ceftriaxone  Follow Culture Data   ENDOCRINE A:   Hyperthyroidism   P:   Continue methimazole.  NEUROLOGIC A:   Acute hypercarbic encephalopathy. - Resolved  P:   Monitor   Family updated: Niece updated at bedside.   Interdisciplinary Family Meeting v Palliative Care Meeting:  Due by: 11/09/16.  CC time: 32 min.  [name], AGACNP-BC [facility]  Pgr: [phone]  PCCM Pgr: [phone]  STAFF NOTE: Linwood Dibbles, MD FACP have personally reviewed patient's available data, including medical history, events of note, physical examination and test results as part of my evaluation. I have discussed with resident/NP and other care providers such as pharmacist, RN and RRT. In addition, I personally evaluated patient and elicited key findings of: awakens, tired, does follows commands, no distress, speaks in sentences easily, ronchi on rt slight  increased and distant, abdo soft, no purse lip, pcxr which I reviewed, revelaed increased rt infiltrate, left chronic changes, chest tube wnl, did well overall after extubation, now with some lethargy, obtain abg, repeat pcxr in 4 hours after water seal, limit volume, lasix to even to neg balance, pcxr in am , pct neg, pcxr likely on rt represents ALI after ptx resolution, given clinical status wil keep CTX for now, likely dc in am, K noted, may be related to mild acidosis, need IS to mobilize secretions, upright position, may advance diet to full liquids, would favor NIMV but she did not tolerate this prior 5/30, her HTN noted, add BB, as slight tachy also, no leak, no ptx- water seal The patient is critically ill with multiple organ systems failure and requires high complexity decision  making for assessment and support, frequent evaluation and titration of therapies, application of advanced monitoring technologies and extensive interpretation of multiple databases.   Critical Care Time devoted to patient care services described in this note is 30 Minutes. This time reflects time of care of this signee: Merrie Roof, MD FACP. This critical care time does not reflect procedure time, or teaching time or supervisory time of PA/NP/Med student/Med Resident etc but could involve care discussion time. Rest per NP/medical resident whose note is outlined above and that I agree with   Lavon Paganini. Titus Mould, MD, [place] Pgr: [facility] 11/05/2016 8:40 AM

## 2016-11-06 ENCOUNTER — Inpatient Hospital Stay (HOSPITAL_COMMUNITY): Payer: Medicare Other

## 2016-11-06 ENCOUNTER — Encounter (HOSPITAL_COMMUNITY): Payer: Self-pay | Admitting: Physician Assistant

## 2016-11-06 DIAGNOSIS — R000 Tachycardia, unspecified: Secondary | ICD-10-CM

## 2016-11-06 DIAGNOSIS — I479 Paroxysmal tachycardia, unspecified: Secondary | ICD-10-CM

## 2016-11-06 HISTORY — DX: Paroxysmal tachycardia, unspecified: I47.9

## 2016-11-06 LAB — BASIC METABOLIC PANEL
Anion gap: 15 (ref 5–15)
BUN: 31 mg/dL — ABNORMAL HIGH (ref 6–20)
CALCIUM: 8.5 mg/dL — AB (ref 8.9–10.3)
CO2: 22 mmol/L (ref 22–32)
CREATININE: 1.12 mg/dL — AB (ref 0.44–1.00)
Chloride: 104 mmol/L (ref 101–111)
GFR calc Af Amer: 52 mL/min — ABNORMAL LOW (ref >60)
GFR, EST NON AFRICAN AMERICAN: 45 mL/min — AB (ref >60)
GLUCOSE: 104 mg/dL — AB (ref 65–99)
Potassium: 3.7 mmol/L (ref 3.5–5.1)
Sodium: 141 mmol/L (ref 135–145)

## 2016-11-06 LAB — CBC
HCT: 42.2 % (ref 36.0–46.0)
HEMOGLOBIN: 12.9 g/dL (ref 12.0–15.0)
MCH: 26.8 pg (ref 26.0–34.0)
MCHC: 30.6 g/dL (ref 30.0–36.0)
MCV: 87.6 fL (ref 78.0–100.0)
PLATELETS: 201 10*3/uL (ref 150–400)
RBC: 4.82 MIL/uL (ref 3.87–5.11)
RDW: 15.7 % — AB (ref 11.5–15.5)
WBC: 17.5 10*3/uL — ABNORMAL HIGH (ref 4.0–10.5)

## 2016-11-06 LAB — CULTURE, RESPIRATORY W GRAM STAIN
Culture: NORMAL
Special Requests: NORMAL

## 2016-11-06 LAB — GLUCOSE, CAPILLARY
GLUCOSE-CAPILLARY: 95 mg/dL (ref 65–99)
GLUCOSE-CAPILLARY: 119 mg/dL — AB (ref 65–99)
GLUCOSE-CAPILLARY: 169 mg/dL — AB (ref 65–99)
GLUCOSE-CAPILLARY: 221 mg/dL — AB (ref 65–99)
Glucose-Capillary: 86 mg/dL (ref 65–99)
Glucose-Capillary: 123 mg/dL — ABNORMAL HIGH (ref 65–99)
Glucose-Capillary: 198 mg/dL — ABNORMAL HIGH (ref 65–99)

## 2016-11-06 LAB — MAGNESIUM
Magnesium: 2 mg/dL (ref 1.7–2.4)
Magnesium: 2.1 mg/dL (ref 1.7–2.4)
Magnesium: 2 mg/dL (ref 1.7–2.4)

## 2016-11-06 LAB — PHOSPHORUS
PHOSPHORUS: 2 mg/dL — AB (ref 2.5–4.6)
PHOSPHORUS: 2.6 mg/dL (ref 2.5–4.6)
Phosphorus: 3.3 mg/dL (ref 2.5–4.6)

## 2016-11-06 LAB — CULTURE, RESPIRATORY

## 2016-11-06 LAB — PROCALCITONIN: PROCALCITONIN: 0.69 ng/mL

## 2016-11-06 LAB — LACTIC ACID, PLASMA
LACTIC ACID, VENOUS: 2.2 mmol/L — AB (ref 0.5–1.9)
Lactic Acid, Venous: 2.5 mmol/L (ref 0.5–1.9)

## 2016-11-06 MED ORDER — FENTANYL CITRATE (PF) 100 MCG/2ML IJ SOLN
50.0000 ug | Freq: Once | INTRAMUSCULAR | Status: AC
  Administered 2016-11-06: 50 ug via INTRAVENOUS
  Filled 2016-11-06: qty 2

## 2016-11-06 MED ORDER — METHYLPREDNISOLONE SODIUM SUCC 40 MG IJ SOLR
40.0000 mg | Freq: Two times a day (BID) | INTRAMUSCULAR | Status: DC
  Administered 2016-11-06 – 2016-11-09 (×8): 40 mg via INTRAVENOUS
  Filled 2016-11-06 (×8): qty 1

## 2016-11-06 MED ORDER — DEXTROSE 5 % IV SOLN
2.0000 g | Freq: Two times a day (BID) | INTRAVENOUS | Status: DC
  Administered 2016-11-06 – 2016-11-11 (×11): 2 g via INTRAVENOUS
  Filled 2016-11-06 (×13): qty 2

## 2016-11-06 MED ORDER — METOPROLOL TARTRATE 5 MG/5ML IV SOLN
2.5000 mg | INTRAVENOUS | Status: DC | PRN
  Administered 2016-11-06 – 2016-11-07 (×5): 2.5 mg via INTRAVENOUS
  Filled 2016-11-06 (×5): qty 5

## 2016-11-06 MED ORDER — SODIUM CHLORIDE 0.9 % IV BOLUS (SEPSIS)
500.0000 mL | Freq: Once | INTRAVENOUS | Status: AC
  Administered 2016-11-06: 500 mL via INTRAVENOUS

## 2016-11-06 MED ORDER — ACETYLCYSTEINE 20 % IN SOLN
4.0000 mL | Freq: Two times a day (BID) | RESPIRATORY_TRACT | Status: DC
  Administered 2016-11-06: 4 mL via RESPIRATORY_TRACT
  Filled 2016-11-06: qty 4

## 2016-11-06 MED ORDER — POTASSIUM CHLORIDE 10 MEQ/100ML IV SOLN
10.0000 meq | INTRAVENOUS | Status: AC
  Administered 2016-11-06 (×3): 10 meq via INTRAVENOUS
  Filled 2016-11-06: qty 100

## 2016-11-06 MED ORDER — VITAL HIGH PROTEIN PO LIQD
1000.0000 mL | ORAL | Status: DC
  Administered 2016-11-06: 1000 mL

## 2016-11-06 MED ORDER — VITAL HIGH PROTEIN PO LIQD
1000.0000 mL | ORAL | Status: DC
  Administered 2016-11-07 – 2016-11-08 (×3): 1000 mL

## 2016-11-06 MED ORDER — FENTANYL BOLUS VIA INFUSION
25.0000 ug | INTRAVENOUS | Status: DC | PRN
  Administered 2016-11-07: 25 ug via INTRAVENOUS
  Filled 2016-11-06: qty 25

## 2016-11-06 MED ORDER — PRO-STAT SUGAR FREE PO LIQD
30.0000 mL | Freq: Two times a day (BID) | ORAL | Status: DC
  Administered 2016-11-06: 30 mL
  Filled 2016-11-06 (×2): qty 30

## 2016-11-06 MED ORDER — VITAL HIGH PROTEIN PO LIQD: 1000.0000 mL | ORAL | Status: DC

## 2016-11-06 MED ORDER — ACETYLCYSTEINE 20 % IN SOLN
4.0000 mL | Freq: Two times a day (BID) | RESPIRATORY_TRACT | Status: DC
  Administered 2016-11-06 – 2016-11-07 (×2): 4 mL via RESPIRATORY_TRACT
  Filled 2016-11-06 (×2): qty 4

## 2016-11-06 MED ORDER — FENTANYL 2500MCG IN NS 250ML (10MCG/ML) PREMIX INFUSION
25.0000 ug/h | INTRAVENOUS | Status: DC
  Administered 2016-11-06: 25 ug/h via INTRAVENOUS
  Administered 2016-11-07: 75 ug/h via INTRAVENOUS
  Administered 2016-11-09: 125 ug/h via INTRAVENOUS
  Filled 2016-11-06 (×3): qty 250

## 2016-11-06 NOTE — Progress Notes (Signed)
Called to Pt room by RN, she was concerned that pts et tube had slipped. Found ET tube to be at 21. This RT and RN advanced tube back to 24. No distress noted.

## 2016-11-06 NOTE — Progress Notes (Signed)
Pharmacy Antibiotic Note  Susan Garrett is a 82 y.o. female admitted on 11/04/2016, now with concern for pneumonia.  Pharmacy has been consulted for [facility] dosing.  Plan: [place] 2g IV Q12H.  Height: 5\' 8"  (172.7 cm) Weight: 148 lb 5.9 oz (67.3 kg) IBW/kg (Calculated) : 63.9  Temp (24hrs), Avg:98.2 F (36.8 C), Min:97.5 F (36.4 C), Max:99.4 F (37.4 C)   Recent Labs Lab 10/19/2016 1929 10/07/2016 2032  11/04/16 0250 11/05/16 0344 11/05/16 1232 11/05/16 1830 11/05/16 2057 11/06/16 0230  WBC 14.0*  --   --  15.7* 22.6*  --   --   --  17.5*  CREATININE 0.89 0.90  --  0.89 0.71 0.83  --   --  1.12*  LATICACIDVEN  --   --   < > 3.8*  --  1.1 2.2* 4.1* 2.5*  < > = values in this interval not displayed.  Estimated Creatinine Clearance: 40.4 mL/min (A) (by C-G formula based on SCr of 1.12 mg/dL (H)).    Allergies  Allergen Reactions  . Fish Allergy Anaphylaxis    Patient "cannot move" (also)  . Shellfish Allergy Anaphylaxis  . Latex Rash  . Tape Rash     Thank you for allowing pharmacy to be a part of this patient's care.  Wynona Neat, PharmD, BCPS  11/06/2016 6:57 AM

## 2016-11-06 NOTE — Progress Notes (Signed)
Dr. Titus Mould made aware of low urine output.  Order for NS bolus 500cc now.

## 2016-11-06 NOTE — Progress Notes (Signed)
[place] Progress Note Patient Name: Susan Garrett DOB: 10-16-1934 MRN: [id number]   Date of Service  11/06/2016  HPI/Events of Note  Lactic Acid = 4.1 >> 2.5.  eICU Interventions  Continue present management.      Intervention Category Major Interventions: Acid-Base disturbance - evaluation and management  [name] 11/06/2016, 3:24 AM

## 2016-11-06 NOTE — Progress Notes (Signed)
Initial Nutrition Assessment  DOCUMENTATION CODES:   Not applicable  INTERVENTION:   Begin TF:  -Vital High Protein @ goal of 60 ml/hr providing 1440 kcals, 127 g of protein and 1210 mL of free water. Meets 100% estimated calorie and protein needs. PEPuP initiated.    NUTRITION DIAGNOSIS:   Inadequate oral intake related to acute illness as evidenced by NPO status.  GOAL:   Patient will meet greater than or equal to 90% of their needs  MONITOR:   Vent status, Labs, Weight trends, TF tolerance, I & O's  REASON FOR ASSESSMENT:   Consult Enteral/tube feeding initiation and management  ASSESSMENT:   82 yo female admitted with acute respiratory failure with spontaneous tension PTX, AECOPD. Pt with hx of HTN, COPD, RUL lung cancer s/p XRT and chemo (2016)  Patient is currently intubated on ventilator support MV: 7.3 L/min Temp (24hrs), Avg:98.1 F (36.7 C), Min:97.1 F (36.2 C), Max:99.4 F (37.4 C)  OG tube with tip coiled in stomach R. Chest tube in place (min output), UOP 2 L in 24 hours  Unable to complete Nutrition-Focused physical exam at this time.   Labs: reviewed Meds: NS at 75 ml/hr, fentanyl, solumedrol  Diet Order:  Diet NPO time specified  Skin:  Reviewed, no issues  Last BM:  11/06/16  Height:   Ht Readings from Last 1 Encounters:  10/14/2016 5\' 8"  (1.727 m)    Weight:   Wt Readings from Last 1 Encounters:  11/06/16 148 lb 5.9 oz (67.3 kg)   BMI:  Body mass index is 22.56 kg/m.  Estimated Nutritional Needs:   Kcal:  0938 kcals  Protein:  100-134 g   Fluid:  >/= 1.6 L  EDUCATION NEEDS:   No education needs identified at this time  [place], [place], LDN [phone] Pager  [phone] Weekend/On-Call Pager

## 2016-11-06 NOTE — Progress Notes (Signed)
Pt went into what appeared to be vtach up to >200 eyes rolled in head and pt went briefly unresponsive. eLink button activated, EKG now attached to patient as she in and out of afib with a rate between 100-180. ELink made aware and cardiology paged with new onset aFib.  EKG remains attached to pt and this RN at bedside

## 2016-11-06 NOTE — Consult Note (Signed)
Cardiology Consultation   Patient ID: Susan Garrett; [id number]; 12/16/1934   Admit date: 10/10/2016 Date of Consult: 11/06/2016  Referring Provider:  CCM  Primary Care Provider: [name], MD Cardiologist: NA Electrophysiologist:  NA  Reason for Consultation: tachycardia  History of Present Illness: Susan Garrett is a 82 y.o. female who is being seen today at 3am for the evaluation of tachycardia at the request of the critical care medicine team.  It sounds like pt was admitted w/ acute on chronic hypercarbic/hypoxic resp failure due to COPD exacerbation. She apparently also has RUL and now new L PTX. Nurses say that they noticed the patient has been having runs of tachycardia, with baseline sinus tachy in the high 90s-low 100s but with occasional bursts up in the high 100s range. Pt is intubated and sedated; not clear if the tachycardia is symptomatic. It does not appear at this time to be affecting her BP or hemodynamic stability.  She has h/o HTN and it appears BP has been adequately controlled. She also has h/o hyperthyroidism; there is no recent TSH or thyroid panel since 2016.  No prior h/o afib or any other arrhythmia. Echo in 2016 showed preserved LVEF w/o significant structural or valvular pathology.  Past Medical History:  Diagnosis Date  . Callus   . Corns and callosities   . Encounter for antineoplastic chemotherapy 11/13/2014  . Encounter for antineoplastic chemotherapy 11/13/2014  . Hypertension   . Hyperthyroidism     Past Surgical History:  Procedure Laterality Date  . LESION REMOVAL Left 11/12/87  . VAGINAL HYSTERECTOMY    . VIDEO BRONCHOSCOPY Bilateral 10/18/2014   Procedure: VIDEO BRONCHOSCOPY WITH FLUORO;  Surgeon: Tanda Rockers, MD;  Location: [facility];  Service: Endoscopy;  Laterality: Bilateral;      Current Medications: . aspirin  81 mg Per Tube Daily  . chlorhexidine gluconate (MEDLINE KIT)  15 mL Mouth Rinse BID  . chlorhexidine gluconate  (MEDLINE KIT)  15 mL Mouth Rinse BID  . heparin  5,000 Units Subcutaneous Q8H  . insulin aspart  0-15 Units Subcutaneous Q4H  . ipratropium-albuterol  3 mL Nebulization Q6H  . mouth rinse  15 mL Mouth Rinse BID  . mouth rinse  15 mL Mouth Rinse QID  . mouth rinse  15 mL Mouth Rinse 10 times per day  . methimazole  2.5 mg Per Tube Daily  . methylPREDNISolone (SOLU-MEDROL) injection  60 mg Intravenous Q12H  . pantoprazole (PROTONIX) IV  40 mg Intravenous Q24H    Infused Medications: . sodium chloride 10 mL/hr at 11/05/16 2000  . cefTRIAXone (ROCEPHIN)  IV Stopped (11/05/16 2221)  . norepinephrine (LEVOPHED) Adult infusion Stopped (11/05/16 2015)    PRN Medications: fentaNYL (SUBLIMAZE) injection, fentaNYL (SUBLIMAZE) injection, labetalol, levalbuterol, midazolam, midazolam   Allergies:    Allergies  Allergen Reactions  . Fish Allergy Anaphylaxis    Patient "cannot move" (also)  . Shellfish Allergy Anaphylaxis  . Latex Rash  . Tape Rash    Social History:   The patient  reports that she quit smoking about 2 years ago. Her smoking use included Cigarettes. She has a 27.50 pack-year smoking history. She has never used smokeless tobacco. She reports that she does not drink alcohol or use drugs.    Family History:   The patient's family history includes Asthma in her father; Cancer in her brother and mother; Heart disease in her father.   ROS:  Please see the history of present illness.  ROS not  obtained pt intubated/sedated  Vital Signs: Blood pressure 129/89, pulse (!) 109, temperature 97.5 F (36.4 C), temperature source Oral, resp. rate 17, height _0  (1.727 m), weight 66.2 kg (145 lb 15.1 oz), SpO2 100 %.   PHYSICAL EXAM: General:  Well nourished, well developed, in no acute distress HEENT: normal Lymph: no adenopathy Neck: no JVD Endocrine:  No thryomegaly Vascular: No carotid bruits; FA pulses 2+ bilaterally without bruits  Cardiac:  RRR, tachy; no murmur  Lungs:   Decreased BS bilaterally Abd: soft, nontender, no hepatomegaly  Ext: no edema Musculoskeletal:  No deformities. Pt intubated/sedated Skin: warm and dry  Neuro:  Pt intubated/sedated. Moving all extremities spontaneously Psych: intubated, sedated  EKG:  Multiple EKG tracings have been obtained which show what appears to be runs of ST interspersed with SVT. HR is very high at times, and difficult to tell the exact nature of the SVT but it is irreg irreg and appears c/w MAT or possibly afib. There are definite polymorphic P waves most of the time; would think given the ongoing current pulm issues that MAT is probably the most likely etiology.  Labs: No results for input(s): CKTOTAL, CKMB, TROPONINI in the last 72 hours.  Recent Labs  10/20/2016 2012  TROPIPOC 0.00    Lab Results  Component Value Date   WBC 17.5 (H) 11/06/2016   HGB 12.9 11/06/2016   HCT 42.2 11/06/2016   MCV 87.6 11/06/2016   PLT 201 11/06/2016    Recent Labs Lab 10/23/2016 1929  11/06/16 0230  NA 143  < > 141  K 4.3  < > 3.7  CL 108  < > 104  CO2 30  < > 22  BUN 14  < > 31*  CREATININE 0.89  < > 1.12*  CALCIUM 8.4*  < > 8.5*  PROT 5.6*  --   --   BILITOT 0.8  --   --   ALKPHOS 94  --   --   ALT 28  --   --   AST 50*  --   --   GLUCOSE 209*  < > 104*  < > = values in this interval not displayed. Lab Results  Component Value Date   TRIG 83 11/04/2016   No results found for: DDIMER  Radiology/Studies:  Ct Head Wo Contrast  Result Date: 11/04/2016 CLINICAL DATA:  82 y/o  F; altered mental status. EXAM: CT HEAD WITHOUT CONTRAST TECHNIQUE: Contiguous axial images were obtained from the base of the skull through the vertex without intravenous contrast. COMPARISON:  11/16/2014 MRI of the head. FINDINGS: Brain: No evidence of acute infarction, hemorrhage, hydrocephalus, extra-axial collection or mass lesion/mass effect. Chronic lacunar infarct within the left caudate head, mild chronic microvascular ischemic  changes of white matter, and parenchymal volume loss of the brain. Vascular: Mild calcific atherosclerosis of the carotid siphons. Skull: Normal. Negative for fracture or focal lesion. Sinuses/Orbits: Left mastoid tip sclerosis compatible sequelae of chronic otomastoiditis. Visualized paranasal sinuses and mastoid air cells are otherwise normally aerated. Orbits are unremarkable. Other: None. IMPRESSION: 1. No acute intracranial abnormality identified. 2. Stable mild chronic microvascular ischemic changes and mild parenchymal volume loss of the brain given differences in technique. Electronically Signed   By: Kristine Garbe M.D.   On: 10/27/2016 21:32   Dg Chest Port 1 View  Result Date: 11/06/2016 CLINICAL DATA:  Ventilator dependent EXAM: PORTABLE CHEST 1 VIEW COMPARISON:  11/05/2016, 11/04/2016, 10/29/2016 FINDINGS: Endotracheal tube tip is approximately 15 mm superior to  the carina. Persistent large amount of subcutaneous emphysema within the bilateral supraclavicular regions and right chest wall. Right upper chest drainage catheter remains in place. The lungs are hyperinflated as before. Slight increased opacity at the right base. Linear scar or atelectasis in the right upper lobe. Probable skin fold left upper chest. IMPRESSION: 1. Endotracheal tube tip approximately 15 mm superior to carina 2. Continued large amount of subcutaneous emphysema in the right chest wall and small amount in the left supraclavicular fossa. 3. Skin fold artifact versus small left upper lateral pneumothorax, radiographic follow-up recommended 4. Slight increased right basilar atelectasis or infiltrate. Probable tiny right pleural effusion. Electronically Signed   By: Donavan Foil M.D.   On: 11/06/2016 02:34   Dg Chest Port 1 View  Result Date: 11/05/2016 CLINICAL DATA:  82 year old with small caliber catheter drainage of a right pneumothorax. Follow-up subcutaneous emphysema. EXAM: PORTABLE CHEST 1 VIEW 4:55 p.m.:  COMPARISON:  Portable chest x-ray earlier today 5:45 a.m., yesterday, 11/04/2016 and earlier, including CTA chest 10/07/2016. FINDINGS: Small caliber pigtail drainage catheter in the right pleural space. No residual pneumothorax. Extensive subcutaneous emphysema throughout the right lateral chest wall extending into both sides of the neck, unchanged since earlier today, unchanged to slightly progressive since yesterday. Emphysematous changes in the upper lobes. Airspace consolidation in the lower lobes with improved aeration since earlier today. No new abnormalities in either lung. Heart size upper normal for AP portable technique, unchanged. Pulmonary vascularity normal. IMPRESSION: 1. No residual right pneumothorax with small caliber pigtail drainage catheter in place. 2. Extensive subcutaneous emphysema involving the right lateral chest wall and both sides of the neck, not significantly changed since earlier today and perhaps slightly worse than yesterday. 3. Atelectasis and/or pneumonia in the lower lobes, with improved aeration since earlier today. No new abnormalities. Electronically Signed   By: Evangeline Dakin M.D.   On: 11/05/2016 19:51   Dg Chest Port 1 View  Result Date: 11/05/2016 CLINICAL DATA:  Intubated. EXAM: PORTABLE CHEST 1 VIEW COMPARISON:  Earlier today. FINDINGS: Interval endotracheal tube with its tip 4.5 cm above the carina. A right chest pigtail catheter remains in place with no pneumothorax seen there is there are companion rib shadows. No significant change in subcutaneous emphysema on the right and in the left neck and supraclavicular region. Normal sized heart. The lungs remain hyperexpanded with improved atelectasis at the right lung base. Stable linear density in the right mid to upper lung zone. Small left pleural effusion, decreased since the first examination today. No acute bony abnormality. IMPRESSION: 1. The endotracheal to tip 4.5 cm above the carina. This could be retracted  3 cm placed at the level of the clavicles. 2. Stable right mid upper lung zone atelectasis. 3. Improved right basilar atelectasis. 4. Small bilateral pleural effusions, improved. 5. Stable right pigtail catheter without pneumothorax. 6. Stable subcutaneous emphysema and changes of COPD. Electronically Signed   By: Claudie Revering M.D.   On: 11/05/2016 17:58   Dg Chest Port 1 View  Result Date: 11/05/2016 CLINICAL DATA:  Shortness of breath EXAM: PORTABLE CHEST 1 VIEW COMPARISON:  Nov 04, 2016 FINDINGS: Chest tube remains on the right. There is extensive subcutaneous air on the right. No pneumothorax is appreciable, however. Endotracheal tube and nasogastric tube have been removed. There is persistent patchy airspace consolidation in the right base. There is new patchy airspace opacity in the left base. Small pleural effusions remain. Heart size and pulmonary vascularity are normal. No adenopathy evident. No  appreciable bone lesions. IMPRESSION: Chest tube remains on the right, unchanged in position. There is extensive air throughout the soft tissues on the right as well as extending into the left neck region. No pneumothorax evident. New airspace opacity in the left base with stable airspace opacity in the right base. Small pleural effusions. No change in cardiac silhouette. Electronically Signed   By: Lowella Grip III M.D.   On: 11/05/2016 07:59   Dg Chest Port 1 View  Result Date: 11/04/2016 CLINICAL DATA:  Respiratory failure, intubated patient, chest tube treatment of left-sided pneumothorax. History of right upper lobe malignancy, COPD, former smoker. EXAM: PORTABLE CHEST 1 VIEW COMPARISON:  Portable chest x-ray of Nov 03, 2016. FINDINGS: There remains a large amount of subcutaneous emphysema over the right hemithorax extending into the right axilla and right neck. A definite pleural line within the right hemithorax is not observed. There is no mediastinal shift. Patchy interstitial density is present  in the right mid and lower lung. Stable right suprahilar density is present. The left lung is well-expanded and clear. There is flattening of the hemidiaphragms bilaterally. The heart and pulmonary vascularity are normal. The endotracheal tube tip lies 3.9 cm above the carina. The esophagogastric tube tip projects below the inferior margin of the image with the proximal port below the GE junction. The right PICC line tip projects over the midportion of the SVC. The small caliber pigtail catheter projects over the posterior aspects of the right sixth and seventh ribs. IMPRESSION: Worsening of subcutaneous emphysema over the right hemithorax and base of the right neck. No definite recurrent pneumothorax on the right is observed. Stable interstitial density in the right mid to lower lung and stable right suprahilar density. Underlying COPD. The support tubes are in reasonable position. Electronically Signed   By: David  Martinique M.D.   On: 11/04/2016 07:39   Dg Chest Portable 1 View  Result Date: 10/11/2016 CLINICAL DATA:  Chest tube placement for pneumothorax EXAM: PORTABLE CHEST 1 VIEW COMPARISON:  Same day chest CT FINDINGS: New right-sided chest tube is seen with tip coiled over the right upper thorax at the level of the posterior fifth and sixth ribs. Re-expansion of the pneumothorax is noted. Endotracheal tube tip is in satisfactory position 4.5 cm above the carina. Gastric tube with side port is seen in the expected location of the stomach. Heart is normal in size. There is aortic atherosclerosis. Streaky parenchymal opacity in the right upper lobe is seen, nonspecific in etiology. Minimal subcutaneous emphysema adjacent to the chest tube insertion site. IMPRESSION: 1. Re-expansion of right-sided pneumothorax status post chest placement. 2. Satisfactory support line and tube positions. 3. Streaky right upper lobe parenchymal opacities are seen, nonspecific in etiology some which may represent areas of  atelectasis and/or scarring. This may mask underlying pulmonary lesions. Electronically Signed   By: Ashley Royalty M.D.   On: 10/06/2016 23:21   Dg Chest Port 1 View  Result Date: 10/27/2016 CLINICAL DATA:  82 y/o  F; shortness of breath. EXAM: PORTABLE CHEST 1 VIEW COMPARISON:  09/25/2015 chest radiograph. FINDINGS: Stable cardiomegaly. Emphysema and right mid lung zone scarring. Small right pneumothorax. Chronic blunting of left costal diaphragmatic angle. Aortic atherosclerosis with calcification. Enteric tube tip below the field of view. Endotracheal tube tip 3.7 cm from carina on 7 image after readjustment. IMPRESSION: 1. Small right pneumothorax. 2. Stable cardiomegaly and aortic atherosclerosis. 3. Endotracheal tube 3.7 cm from carina. Enteric tube tip below the field of view and abdomen.  These results were called by telephone at the time of interpretation on 10/28/2016 at 7:55 pm to Dr. Quintella Reichert , who verbally acknowledged these results. Electronically Signed   By: Kristine Garbe M.D.   On: 10/09/2016 19:56   Ct Angio Chest/abd/pel For Dissection W And/or Wo Contrast  Result Date: 10/09/2016 CLINICAL DATA:  82 year old female with respiratory distress. EXAM: CT ANGIOGRAPHY CHEST, ABDOMEN AND PELVIS TECHNIQUE: Multidetector CT imaging through the chest, abdomen and pelvis was performed using the standard protocol during bolus administration of intravenous contrast. Multiplanar reconstructed images and MIPs were obtained and reviewed to evaluate the vascular anatomy. CONTRAST:  100 cc Isovue 370 COMPARISON:  Chest radiograph dated 11/02/2016 FINDINGS: CTA CHEST FINDINGS Cardiovascular: There is no cardiomegaly or pericardial effusion. There is irregularity of the wall of the thoracic aorta with noncalcified plaques. Multiple penetrating ulcers noted in the distal descending thoracic aorta. The descending thoracic aorta measures up to 3.7 cm in diameter. There is no dissection of the  thoracic aorta. No extravasation of contrast or periaortic hematoma. The origins of the great vessels of the aortic arch appear patent. There is dilatation of the main pulmonary trunk indicative of underlying pulmonary hypertension. Small amount of within the main pulmonary artery most likely iatrogenic and introduced via injection. There is no CT evidence of pulmonary embolism. Mediastinum/Nodes: Right hilar density appears similar to prior study and likely represent postsurgical changes/scarring. No definite hilar or mediastinal adenopathy identified. An enteric tube noted in the esophagus extending to the body of the stomach. Multiple left thyroid hypodense nodules noted measuring up to 12 mm. Lungs/Pleura: There is a moderate size right-sided pneumothorax. There is severe emphysema. There is a 3.0 x 3.8 cm masslike density in the right upper lobe in the region of the previously seen mass on the PET-CT of 11/01/2014. This may represent postsurgical changes or post radiation fibrosis. This area appears more masslike compared to the prior CT of 05/19/2016 likely secondary to pneumothorax and partial atelectasis of the right upper lobe. However, recurrent or residual tumor is not evaluated or excluded on the basis of the CT. There is no pleural effusion. An endotracheal tube is noted with tip above the carina. There is high-grade narrowing of the right upper lobe bronchus. The remainder of the central airways are patent. Musculoskeletal: There is degenerative changes of the spine. No acute osseous pathology. Review of the MIP images confirms the above findings. CTA ABDOMEN AND PELVIS FINDINGS VASCULAR Aorta: There is diffuse irregularity of the the aortic wall. There is a fusiform 4.8 cm infrarenal abdominal aortic aneurysm which has increased in size compared to the CT of 11/01/2014 when it measured 3.7 cm. There is crescentic thickening of the wall of the distal aorta extending to the aortic bifurcation and right  common iliac artery. This has progressed compared to the prior CT and suggests interval mural hemorrhage. There is mild haziness of the periaortic fat. Impending rupture is not entirely excluded. Correlation with clinical exam and vascular surgery consult is advised. There is no extravasation of intravenous contrast outside of the confines of the aorta. Celiac: Patent without evidence of aneurysm, dissection, vasculitis or significant stenosis. SMA: Patent without evidence of aneurysm, dissection, vasculitis or significant stenosis. Renals: Both renal arteries are patent without evidence of aneurysm, dissection, vasculitis, fibromuscular dysplasia or significant stenosis. IMA: The IMA appears patent although with diminished flow. Inflow: The right common iliac artery measures 2.8 cm in diameter. There is diffuse thickening of the wall of the right  common iliac artery, increased since the prior CT. The internal and external iliac arteries remain patent however there appears to be decreased flow in there right iliac artery is compared to the left. The visualized right superficial femoral artery appears occluded. Veins: No obvious venous abnormality within the limitations of this arterial phase study. Review of the MIP images confirms the above findings. NON-VASCULAR Hepatobiliary: There is slight irregularity of the hepatic contour which may represent early changes of cirrhosis. Clinical correlation is recommended. Small scattered foci of enhancement in the left lobe of the liver are not well characterized but may represent areas of portal venous shunting or tiny flash filling hemangiomas. There is no intrahepatic biliary ductal dilatation. The gallbladder is unremarkable. Pancreas: Unremarkable. No pancreatic ductal dilatation or surrounding inflammatory changes. Spleen: Normal in size without focal abnormality. Adrenals/Urinary Tract: The adrenal glands, kidneys, visualized ureters, and urinary bladder appear  unremarkable. Stomach/Bowel: Small scattered sigmoid diverticula without active inflammatory changes. There is a broad-based umbilical hernia containing a short segment of the transverse colon as well as a loop of small bowel. No evidence of obstruction at this level. There is no evidence of bowel obstruction or active inflammation. The appendix is not visualized with certainty. No inflammatory changes identified in the right lower quadrant. Lymphatic: No adenopathy. Reproductive: Hysterectomy.  No pelvic mass. Other: Broad-based umbilical hernia containing a loop of small bowel and a short segment of colon without evidence of obstruction. Musculoskeletal: Osteopenia with degenerative changes of the spine. No acute fracture. Review of the MIP images confirms the above findings. IMPRESSION: 1. Irregularity of the thoracic and abdominal aortic wall. A fusiform infrarenal abdominal aortic aneurysm measures 4.8 cm in greatest axial diameter (previously 3.7 cm on 11/01/2014). There is aneurysmal dilatation of the right common iliac artery measuring up to 2.8 cm. Crescentic thickened wall of the distal abdominal aortic aneurysm as well as thickened wall of the right common iliac artery represent mural hemorrhage. Similar findings were present on the CT of 11/01/2014 but there has been interval increase in the degree of wall thickening indicative of interval mural bleed. No extravasation of contrast outside of the confines of the aortic wall noted. There is minimal haziness of the periaortic fat. An impending rupture is not entirely excluded. Correlation with clinical exam and vascular surgery consult is advised. No evidence of aortic dissection. 2. Apparent occlusion of the visualized proximal right superficial femoral artery. 3. No CT evidence of pulmonary embolism. Small amount of air within the main pulmonary trunk likely iatrogenic and introduced via IV injection. 4. Severe emphysema and moderate right pneumothorax.  5. Masslike density in the right upper lobe and right perihilar region, likely radiation fibrosis/scarring. Residual or recurrent disease is not evaluated or excluded on the basis of today's exam. 6. Broad-based umbilical hernia containing loops of small bowel and a short segment of transverse colon without evidence of obstruction. These results were called by telephone at the time of interpretation on 10/13/2016 at 9:37 pm to Dr. Quintella Reichert , who verbally acknowledged these results. Electronically Signed   By: Anner Crete M.D.   On: 10/28/2016 22:14   ECHO 01-16-15  Left ventricle: The cavity size was normal. Wall thickness was   normal. Systolic function was normal. The estimated ejection   fraction was in the range of 55% to 60%. Wall motion was normal;   there were no regional wall motion abnormalities. - Mitral valve: Calcified annulus. - Right ventricle: The cavity size was mildly dilated. - Right  atrium: The atrium was mildly dilated  ASSESSMENT AND PLAN:  1. Tachycardia: predominant rhythm in looking back appears to be sinus tachycardia in the high 90s-low 100s. The arrhythmia is occurring in short bursts (no sustained arrhythmia as of yet) and probably most c/w MAT which will be difficult to control until the underlying etiology is fixed. BB or CCB could be given in attempt to slow it down if necessary (if the arrhythmia becomes sustained, or BP drops or pt becomes HD unstable). I would approach this with caution as there are documented HR in the 40s and 50s as recently as earlier last night. MAT as aforementioned is a reactive arrhythmia and is best controlled when the driving etiology is treated. I think afib is less likely but still possible. I would recommend continuing to monitor; if there is obvious afib, OAC could be added (if not contraindicated by comorbid issues) to reduce the risk of CVA. Will get repeat TTE.  2. COPD/emphysema/PTX: mgmt as per CCM  3. Hypothyroidism: as  per hx in the chart. Would check thyroid panel  Will follow. Call w/ ?'s Signed, Rudean Curt, MD  11/06/2016 3:09 AM

## 2016-11-06 NOTE — Progress Notes (Signed)
eLink paged regarding pts heart rate, lack of urine output, and overall concern regarding pts condition. No new orders, drawing morning labs early to check electrolytes. MD aware of pts condition and has reviewed heart rhythm to be sinus tach no new orders given.  RT came to room, advanced tube to 24 at the lip, chest xray called to please come complete morning portable now.  Will continue to monitor

## 2016-11-06 NOTE — Progress Notes (Signed)
CRITICAL VALUE ALERT  Critical Value:  Lactic acid 2.2  Date & Time Notied:  0930  Provider Notified: [name], NP  Orders Received/Actions taken: None at this time

## 2016-11-06 NOTE — Progress Notes (Signed)
PULMONARY / CRITICAL CARE MEDICINE   Name: Susan Garrett MRN: [id number] DOB: 1935-03-18    ADMISSION DATE:  10/18/2016 CONSULTATION DATE:  10/25/2016  REFERRING MD:  Ralene Bathe  CHIEF COMPLAINT:  SOB  Brief:   82 y.o. female with PMH of COPD on chronic 2L O2 (followed by Dr. Melvyn Novas) and  RUL lung CA s/p chemoradiation with chemotherapy in 2016 (followed by Dr. Earlie Server).  On 5/29 found in respiratory distress. EMS on arrival noted SpO2 of 85% on 2L.  En route to ED, she became unresponsive and remained so while in ED and was intubated. ABG demonstrated acute hypercarbic respiratory failure. CXR demonstrated stable cardiomegaly and atherosclerosis, along with possible small right pneumothorax.    SUBJECTIVE:   Intubated suddenly yesterday  Neg 632  VITAL SIGNS: BP (!) 128/99   Pulse (!) 113   Temp 99.4 F (37.4 C) (Oral)   Resp 17   Ht 5\' 8"  (1.727 m)   Wt 66.2 kg (145 lb 15.1 oz)   SpO2 98%   BMI 22.19 kg/m   HEMODYNAMICS:    VENTILATOR SETTINGS: Vent Mode: PRVC FiO2 (%):  [40 %-100 %] 40 % Set Rate:  [18 bmp-1818 bmp] 1818 bmp Vt Set:  [500 mL] 500 mL PEEP:  [5 cmH20] 5 cmH20 Plateau Pressure:  [24 cmH20] 24 cmH20  INTAKE / OUTPUT: I/O last 3 completed shifts: In: 3016.2 [P.O.:600; I.V.:1816.2; IV Piggyback:600] Out: 2810 [Urine:2760; Chest Tube:50]   PHYSICAL EXAMINATION: General: awake on vent, waving to me Neuro: rass 1, moves all ext equally HEENT: jvd wnl, ett PULM: reduced, slight more coarse rt, sub q h air right about same CV: s1 s2 rri, had some MAT GI: soft, bs wnl, no r Extremities: no edema  LABS:  BMET  Recent Labs Lab 11/05/16 0344 11/05/16 1232 11/06/16 0230  NA 143 140 141  K 5.2* 4.6 3.7  CL 107 103 104  CO2 28 28 22   BUN 14 18 31*  CREATININE 0.71 0.83 1.12*  GLUCOSE 88 124* 104*    Electrolytes  Recent Labs Lab 10/22/2016 2200 11/04/16 0250 11/05/16 0344 11/05/16 1232 11/06/16 0230  CALCIUM  --  8.0* 8.4* 8.6* 8.5*  MG 2.2  2.1  --   --  2.0  PHOS 4.3 3.1  --   --  2.0*    CBC  Recent Labs Lab 11/04/16 0250 11/05/16 0344 11/06/16 0230  WBC 15.7* 22.6* 17.5*  HGB 13.8 13.0 12.9  HCT 45.7 44.4 42.2  PLT 188 193 201    Coag's No results for input(s): APTT, INR in the last 168 hours.  Sepsis Markers  Recent Labs Lab 11/04/16 0258 11/05/16 0344  11/05/16 1830 11/05/16 2057 11/06/16 0230  LATICACIDVEN  --   --   < > 2.2* 4.1* 2.5*  PROCALCITON 0.51 0.46  --   --   --  0.69  < > = values in this interval not displayed.  ABG  Recent Labs Lab 11/04/16 1047 11/05/16 0917 11/05/16 1833  PHART 7.255* 7.253* 7.442  PCO2ART 52.5* 68.1* 47.4  PO2ART 113.0* 79.9* 491.0*    Liver Enzymes  Recent Labs Lab 10/14/2016 1929  AST 50*  ALT 28  ALKPHOS 94  BILITOT 0.8  ALBUMIN 3.0*    Cardiac Enzymes No results for input(s): TROPONINI, PROBNP in the last 168 hours.  Glucose  Recent Labs Lab 11/05/16 0330 11/05/16 0727 11/05/16 1120 11/05/16 1530 11/05/16 1920 11/06/16 0336  GLUCAP 83 96 122* 121* 86 95  Imaging Dg Chest Port 1 View  Result Date: 11/06/2016 CLINICAL DATA:  Ventilator dependent EXAM: PORTABLE CHEST 1 VIEW COMPARISON:  11/05/2016, 11/04/2016, 10/20/2016 FINDINGS: Endotracheal tube tip is approximately 15 mm superior to the carina. Persistent large amount of subcutaneous emphysema within the bilateral supraclavicular regions and right chest wall. Right upper chest drainage catheter remains in place. The lungs are hyperinflated as before. Slight increased opacity at the right base. Linear scar or atelectasis in the right upper lobe. Probable skin fold left upper chest. IMPRESSION: 1. Endotracheal tube tip approximately 15 mm superior to carina 2. Continued large amount of subcutaneous emphysema in the right chest wall and small amount in the left supraclavicular fossa. 3. Skin fold artifact versus small left upper lateral pneumothorax, radiographic follow-up recommended 4.  Slight increased right basilar atelectasis or infiltrate. Probable tiny right pleural effusion. Electronically Signed   By: Donavan Foil M.D.   On: 11/06/2016 02:34   Dg Chest Port 1 View  Result Date: 11/05/2016 CLINICAL DATA:  82 year old with small caliber catheter drainage of a right pneumothorax. Follow-up subcutaneous emphysema. EXAM: PORTABLE CHEST 1 VIEW 4:55 p.m.: COMPARISON:  Portable chest x-ray earlier today 5:45 a.m., yesterday, 11/05/2016 and earlier, including CTA chest 10/28/2016. FINDINGS: Small caliber pigtail drainage catheter in the right pleural space. No residual pneumothorax. Extensive subcutaneous emphysema throughout the right lateral chest wall extending into both sides of the neck, unchanged since earlier today, unchanged to slightly progressive since yesterday. Emphysematous changes in the upper lobes. Airspace consolidation in the lower lobes with improved aeration since earlier today. No new abnormalities in either lung. Heart size upper normal for AP portable technique, unchanged. Pulmonary vascularity normal. IMPRESSION: 1. No residual right pneumothorax with small caliber pigtail drainage catheter in place. 2. Extensive subcutaneous emphysema involving the right lateral chest wall and both sides of the neck, not significantly changed since earlier today and perhaps slightly worse than yesterday. 3. Atelectasis and/or pneumonia in the lower lobes, with improved aeration since earlier today. No new abnormalities. Electronically Signed   By: Evangeline Dakin M.D.   On: 11/05/2016 19:51   Dg Chest Port 1 View  Result Date: 11/05/2016 CLINICAL DATA:  Intubated. EXAM: PORTABLE CHEST 1 VIEW COMPARISON:  Earlier today. FINDINGS: Interval endotracheal tube with its tip 4.5 cm above the carina. A right chest pigtail catheter remains in place with no pneumothorax seen there is there are companion rib shadows. No significant change in subcutaneous emphysema on the right and in the left  neck and supraclavicular region. Normal sized heart. The lungs remain hyperexpanded with improved atelectasis at the right lung base. Stable linear density in the right mid to upper lung zone. Small left pleural effusion, decreased since the first examination today. No acute bony abnormality. IMPRESSION: 1. The endotracheal to tip 4.5 cm above the carina. This could be retracted 3 cm placed at the level of the clavicles. 2. Stable right mid upper lung zone atelectasis. 3. Improved right basilar atelectasis. 4. Small bilateral pleural effusions, improved. 5. Stable right pigtail catheter without pneumothorax. 6. Stable subcutaneous emphysema and changes of COPD. Electronically Signed   By: Claudie Revering M.D.   On: 11/05/2016 17:58     STUDIES:  CXR 5/29 > stable cardiomegaly and atherosclerosis, along with possible small right pneumothorax.  CT head 5/29 > No acute. Stable chronic microvascular ischemic changes and mild parenchymal volume loss  CT chest 5/29 > PTX rt CXR 5/30 > worsening subcutaneous emphysema over the right hemithorax and base  of the right neck  CULTURES: Blood 5/29 >  Sputum 5/29 >  MRSA PCR 5/29 > Negative   ANTIBIOTICS: Ceftriaxone 5/29 >   SIGNIFICANT EVENTS: 5/29 > admit. 5/30 extubated 5/31- re intubated sudden  LINES/TUBES: ETT 5/29 > 5/30 ETT 5/31>>>  DISCUSSION: 82 y.o. female with hx O2 dependent COPD and remote RUL lung CA, admitted 5/29 with acute hypercarbic respiratory failure and AECOPD requiring intubation.  ASSESSMENT / PLAN:  PULMONARY A: Acute hypercarbic with AoC hypoxic respiratory failure. +AECOPD RUL pneumothorax Hx RUL lung CA - s/p chemoradiation and chemotherapy 2016 (followed by Dr. Earlie Server). Sudden declines requiring ETT 5/31 ( etiology not clear, ATX?, mucous plug?) R/o new left PTX 6/1 P:   Maintain Oxygen Saturation >88 Duonebs / Levalbuterol. Solumedrol (60 BID), Ceftriaxone maintain Peep to 3 Repeat pcxr NOW for left ptx  concerns Keep rt chest tube to suction, although repeat pcxr did not show ptx rt Add mucomysts and observe for bronchospasm , I feel atx / mucous likley was cause May need repeat CT assessment of ptx ? left  CARDIOVASCULAR A:  H/O HTN. P:  Cardiac Monitoring  Labetalol PRN. Continue preadmission ASA. Metoprolol 12.5 BID  Hold preadmission toprol-xl.  RENAL A:   Lactic Acidosis resolving  Overdiuresis P:   Trend BMP in am  Add maint fluids Dc lasix  GASTROINTESTINAL A:   GI prophylaxis. Nutrition. P:   SUP: Pantoprazole. Restart TF  After CT  HEMATOLOGIC A:   VTE Prophylaxis, leukocytosis P:  SCD's / heparin. CBC in AM  INFECTIOUS A:   AECOPD R./o PNA rt P:   Change ctx to ceftaz Re assess sputum If spike  ENDOCRINE A:   Hyperthyroidism   P:   Continue methimazole.  NEUROLOGIC A:   Acute hypercarbic encephalopathy. - Resolved  aggitation P:   Monitor off versed, high risk delirium Add fent drip WUA  Family updated: Niece updated at bedside by me 6/1  Interdisciplinary Family Meeting v Palliative Care Meeting:  Due by: 11/09/16.  Ccm time 40 min    Lavon Paganini. Titus Mould, MD, [place] Pgr: [facility] 11/06/2016 6:31 AM

## 2016-11-06 NOTE — Progress Notes (Signed)
eLink MD paged with results of chest xray, advised to have RT pull ETT back by 2 sonometers and put in order for repeat chest xray.  Will continue to monitor

## 2016-11-06 NOTE — Progress Notes (Signed)
ET tube appears to be slightly farther out than initial assessment. RT paged to room to assess

## 2016-11-06 NOTE — Progress Notes (Signed)
Progress Note  Patient Name: Susan Garrett Date of Encounter: 11/06/2016  Primary Cardiologist: New>>Dr Cresencia Asmus  Patient Profile     82 y.o. female w/ hx HTN, Hypothyroid, COPD Gold on home O2, RUL lung CA s/p XRT and chemo was admitted 05/29 w/ acute on chronic resp failure, R PTX>>ETT, also encephalopathy. Cards saw 06/01 for episodic tachycardia in the setting of worsening lactic acidosis. Felt MAT w/ PAF less likely, some brief NSVT  Subjective   Pt awake on the vent, breathing better, no CP  Inpatient Medications    Scheduled Meds: . acetylcysteine  4 mL Nebulization BID  . aspirin  81 mg Per Tube Daily  . chlorhexidine gluconate (MEDLINE KIT)  15 mL Mouth Rinse BID  . chlorhexidine gluconate (MEDLINE KIT)  15 mL Mouth Rinse BID  . feeding supplement (PRO-STAT SUGAR FREE 64)  30 mL Per Tube BID  . feeding supplement (VITAL HIGH PROTEIN)  1,000 mL Per Tube Q24H  . heparin  5,000 Units Subcutaneous Q8H  . insulin aspart  0-15 Units Subcutaneous Q4H  . ipratropium-albuterol  3 mL Nebulization Q6H  . mouth rinse  15 mL Mouth Rinse BID  . mouth rinse  15 mL Mouth Rinse QID  . mouth rinse  15 mL Mouth Rinse 10 times per day  . methimazole  2.5 mg Per Tube Daily  . methylPREDNISolone (SOLU-MEDROL) injection  40 mg Intravenous Q12H  . pantoprazole (PROTONIX) IV  40 mg Intravenous Q24H   Continuous Infusions: . sodium chloride 10 mL/hr at 11/05/16 2000  . cefTAZidime (FORTAZ)  IV 2 g (11/06/16 0759)  . fentaNYL infusion INTRAVENOUS     PRN Meds: fentaNYL, fentaNYL (SUBLIMAZE) injection, labetalol, levalbuterol   Vital Signs    Vitals:   11/06/16 0630 11/06/16 0700 11/06/16 0730 11/06/16 0745  BP: (!) 116/103 (!) 120/104 (!) 137/103   Pulse: 95 (!) 109 89   Resp: '16 16 16   '$ Temp:      TempSrc:      SpO2: 100% 100% 100% 100%  Weight:      Height:        Intake/Output Summary (Last 24 hours) at 11/06/16 0814 Last data filed at 11/06/16 0600  Gross per 24 hour    Intake          1419.43 ml  Output             2215 ml  Net          -795.57 ml   Filed Weights   11/04/16 0115 11/05/16 0600 11/06/16 0500  Weight: 144 lb 2.9 oz (65.4 kg) 145 lb 15.1 oz (66.2 kg) 148 lb 5.9 oz (67.3 kg)    Telemetry    ST w/ episodes of narrow complex tachycardia < 10 sec overnight, several VT salvos - Personally Reviewed  ECG    ST, HR 154 - Personally Reviewed  Physical Exam   General: Well developed, well nourished, female awake on the vent, tolerating well. Head: Normocephalic, atraumatic.  Neck: Supple without bruits, JVD not seen elevated, difficult to assess 2nd equipment and lines. Lungs:  Resp regular and unlabored, bilateral rales. Heart: RRR, S1, S2, no S3, S4, or murmur; no rub. Abdomen: Soft, non-tender, non-distended with normoactive bowel sounds. No hepatomegaly. No rebound/guarding. No obvious abdominal masses. Extremities: No clubbing, cyanosis, no edema. Distal pedal pulses are 2+ bilaterally. Neuro: Alert and oriented X 2. Moves upper extremities spontaneously.  Labs    Hematology  Recent Labs Lab 11/04/16  0250 11/05/16 0344 11/06/16 0230  WBC 15.7* 22.6* 17.5*  RBC 5.14* 4.92 4.82  HGB 13.8 13.0 12.9  HCT 45.7 44.4 42.2  MCV 88.9 90.2 87.6  MCH 26.8 26.4 26.8  MCHC 30.2 29.3* 30.6  RDW 15.6* 16.4* 15.7*  PLT 188 193 201    Chemistry Recent Labs Lab 10/18/2016 1929  11/05/16 0344 11/05/16 1232 11/06/16 0230  NA 143  < > 143 140 141  K 4.3  < > 5.2* 4.6 3.7  CL 108  < > 107 103 104  CO2 30  < > '28 28 22  '$ GLUCOSE 209*  < > 88 124* 104*  BUN 14  < > 14 18 31*  CREATININE 0.89  < > 0.71 0.83 1.12*  CALCIUM 8.4*  < > 8.4* 8.6* 8.5*  PROT 5.6*  --   --   --   --   ALBUMIN 3.0*  --   --   --   --   AST 50*  --   --   --   --   ALT 28  --   --   --   --   ALKPHOS 94  --   --   --   --   BILITOT 0.8  --   --   --   --   GFRNONAA 60*  < > >60 >60 45*  GFRAA >60  < > >60 >60 52*  ANIONGAP 5  < > '8 9 15  '$ < > = values in  this interval not displayed.   Cardiac Enzymes   Recent Labs Lab 10/10/2016 2012  TROPIPOC 0.00     BNP  Recent Labs Lab 10/19/2016 1929  BNP 106.8*     Radiology    Ct Head Wo Contrast Result Date: 11/04/2016 CLINICAL DATA:  82 y/o  F; altered mental status. EXAM: CT HEAD WITHOUT CONTRAST TECHNIQUE: Contiguous axial images were obtained from the base of the skull through the vertex without intravenous contrast. COMPARISON:  11/16/2014 MRI of the head. FINDINGS: Brain: No evidence of acute infarction, hemorrhage, hydrocephalus, extra-axial collection or mass lesion/mass effect. Chronic lacunar infarct within the left caudate head, mild chronic microvascular ischemic changes of white matter, and parenchymal volume loss of the brain. Vascular: Mild calcific atherosclerosis of the carotid siphons. Skull: Normal. Negative for fracture or focal lesion. Sinuses/Orbits: Left mastoid tip sclerosis compatible sequelae of chronic otomastoiditis. Visualized paranasal sinuses and mastoid air cells are otherwise normally aerated. Orbits are unremarkable. Other: None. IMPRESSION: 1. No acute intracranial abnormality identified. 2. Stable mild chronic microvascular ischemic changes and mild parenchymal volume loss of the brain given differences in technique. Electronically Signed   By: Kristine Garbe M.D.   On: 10/12/2016 21:32   Dg Chest Port 1 View Result Date: 11/06/2016 CLINICAL DATA:  Ventilator dependent EXAM: PORTABLE CHEST 1 VIEW COMPARISON:  11/05/2016, 11/04/2016, 10/30/2016 FINDINGS: Endotracheal tube tip is approximately 15 mm superior to the carina. Persistent large amount of subcutaneous emphysema within the bilateral supraclavicular regions and right chest wall. Right upper chest drainage catheter remains in place. The lungs are hyperinflated as before. Slight increased opacity at the right base. Linear scar or atelectasis in the right upper lobe. Probable skin fold left upper chest.  IMPRESSION: 1. Endotracheal tube tip approximately 15 mm superior to carina 2. Continued large amount of subcutaneous emphysema in the right chest wall and small amount in the left supraclavicular fossa. 3. Skin fold artifact versus small left upper lateral pneumothorax, radiographic  follow-up recommended 4. Slight increased right basilar atelectasis or infiltrate. Probable tiny right pleural effusion. Electronically Signed   By: Donavan Foil M.D.   On: 11/06/2016 02:34   Dg Chest Port 1 View Result Date: 11/05/2016 CLINICAL DATA:  82 year old with small caliber catheter drainage of a right pneumothorax. Follow-up subcutaneous emphysema. EXAM: PORTABLE CHEST 1 VIEW 4:55 p.m.: COMPARISON:  Portable chest x-ray earlier today 5:45 a.m., yesterday, 10/31/2016 and earlier, including CTA chest 10/22/2016. FINDINGS: Small caliber pigtail drainage catheter in the right pleural space. No residual pneumothorax. Extensive subcutaneous emphysema throughout the right lateral chest wall extending into both sides of the neck, unchanged since earlier today, unchanged to slightly progressive since yesterday. Emphysematous changes in the upper lobes. Airspace consolidation in the lower lobes with improved aeration since earlier today. No new abnormalities in either lung. Heart size upper normal for AP portable technique, unchanged. Pulmonary vascularity normal. IMPRESSION: 1. No residual right pneumothorax with small caliber pigtail drainage catheter in place. 2. Extensive subcutaneous emphysema involving the right lateral chest wall and both sides of the neck, not significantly changed since earlier today and perhaps slightly worse than yesterday. 3. Atelectasis and/or pneumonia in the lower lobes, with improved aeration since earlier today. No new abnormalities. Electronically Signed   By: Evangeline Dakin M.D.   On: 11/05/2016 19:51   Dg Chest Port 1 View Result Date: 11/05/2016 CLINICAL DATA:  Intubated. EXAM: PORTABLE  CHEST 1 VIEW COMPARISON:  Earlier today. FINDINGS: Interval endotracheal tube with its tip 4.5 cm above the carina. A right chest pigtail catheter remains in place with no pneumothorax seen there is there are companion rib shadows. No significant change in subcutaneous emphysema on the right and in the left neck and supraclavicular region. Normal sized heart. The lungs remain hyperexpanded with improved atelectasis at the right lung base. Stable linear density in the right mid to upper lung zone. Small left pleural effusion, decreased since the first examination today. No acute bony abnormality. IMPRESSION: 1. The endotracheal to tip 4.5 cm above the carina. This could be retracted 3 cm placed at the level of the clavicles. 2. Stable right mid upper lung zone atelectasis. 3. Improved right basilar atelectasis. 4. Small bilateral pleural effusions, improved. 5. Stable right pigtail catheter without pneumothorax. 6. Stable subcutaneous emphysema and changes of COPD. Electronically Signed   By: Claudie Revering M.D.   On: 11/05/2016 17:58   Dg Chest Port 1 View Result Date: 11/05/2016 CLINICAL DATA:  Shortness of breath EXAM: PORTABLE CHEST 1 VIEW COMPARISON:  Nov 04, 2016 FINDINGS: Chest tube remains on the right. There is extensive subcutaneous air on the right. No pneumothorax is appreciable, however. Endotracheal tube and nasogastric tube have been removed. There is persistent patchy airspace consolidation in the right base. There is new patchy airspace opacity in the left base. Small pleural effusions remain. Heart size and pulmonary vascularity are normal. No adenopathy evident. No appreciable bone lesions. IMPRESSION: Chest tube remains on the right, unchanged in position. There is extensive air throughout the soft tissues on the right as well as extending into the left neck region. No pneumothorax evident. New airspace opacity in the left base with stable airspace opacity in the right base. Small pleural  effusions. No change in cardiac silhouette. Electronically Signed   By: Lowella Grip III M.D.   On: 11/05/2016 07:59   Dg Chest Port 1 View Result Date: 11/04/2016 CLINICAL DATA:  Respiratory failure, intubated patient, chest tube treatment of left-sided pneumothorax. History  of right upper lobe malignancy, COPD, former smoker. EXAM: PORTABLE CHEST 1 VIEW COMPARISON:  Portable chest x-ray of Nov 03, 2016. FINDINGS: There remains a large amount of subcutaneous emphysema over the right hemithorax extending into the right axilla and right neck. A definite pleural line within the right hemithorax is not observed. There is no mediastinal shift. Patchy interstitial density is present in the right mid and lower lung. Stable right suprahilar density is present. The left lung is well-expanded and clear. There is flattening of the hemidiaphragms bilaterally. The heart and pulmonary vascularity are normal. The endotracheal tube tip lies 3.9 cm above the carina. The esophagogastric tube tip projects below the inferior margin of the image with the proximal port below the GE junction. The right PICC line tip projects over the midportion of the SVC. The small caliber pigtail catheter projects over the posterior aspects of the right sixth and seventh ribs. IMPRESSION: Worsening of subcutaneous emphysema over the right hemithorax and base of the right neck. No definite recurrent pneumothorax on the right is observed. Stable interstitial density in the right mid to lower lung and stable right suprahilar density. Underlying COPD. The support tubes are in reasonable position. Electronically Signed   By: David  Martinique M.D.   On: 11/04/2016 07:39   Dg Chest Portable 1 View Result Date: 10/15/2016 CLINICAL DATA:  Chest tube placement for pneumothorax EXAM: PORTABLE CHEST 1 VIEW COMPARISON:  Same day chest CT FINDINGS: New right-sided chest tube is seen with tip coiled over the right upper thorax at the level of the posterior  fifth and sixth ribs. Re-expansion of the pneumothorax is noted. Endotracheal tube tip is in satisfactory position 4.5 cm above the carina. Gastric tube with side port is seen in the expected location of the stomach. Heart is normal in size. There is aortic atherosclerosis. Streaky parenchymal opacity in the right upper lobe is seen, nonspecific in etiology. Minimal subcutaneous emphysema adjacent to the chest tube insertion site. IMPRESSION: 1. Re-expansion of right-sided pneumothorax status post chest placement. 2. Satisfactory support line and tube positions. 3. Streaky right upper lobe parenchymal opacities are seen, nonspecific in etiology some which may represent areas of atelectasis and/or scarring. This may mask underlying pulmonary lesions. Electronically Signed   By: Ashley Royalty M.D.   On: 10/23/2016 23:21   Dg Chest Port 1 View Result Date: 10/14/2016 CLINICAL DATA:  82 y/o  F; shortness of breath. EXAM: PORTABLE CHEST 1 VIEW COMPARISON:  09/25/2015 chest radiograph. FINDINGS: Stable cardiomegaly. Emphysema and right mid lung zone scarring. Small right pneumothorax. Chronic blunting of left costal diaphragmatic angle. Aortic atherosclerosis with calcification. Enteric tube tip below the field of view. Endotracheal tube tip 3.7 cm from carina on 7 image after readjustment. IMPRESSION: 1. Small right pneumothorax. 2. Stable cardiomegaly and aortic atherosclerosis. 3. Endotracheal tube 3.7 cm from carina. Enteric tube tip below the field of view and abdomen. These results were called by telephone at the time of interpretation on 11/01/2016 at 7:55 pm to Dr. Quintella Reichert , who verbally acknowledged these results. Electronically Signed   By: Kristine Garbe M.D.   On: 11/04/2016 19:56   Ct Angio Chest/abd/pel For Dissection W And/or Wo Contrast Result Date: 10/12/2016 CLINICAL DATA:  82 year old female with respiratory distress. EXAM: CT ANGIOGRAPHY CHEST, ABDOMEN AND PELVIS TECHNIQUE:  Multidetector CT imaging through the chest, abdomen and pelvis was performed using the standard protocol during bolus administration of intravenous contrast. Multiplanar reconstructed images and MIPs were obtained and reviewed to  evaluate the vascular anatomy. CONTRAST:  100 cc Isovue 370 COMPARISON:  Chest radiograph dated 10/17/2016 FINDINGS: CTA CHEST FINDINGS Cardiovascular: There is no cardiomegaly or pericardial effusion. There is irregularity of the wall of the thoracic aorta with noncalcified plaques. Multiple penetrating ulcers noted in the distal descending thoracic aorta. The descending thoracic aorta measures up to 3.7 cm in diameter. There is no dissection of the thoracic aorta. No extravasation of contrast or periaortic hematoma. The origins of the great vessels of the aortic arch appear patent. There is dilatation of the main pulmonary trunk indicative of underlying pulmonary hypertension. Small amount of within the main pulmonary artery most likely iatrogenic and introduced via injection. There is no CT evidence of pulmonary embolism. Mediastinum/Nodes: Right hilar density appears similar to prior study and likely represent postsurgical changes/scarring. No definite hilar or mediastinal adenopathy identified. An enteric tube noted in the esophagus extending to the body of the stomach. Multiple left thyroid hypodense nodules noted measuring up to 12 mm. Lungs/Pleura: There is a moderate size right-sided pneumothorax. There is severe emphysema. There is a 3.0 x 3.8 cm masslike density in the right upper lobe in the region of the previously seen mass on the PET-CT of 11/01/2014. This may represent postsurgical changes or post radiation fibrosis. This area appears more masslike compared to the prior CT of 05/19/2016 likely secondary to pneumothorax and partial atelectasis of the right upper lobe. However, recurrent or residual tumor is not evaluated or excluded on the basis of the CT. There is no pleural  effusion. An endotracheal tube is noted with tip above the carina. There is high-grade narrowing of the right upper lobe bronchus. The remainder of the central airways are patent. Musculoskeletal: There is degenerative changes of the spine. No acute osseous pathology. Review of the MIP images confirms the above findings. CTA ABDOMEN AND PELVIS FINDINGS VASCULAR Aorta: There is diffuse irregularity of the the aortic wall. There is a fusiform 4.8 cm infrarenal abdominal aortic aneurysm which has increased in size compared to the CT of 11/01/2014 when it measured 3.7 cm. There is crescentic thickening of the wall of the distal aorta extending to the aortic bifurcation and right common iliac artery. This has progressed compared to the prior CT and suggests interval mural hemorrhage. There is mild haziness of the periaortic fat. Impending rupture is not entirely excluded. Correlation with clinical exam and vascular surgery consult is advised. There is no extravasation of intravenous contrast outside of the confines of the aorta. Celiac: Patent without evidence of aneurysm, dissection, vasculitis or significant stenosis. SMA: Patent without evidence of aneurysm, dissection, vasculitis or significant stenosis. Renals: Both renal arteries are patent without evidence of aneurysm, dissection, vasculitis, fibromuscular dysplasia or significant stenosis. IMA: The IMA appears patent although with diminished flow. Inflow: The right common iliac artery measures 2.8 cm in diameter. There is diffuse thickening of the wall of the right common iliac artery, increased since the prior CT. The internal and external iliac arteries remain patent however there appears to be decreased flow in there right iliac artery is compared to the left. The visualized right superficial femoral artery appears occluded. Veins: No obvious venous abnormality within the limitations of this arterial phase study. Review of the MIP images confirms the above  findings. NON-VASCULAR Hepatobiliary: There is slight irregularity of the hepatic contour which may represent early changes of cirrhosis. Clinical correlation is recommended. Small scattered foci of enhancement in the left lobe of the liver are not well characterized but may  represent areas of portal venous shunting or tiny flash filling hemangiomas. There is no intrahepatic biliary ductal dilatation. The gallbladder is unremarkable. Pancreas: Unremarkable. No pancreatic ductal dilatation or surrounding inflammatory changes. Spleen: Normal in size without focal abnormality. Adrenals/Urinary Tract: The adrenal glands, kidneys, visualized ureters, and urinary bladder appear unremarkable. Stomach/Bowel: Small scattered sigmoid diverticula without active inflammatory changes. There is a broad-based umbilical hernia containing a short segment of the transverse colon as well as a loop of small bowel. No evidence of obstruction at this level. There is no evidence of bowel obstruction or active inflammation. The appendix is not visualized with certainty. No inflammatory changes identified in the right lower quadrant. Lymphatic: No adenopathy. Reproductive: Hysterectomy.  No pelvic mass. Other: Broad-based umbilical hernia containing a loop of small bowel and a short segment of colon without evidence of obstruction. Musculoskeletal: Osteopenia with degenerative changes of the spine. No acute fracture. Review of the MIP images confirms the above findings. IMPRESSION: 1. Irregularity of the thoracic and abdominal aortic wall. A fusiform infrarenal abdominal aortic aneurysm measures 4.8 cm in greatest axial diameter (previously 3.7 cm on 11/01/2014). There is aneurysmal dilatation of the right common iliac artery measuring up to 2.8 cm. Crescentic thickened wall of the distal abdominal aortic aneurysm as well as thickened wall of the right common iliac artery represent mural hemorrhage. Similar findings were present on the CT  of 11/01/2014 but there has been interval increase in the degree of wall thickening indicative of interval mural bleed. No extravasation of contrast outside of the confines of the aortic wall noted. There is minimal haziness of the periaortic fat. An impending rupture is not entirely excluded. Correlation with clinical exam and vascular surgery consult is advised. No evidence of aortic dissection. 2. Apparent occlusion of the visualized proximal right superficial femoral artery. 3. No CT evidence of pulmonary embolism. Small amount of air within the main pulmonary trunk likely iatrogenic and introduced via IV injection. 4. Severe emphysema and moderate right pneumothorax. 5. Masslike density in the right upper lobe and right perihilar region, likely radiation fibrosis/scarring. Residual or recurrent disease is not evaluated or excluded on the basis of today's exam. 6. Broad-based umbilical hernia containing loops of small bowel and a short segment of transverse colon without evidence of obstruction. These results were called by telephone at the time of interpretation on 10/12/2016 at 9:37 pm to Dr. Tilden Fossa , who verbally acknowledged these results. Electronically Signed   By: Elgie Collard M.D.   On: 11/02/2016 22:14     Cardiac Studies   ECHO: 11/06/2016 ordered  Patient Profile     82 y.o. female w/ hx  HTN, Hypothyroid, COPD Gold on home O2, RUL lung CA s/p XRT and chemo was admitted 05/29 w/ acute on chronic resp failure, R PTX>>ETT, also encephalopathy. Cards saw 06/01 for episodic tachycardia in the setting of worsening lactic acidosis. Felt MAT w/ PAF less likely, some brief NSVT  Assessment & Plan      Paroxysmal tachycardia (HCC) - ST at baseline due to acute illness - also had some bradycardia earlier this admit with HR 40s. - will add PRN Lopressor 2.5 mg - f/u on echo results  Otherwise, per CCM. Principal Problem:   Acute respiratory failure with hypoxia and hypercarbia  (HCC) Active Problems:   Spontaneous tension pneumothorax     Signed, Leanna Battles 8:14 AM 11/06/2016 Pager: [phone]  Attending Note:   The patient was seen and examined.  Agree with  assessment and plan as noted above.  Changes made to the above note as needed.  Patient seen and independently examined with Rosaria Ferries, PA .   We discussed all aspects of the encounter. I agree with the assessment and plan as stated above.  1. Tachycardia :  Paroxysmal , seems to be related to her underlying illness.  ECG shows sinus tach  Continue supportive care. Continue metoprolol  No additional cardiac recs.    I have spent a total of 40 minutes with patient reviewing hospital  notes , telemetry, EKGs, labs and examining patient as well as establishing an assessment and plan that was discussed with the patient. > 50% of time was spent in direct patient care.    Thayer Headings, Brooke Bonito., MD, [facility] 11/06/2016, 5:48 PM [address]  [place] Pager [phone]

## 2016-11-06 NOTE — Progress Notes (Signed)
CRITICAL VALUE ALERT  Critical Value:  Lactic 2.5  Date & Time Notied:  11/06/2016 3:12 AM  Provider Notified: elink 3:12 AM

## 2016-11-06 NOTE — Progress Notes (Signed)
Minimal UOP from foley catheter. Bladder scan revealed <15 in bladder. MD aware

## 2016-11-06 NOTE — Progress Notes (Signed)
eLink Physician-Brief Progress Note Patient Name: ARIELL GUNNELS DOB: 1935/01/08 MRN: [id number]   Date of Service  11/06/2016  HPI/Events of Note  Multiple issues: 1. Nurse reports episode of VT. Cardiology at bedside and feels that this is MAT and 2. Review CXR - CXR shows ETT 1.5 cm above carina and small L pneumothorax vs skin fold.   eICU Interventions  Will order: 1. Pull ETT back 2 cm. 2. Repeat CXR post reposition of ETT.      Intervention Category Major Interventions: Other:  Sommer,Steven Cornelia Copa 11/06/2016, 3:11 AM

## 2016-11-06 NOTE — Progress Notes (Signed)
[place] Progress Note Patient Name: Susan Garrett DOB: 09-Nov-1934 MRN: [id number]   Date of Service  11/06/2016  HPI/Events of Note  K+ = 3.7, Mg++ = 2.0 and Creatinine = 1.12.  eICU Interventions  Will replace K+.     Intervention Category Major Interventions: Electrolyte abnormality - evaluation and management  Sommer,Steven Eugene 11/06/2016, 3:23 AM

## 2016-11-06 DEATH — deceased

## 2016-11-07 ENCOUNTER — Inpatient Hospital Stay (HOSPITAL_COMMUNITY): Payer: Medicare Other

## 2016-11-07 DIAGNOSIS — J449 Chronic obstructive pulmonary disease, unspecified: Secondary | ICD-10-CM

## 2016-11-07 DIAGNOSIS — I471 Supraventricular tachycardia: Secondary | ICD-10-CM

## 2016-11-07 DIAGNOSIS — I472 Ventricular tachycardia: Secondary | ICD-10-CM

## 2016-11-07 DIAGNOSIS — I480 Paroxysmal atrial fibrillation: Secondary | ICD-10-CM

## 2016-11-07 DIAGNOSIS — J9621 Acute and chronic respiratory failure with hypoxia: Secondary | ICD-10-CM

## 2016-11-07 DIAGNOSIS — J432 Centrilobular emphysema: Secondary | ICD-10-CM

## 2016-11-07 LAB — BASIC METABOLIC PANEL
ANION GAP: 9 (ref 5–15)
Anion gap: 10 (ref 5–15)
BUN: 45 mg/dL — ABNORMAL HIGH (ref 6–20)
BUN: 31 mg/dL — ABNORMAL HIGH (ref 6–20)
CALCIUM: 8.3 mg/dL — AB (ref 8.9–10.3)
CHLORIDE: 109 mmol/L (ref 101–111)
CO2: 24 mmol/L (ref 22–32)
CO2: 28 mmol/L (ref 22–32)
CREATININE: 0.8 mg/dL (ref 0.44–1.00)
Calcium: 8.6 mg/dL — ABNORMAL LOW (ref 8.9–10.3)
Chloride: 102 mmol/L (ref 101–111)
Creatinine, Ser: 0.92 mg/dL (ref 0.44–1.00)
GFR calc Af Amer: >60 mL/min (ref >60)
GFR calc Af Amer: >60 mL/min (ref >60)
GFR, EST NON AFRICAN AMERICAN: 57 mL/min — AB (ref >60)
GLUCOSE: 185 mg/dL — AB (ref 65–99)
GLUCOSE: 212 mg/dL — AB (ref 65–99)
POTASSIUM: 3.9 mmol/L (ref 3.5–5.1)
Potassium: 3.2 mmol/L — ABNORMAL LOW (ref 3.5–5.1)
Sodium: 143 mmol/L (ref 135–145)
Sodium: 139 mmol/L (ref 135–145)

## 2016-11-07 LAB — CBC WITH DIFFERENTIAL/PLATELET
BASOS ABS: 0 10*3/uL (ref 0.0–0.1)
Basophils Relative: 0 %
Eosinophils Absolute: 0 10*3/uL (ref 0.0–0.7)
Eosinophils Relative: 0 %
HEMATOCRIT: 41.2 % (ref 36.0–46.0)
Hemoglobin: 12.8 g/dL (ref 12.0–15.0)
LYMPHS PCT: 4 %
Lymphs Abs: 0.6 10*3/uL — ABNORMAL LOW (ref 0.7–4.0)
MCH: 26.3 pg (ref 26.0–34.0)
MCHC: 31.1 g/dL (ref 30.0–36.0)
MCV: 84.6 fL (ref 78.0–100.0)
MONO ABS: 0.7 10*3/uL (ref 0.1–1.0)
Monocytes Relative: 5 %
NEUTROS ABS: 12.7 10*3/uL — AB (ref 1.7–7.7)
Neutrophils Relative %: 91 %
Platelets: 197 10*3/uL (ref 150–400)
RBC: 4.87 MIL/uL (ref 3.87–5.11)
RDW: 15.9 % — AB (ref 11.5–15.5)
WBC: 14 10*3/uL — ABNORMAL HIGH (ref 4.0–10.5)

## 2016-11-07 LAB — GLUCOSE, CAPILLARY
GLUCOSE-CAPILLARY: 148 mg/dL — AB (ref 65–99)
Glucose-Capillary: 195 mg/dL — ABNORMAL HIGH (ref 65–99)
Glucose-Capillary: 170 mg/dL — ABNORMAL HIGH (ref 65–99)
Glucose-Capillary: 196 mg/dL — ABNORMAL HIGH (ref 65–99)
Glucose-Capillary: 225 mg/dL — ABNORMAL HIGH (ref 65–99)
Glucose-Capillary: 198 mg/dL — ABNORMAL HIGH (ref 65–99)

## 2016-11-07 LAB — PHOSPHORUS
PHOSPHORUS: 1.9 mg/dL — AB (ref 2.5–4.6)
Phosphorus: 1.6 mg/dL — ABNORMAL LOW (ref 2.5–4.6)

## 2016-11-07 LAB — MAGNESIUM
MAGNESIUM: 2 mg/dL (ref 1.7–2.4)
Magnesium: 2.1 mg/dL (ref 1.7–2.4)

## 2016-11-07 MED ORDER — BUDESONIDE 0.5 MG/2ML IN SUSP
0.5000 mg | Freq: Two times a day (BID) | RESPIRATORY_TRACT | Status: DC
  Administered 2016-11-07 – 2016-11-11 (×9): 0.5 mg via RESPIRATORY_TRACT
  Filled 2016-11-07 (×9): qty 2

## 2016-11-07 MED ORDER — AMIODARONE LOAD VIA INFUSION
150.0000 mg | Freq: Once | INTRAVENOUS | Status: AC
  Administered 2016-11-07: 150 mg via INTRAVENOUS
  Filled 2016-11-07: qty 83.34

## 2016-11-07 MED ORDER — AMIODARONE HCL IN DEXTROSE 360-4.14 MG/200ML-% IV SOLN
30.0000 mg/h | INTRAVENOUS | Status: DC
  Administered 2016-11-08 – 2016-11-09 (×6): 30 mg/h via INTRAVENOUS
  Filled 2016-11-07 (×6): qty 200

## 2016-11-07 MED ORDER — DOCUSATE SODIUM 50 MG/5ML PO LIQD: 100.0000 mg | Freq: Two times a day (BID) | ORAL | Status: DC | PRN

## 2016-11-07 MED ORDER — DILTIAZEM HCL 30 MG PO TABS
30.0000 mg | ORAL_TABLET | Freq: Four times a day (QID) | ORAL | Status: DC
  Administered 2016-11-07: 30 mg via ORAL
  Filled 2016-11-07: qty 1

## 2016-11-07 MED ORDER — AMIODARONE HCL IN DEXTROSE 360-4.14 MG/200ML-% IV SOLN
60.0000 mg/h | INTRAVENOUS | Status: AC
  Administered 2016-11-07 – 2016-11-08 (×2): 60 mg/h via INTRAVENOUS
  Filled 2016-11-07 (×2): qty 200

## 2016-11-07 MED ORDER — BISACODYL 10 MG RE SUPP
10.0000 mg | Freq: Every day | RECTAL | Status: DC | PRN
  Filled 2016-11-07: qty 1

## 2016-11-07 MED ORDER — ARFORMOTEROL TARTRATE 15 MCG/2ML IN NEBU
15.0000 ug | INHALATION_SOLUTION | Freq: Two times a day (BID) | RESPIRATORY_TRACT | Status: DC
  Administered 2016-11-07 – 2016-11-11 (×9): 15 ug via RESPIRATORY_TRACT
  Filled 2016-11-07 (×9): qty 2

## 2016-11-07 MED ORDER — DILTIAZEM HCL 60 MG PO TABS
60.0000 mg | ORAL_TABLET | Freq: Four times a day (QID) | ORAL | Status: DC
  Administered 2016-11-07 – 2016-11-09 (×7): 60 mg via ORAL
  Filled 2016-11-07 (×7): qty 1

## 2016-11-07 MED ORDER — METOPROLOL TARTRATE 5 MG/5ML IV SOLN
2.5000 mg | Freq: Four times a day (QID) | INTRAVENOUS | Status: DC
  Administered 2016-11-07 – 2016-11-11 (×15): 2.5 mg via INTRAVENOUS
  Filled 2016-11-07 (×16): qty 5

## 2016-11-07 MED ORDER — ARFORMOTEROL TARTRATE 15 MCG/2ML IN NEBU: 15.0000 ug | INHALATION_SOLUTION | Freq: Two times a day (BID) | RESPIRATORY_TRACT | Status: DC

## 2016-11-07 MED ORDER — BUDESONIDE 0.5 MG/2ML IN SUSP: 0.5000 mg | Freq: Two times a day (BID) | RESPIRATORY_TRACT | Status: DC

## 2016-11-07 MED ORDER — POTASSIUM CHLORIDE 10 MEQ/100ML IV SOLN
10.0000 meq | INTRAVENOUS | Status: AC
  Administered 2016-11-07 – 2016-11-08 (×4): 10 meq via INTRAVENOUS
  Filled 2016-11-07 (×4): qty 100

## 2016-11-07 MED FILL — Medication: Qty: 1 | Status: AC

## 2016-11-07 NOTE — Progress Notes (Signed)
RT unable to move ETT from pts left mouth due to pt not letting RT move ETT. Pt only wants ETT on left

## 2016-11-07 NOTE — Progress Notes (Signed)
eLink Physician-Brief Progress Note Patient Name: JUNO ALERS DOB: 12/10/1934 MRN: [id number]   Date of Service  11/07/2016  HPI/Events of Note  AFIB with RVR - Ventricular rate = 162 and has been sustained for the last 30 minutes. Currently on Cardizem 60 mg PO Q 6 hours and Metoprolol IV. K+ = 3.2 and Creatinine = 0.80.   eICU Interventions  Will order: 1. Replace K+. 2. Amiodarone IV load and infusion.      Intervention Category Major Interventions: Arrhythmia - evaluation and management  Zacchary Pompei Cornelia Copa 11/07/2016, 9:31 PM

## 2016-11-07 NOTE — Progress Notes (Signed)
Progress Note  Patient Name: Susan Garrett Date of Encounter: 11/07/2016  Primary Cardiologist: New (Nahser)  Subjective   Intubated, awake. Clearly alert and able to communicate with expressions and gestures. Christophe Louis (her niece, works w [employer]) at bedside.  Inpatient Medications    Scheduled Meds: . acetylcysteine  4 mL Nebulization BID  . aspirin  81 mg Per Tube Daily  . chlorhexidine gluconate (MEDLINE KIT)  15 mL Mouth Rinse BID  . chlorhexidine gluconate (MEDLINE KIT)  15 mL Mouth Rinse BID  . heparin  5,000 Units Subcutaneous Q8H  . insulin aspart  0-15 Units Subcutaneous Q4H  . ipratropium-albuterol  3 mL Nebulization Q6H  . mouth rinse  15 mL Mouth Rinse 10 times per day  . methimazole  2.5 mg Per Tube Daily  . methylPREDNISolone (SOLU-MEDROL) injection  40 mg Intravenous Q12H  . pantoprazole (PROTONIX) IV  40 mg Intravenous Q24H   Continuous Infusions: . sodium chloride 75 mL/hr at 11/06/16 2000  . cefTAZidime (FORTAZ)  IV 2 g (11/07/16 0744)  . feeding supplement (VITAL HIGH PROTEIN) 1,000 mL (11/07/16 0744)  . fentaNYL infusion INTRAVENOUS 50 mcg/hr (11/06/16 2000)   PRN Meds: fentaNYL, fentaNYL (SUBLIMAZE) injection, labetalol, levalbuterol, metoprolol tartrate   Vital Signs    Vitals:   11/07/16 0630 11/07/16 0700 11/07/16 0736 11/07/16 0746  BP:  (!) 154/94  (!) 158/113  Pulse: 77 72  88  Resp: '18 18  18  '$ Temp:  97.7 F (36.5 C)    TempSrc:      SpO2:   100% 100%  Weight:      Height:        Intake/Output Summary (Last 24 hours) at 11/07/16 0751 Last data filed at 11/07/16 0700  Gross per 24 hour  Intake          2201.62 ml  Output              796 ml  Net          1405.62 ml   Filed Weights   11/05/16 0600 11/06/16 0500 11/07/16 0500  Weight: 145 lb 15.1 oz (66.2 kg) 148 lb 5.9 oz (67.3 kg) 156 lb 15.5 oz (71.2 kg)    Telemetry    Frequent bursts of PAT. MAT seen. At least one sustained episode this morning is strongly  suggestive of AFib w RVR, but only several minutes long. - Personally Reviewed  ECG    11/06/16 ST to PAT - Personally Reviewed  Physical Exam  Alert, intubated GEN: No acute distress.   Neck: No JVD Cardiac: RRR, frequent bursts of ectopy no murmurs, rubs, or gallops.  Hard to hear heart sounds due to emphysema.  Respiratory: diminished bilaterally. Subcutaneous emphysema, crepitation GI: Soft, nontender, non-distended  MS: No edema; No deformity. Neuro:  Nonfocal  Psych: Normal affect   Labs    Chemistry Recent Labs Lab 10/12/2016 1929  11/05/16 1232 11/06/16 0230 11/07/16 0456  NA 143  < > 140 141 143  K 4.3  < > 4.6 3.7 3.9  CL 108  < > 103 104 109  CO2 30  < > '28 22 24  '$ GLUCOSE 209*  < > 124* 104* 185*  BUN 14  < > 18 31* 45*  CREATININE 0.89  < > 0.83 1.12* 0.92  CALCIUM 8.4*  < > 8.6* 8.5* 8.6*  PROT 5.6*  --   --   --   --   ALBUMIN 3.0*  --   --   --   --  AST 50*  --   --   --   --   ALT 28  --   --   --   --   ALKPHOS 94  --   --   --   --   BILITOT 0.8  --   --   --   --   GFRNONAA 60*  < > >60 45* 57*  GFRAA >60  < > >60 52* >60  ANIONGAP 5  < > '9 15 10  '$ < > = values in this interval not displayed.   Hematology Recent Labs Lab 11/05/16 0344 11/06/16 0230 11/07/16 0456  WBC 22.6* 17.5* 14.0*  RBC 4.92 4.82 4.87  HGB 13.0 12.9 12.8  HCT 44.4 42.2 41.2  MCV 90.2 87.6 84.6  MCH 26.4 26.8 26.3  MCHC 29.3* 30.6 31.1  RDW 16.4* 15.7* 15.9*  PLT 193 201 197    Cardiac EnzymesNo results for input(s): TROPONINI in the last 168 hours.  Recent Labs Lab 10/07/2016 2012  TROPIPOC 0.00     BNP Recent Labs Lab 10/07/2016 1929  BNP 106.8*     DDimer No results for input(s): DDIMER in the last 168 hours.   Radiology    Ct Chest Wo Contrast  Result Date: 11/06/2016 CLINICAL DATA:  Follow-up pneumothorax EXAM: CT CHEST WITHOUT CONTRAST TECHNIQUE: Multidetector CT imaging of the chest was performed following the standard protocol without IV contrast.  COMPARISON:  Chest radiograph dated 11/06/2016. CT chest dated 11/01/2016. FINDINGS: Cardiovascular: Heart is normal size.  No pericardial effusion. Mild coronary atherosclerosis in the LAD. No evidence of thoracic aortic aneurysm. Atherosclerotic calcifications the aortic arch. Mediastinum/Nodes: No suspicious mediastinal lymphadenopathy. Visualized thyroid is unremarkable. Lungs/Pleura: Endotracheal tube terminates 8 mm above the carina. Very mild debris at the carina (series 4/image 63). Peribronchovascular soft tissue thickening along the right upper lobe bronchus with postobstructive opacity (series 4/ image 60), similar to prior studies, favoring radiation changes. Slightly more confluent opacity may be present on the current study along the superolateral margin (series 4/image 87), but this is poorly evaluated in the acute setting. 6 mm left lower lobe nodule (series 4/ image 107), increased from 4 mm on the most recent CT, likely infectious/inflammatory. Extensive emphysematous changes bilaterally. Interval placement of a right pigtail chest tube. Trace right basilar pneumothorax. Upper Abdomen: Visualized upper abdomen is notable for an incompletely visualized infrarenal abdominal aortic aneurysm measuring at least 4.4 cm and notable for suspected intramural hemorrhage (series 3/ image 180), better evaluated on recent dedicated CTA. Cholelithiasis. Musculoskeletal: Extensive subcutaneous emphysema along the bilateral supraclavicular regions, bilateral anterior chest, bilateral arms, right back, and bilateral anterior abdominal walls. Degenerative changes of the thoracic spine. IMPRESSION: Interval placement of a right apical chest tube. Trace right basilar pneumothorax. Extensive subcutaneous emphysema, as above. Peribronchial soft tissue thickening with suspected postobstructive opacity in the right upper lobe, favoring radiation changes. Given slightly more confluent opacity on the current study,  attention on follow-up (in the nonemergent setting) is suggested in 3 months. 6 mm left lower lobe nodule, increased from most recent CT, likely infectious/inflammatory. Endotracheal tube terminates 8 mm above the carina. Incompletely visualized abdominal aortic aneurysm measuring at least 4.4 cm with suspected intramural hemorrhage, better evaluated on recent dedicated CTA. Correlate with that study for recommendations. Electronically Signed   By: Julian Hy M.D.   On: 11/06/2016 09:14   Dg Chest Port 1 View  Result Date: 11/07/2016 CLINICAL DATA:  Pneumothorax. EXAM: PORTABLE CHEST 1 VIEW COMPARISON:  Chest x-rays dated 11/06/2016, 11/05/2016 and 10/13/2016. Comparison also made to chest CT of 11/06/2016. FINDINGS: Again noted are extensive subcutaneous emphysematous change over the chest walls bilaterally and neck. Right-sided chest tube appears stable in position with tip directed towards the right upper lobe. Endotracheal tube is stable in position with tip positioned at the level of the carina. Enteric tube passes below the diaphragm. No pneumothorax seen on this chest x-ray. Trace basilar pneumothorax described on yesterday's chest CT. No new lung findings. No pleural effusion seen. IMPRESSION: Stable chest x-ray compared to yesterday's exam. Extensive subcutaneous emphysema. Support apparatus is stable in position. No pneumothorax seen on today's chest x-ray, but trace basilar pneumothorax demonstrated on yesterday's CT. Electronically Signed   By: Franki Cabot M.D.   On: 11/07/2016 06:51   Dg Chest Port 1 View  Result Date: 11/06/2016 CLINICAL DATA:  Endotracheal tube adjustment EXAM: PORTABLE CHEST 1 VIEW COMPARISON:  Study obtained earlier in the day FINDINGS: Endotracheal tube tip is 1.8 cm above the carina. There is again noted extensive subcutaneous air. No pneumothorax is demonstrated currently. Chest tube remains on the right, unchanged in position. There are small pleural effusions  bilaterally. There is atelectatic change in the right base. Heart size is within normal limits. The pulmonary vascularity is normal. No evident adenopathy. IMPRESSION: Endotracheal tube as described. There is very little change in endotracheal tube position compared to earlier in the day. Chest tube positioning is stable. Extensive subcutaneous air remains. No pneumothorax is demonstrated. There are small pleural effusions bilaterally with right base atelectasis, unchanged. Stable cardiac silhouette. Electronically Signed   By: Lowella Grip III M.D.   On: 11/06/2016 08:02   Dg Chest Port 1 View  Result Date: 11/06/2016 CLINICAL DATA:  Ventilator dependent EXAM: PORTABLE CHEST 1 VIEW COMPARISON:  11/05/2016, 11/04/2016, 10/12/2016 FINDINGS: Endotracheal tube tip is approximately 15 mm superior to the carina. Persistent large amount of subcutaneous emphysema within the bilateral supraclavicular regions and right chest wall. Right upper chest drainage catheter remains in place. The lungs are hyperinflated as before. Slight increased opacity at the right base. Linear scar or atelectasis in the right upper lobe. Probable skin fold left upper chest. IMPRESSION: 1. Endotracheal tube tip approximately 15 mm superior to carina 2. Continued large amount of subcutaneous emphysema in the right chest wall and small amount in the left supraclavicular fossa. 3. Skin fold artifact versus small left upper lateral pneumothorax, radiographic follow-up recommended 4. Slight increased right basilar atelectasis or infiltrate. Probable tiny right pleural effusion. Electronically Signed   By: Donavan Foil M.D.   On: 11/06/2016 02:34   Dg Chest Port 1 View  Result Date: 11/05/2016 CLINICAL DATA:  82 year old with small caliber catheter drainage of a right pneumothorax. Follow-up subcutaneous emphysema. EXAM: PORTABLE CHEST 1 VIEW 4:55 p.m.: COMPARISON:  Portable chest x-ray earlier today 5:45 a.m., yesterday, 10/07/2016 and  earlier, including CTA chest 11/01/2016. FINDINGS: Small caliber pigtail drainage catheter in the right pleural space. No residual pneumothorax. Extensive subcutaneous emphysema throughout the right lateral chest wall extending into both sides of the neck, unchanged since earlier today, unchanged to slightly progressive since yesterday. Emphysematous changes in the upper lobes. Airspace consolidation in the lower lobes with improved aeration since earlier today. No new abnormalities in either lung. Heart size upper normal for AP portable technique, unchanged. Pulmonary vascularity normal. IMPRESSION: 1. No residual right pneumothorax with small caliber pigtail drainage catheter in place. 2. Extensive subcutaneous emphysema involving the right lateral chest wall and  both sides of the neck, not significantly changed since earlier today and perhaps slightly worse than yesterday. 3. Atelectasis and/or pneumonia in the lower lobes, with improved aeration since earlier today. No new abnormalities. Electronically Signed   By: Evangeline Dakin M.D.   On: 11/05/2016 19:51   Dg Chest Port 1 View  Result Date: 11/05/2016 CLINICAL DATA:  Intubated. EXAM: PORTABLE CHEST 1 VIEW COMPARISON:  Earlier today. FINDINGS: Interval endotracheal tube with its tip 4.5 cm above the carina. A right chest pigtail catheter remains in place with no pneumothorax seen there is there are companion rib shadows. No significant change in subcutaneous emphysema on the right and in the left neck and supraclavicular region. Normal sized heart. The lungs remain hyperexpanded with improved atelectasis at the right lung base. Stable linear density in the right mid to upper lung zone. Small left pleural effusion, decreased since the first examination today. No acute bony abnormality. IMPRESSION: 1. The endotracheal to tip 4.5 cm above the carina. This could be retracted 3 cm placed at the level of the clavicles. 2. Stable right mid upper lung zone  atelectasis. 3. Improved right basilar atelectasis. 4. Small bilateral pleural effusions, improved. 5. Stable right pigtail catheter without pneumothorax. 6. Stable subcutaneous emphysema and changes of COPD. Electronically Signed   By: Claudie Revering M.D.   On: 11/05/2016 17:58   Dg Abd Portable 1v  Result Date: 11/06/2016 CLINICAL DATA:  Orogastric tube placement. EXAM: PORTABLE ABDOMEN - 1 VIEW COMPARISON:  None. FINDINGS: Orogastric tube present with the tip coiled in the proximal stomach at the level of the fundus. Visualized upper abdominal bowel gas pattern is unremarkable. There is a large amount of subcutaneous air throughout the lower chest wall extending into the upper abdominal wall related to a right pneumothorax with decompression of air into the soft tissues. IMPRESSION: Orogastric tube is coiled in the fundus of the stomach. Electronically Signed   By: Aletta Edouard M.D.   On: 11/06/2016 10:37    Cardiac Studies  Echo 2016 - Left ventricle: The cavity size was normal. Wall thickness was   normal. Systolic function was normal. The estimated ejection   fraction was in the range of 55% to 60%. Wall motion was normal;   there were no regional wall motion abnormalities. - Mitral valve: Calcified annulus. - Right ventricle: The cavity size was mildly dilated. - Right atrium: The atrium was mildly dilated.   Current echo pending  Patient Profile     82 y.o. female with HTN, Hypothyroidism, chronic respiratory failure (COPD on home O2, RUL lung CA s/p XRT and chemo) was admitted 05/29 with acute on chronic resp failure and right pneumothorax. Consulted 06/01 for episodic tachycardia in the setting of worsening lactic acidosis.   Assessment & Plan    1. PAT/MAT: will start diltiazem via NGT now that her BP is high. Can still give PRN beta blocker IV. Will improve or worsen in parallel with her respiratory status. 2. PAFib: very brief. Will start anticoagulation if episodes are longer  than just a few minutes. 3. VT:  Not seen today. 4. Ac. resp failure/COPD: predisposes to a variety of supraventricular arrhythmia   Signed, Sanda Klein, MD  11/07/2016, 7:51 AM

## 2016-11-07 NOTE — Progress Notes (Signed)
eLink Physician-Brief Progress Note Patient Name: Susan Garrett DOB: 26-Jun-1934 MRN: [id number]   Date of Service  11/07/2016  HPI/Events of Note  Intermittently in AFIB with ventricular rate in 130's. Now ventricular rate = 74 NSR. Metoprolol and Cardizem doses increased today. Mg++ = 2.0.  eICU Interventions  Will order: 1. BMP now.      Intervention Category Major Interventions: Arrhythmia - evaluation and management  Selinda Korzeniewski Eugene 11/07/2016, 6:29 PM

## 2016-11-07 NOTE — Progress Notes (Signed)
PULMONARY / CRITICAL CARE MEDICINE   Name: Susan Garrett MRN: [id number] DOB: 10-25-1934    ADMISSION DATE:  10/30/2016 CONSULTATION DATE:  10/17/2016  REFERRING MD:  Ralene Bathe  CHIEF COMPLAINT:  SOB  Brief:   82 y.o. female with PMH of COPD on chronic 2L O2 (followed by Dr. Melvyn Novas) and  RUL lung CA s/p chemoradiation with chemotherapy in 2016 (followed by Dr. Earlie Server).  On 5/29 found in respiratory distress. EMS on arrival noted SpO2 of 85% on 2L.  En route to ED, she became unresponsive and remained so while in ED and was intubated. ABG demonstrated acute hypercarbic respiratory failure. CXR demonstrated stable cardiomegaly and atherosclerosis, along with possible small right pneumothorax.    SUBJECTIVE:   Remains on vent Periodic episodes of tachycardia  VITAL SIGNS: BP (!) 143/97   Pulse 93   Temp 97.8 F (36.6 C)   Resp 18   Ht 5\' 8"  (1.727 m)   Wt 71.2 kg (156 lb 15.5 oz)   SpO2 100%   BMI 23.87 kg/m   HEMODYNAMICS:    VENTILATOR SETTINGS: Vent Mode: PRVC FiO2 (%):  [40 %] 40 % Set Rate:  [18 bmp] 18 bmp Vt Set:  [500 mL] 500 mL PEEP:  [3 cmH20] 3 cmH20 Plateau Pressure:  [19 cmH20-33 cmH20] 25 cmH20  INTAKE / OUTPUT: I/O last 3 completed shifts: In: 3436.7 [I.V.:1211.3; Other:20; NG/GT:1055.3; IV Piggyback:1150] Out: 9833 [Urine:1105; Chest Tube:56]   PHYSICAL EXAMINATION: General:  Resting comfortably in bed HENT: NCAT ETT in place PULM: poor air movement, no wheezing, vent support CV: RRR, no mgr GI: BS+, soft, nontender MSK: normal bulk and tone Neuro: awake, alert, no distress, MAEW   LABS:  BMET  Recent Labs Lab 11/05/16 1232 11/06/16 0230 11/07/16 0456  NA 140 141 143  K 4.6 3.7 3.9  CL 103 104 109  CO2 28 22 24   BUN 18 31* 45*  CREATININE 0.83 1.12* 0.92  GLUCOSE 124* 104* 185*    Electrolytes  Recent Labs Lab 11/05/16 1232 11/06/16 0230 11/06/16 0907 11/06/16 1632 11/07/16 0456  CALCIUM 8.6* 8.5*  --   --  8.6*  MG  --  2.0  2.1 2.0 2.1  PHOS  --  2.0* 3.3 2.6 1.6*    CBC  Recent Labs Lab 11/05/16 0344 11/06/16 0230 11/07/16 0456  WBC 22.6* 17.5* 14.0*  HGB 13.0 12.9 12.8  HCT 44.4 42.2 41.2  PLT 193 201 197    Coag's No results for input(s): APTT, INR in the last 168 hours.  Sepsis Markers  Recent Labs Lab 11/04/16 0258 11/05/16 0344  11/05/16 2057 11/06/16 0230 11/06/16 0907  LATICACIDVEN  --   --   < > 4.1* 2.5* 2.2*  PROCALCITON 0.51 0.46  --   --  0.69  --   < > = values in this interval not displayed.  ABG  Recent Labs Lab 11/04/16 1047 11/05/16 0917 11/05/16 1833  PHART 7.255* 7.253* 7.442  PCO2ART 52.5* 68.1* 47.4  PO2ART 113.0* 79.9* 491.0*    Liver Enzymes  Recent Labs Lab 10/22/2016 1929  AST 50*  ALT 28  ALKPHOS 94  BILITOT 0.8  ALBUMIN 3.0*    Cardiac Enzymes No results for input(s): TROPONINI, PROBNP in the last 168 hours.  Glucose  Recent Labs Lab 11/06/16 1600 11/06/16 2014 11/06/16 2316 11/07/16 0316 11/07/16 0732 11/07/16 1113  GLUCAP 169* 198* 221* 195* 148* 170*    Imaging Dg Chest Port 1 View  Result Date: 11/07/2016  CLINICAL DATA:  Pneumothorax. EXAM: PORTABLE CHEST 1 VIEW COMPARISON:  Chest x-rays dated 11/06/2016, 11/05/2016 and 10/16/2016. Comparison also made to chest CT of 11/06/2016. FINDINGS: Again noted are extensive subcutaneous emphysematous change over the chest walls bilaterally and neck. Right-sided chest tube appears stable in position with tip directed towards the right upper lobe. Endotracheal tube is stable in position with tip positioned at the level of the carina. Enteric tube passes below the diaphragm. No pneumothorax seen on this chest x-ray. Trace basilar pneumothorax described on yesterday's chest CT. No new lung findings. No pleural effusion seen. IMPRESSION: Stable chest x-ray compared to yesterday's exam. Extensive subcutaneous emphysema. Support apparatus is stable in position. No pneumothorax seen on today's chest  x-ray, but trace basilar pneumothorax demonstrated on yesterday's CT. Electronically Signed   By: Franki Cabot M.D.   On: 11/07/2016 06:51     STUDIES:  CXR 5/29 > stable cardiomegaly and atherosclerosis, along with possible small right pneumothorax.  CT head 5/29 > No acute. Stable chronic microvascular ischemic changes and mild parenchymal volume loss  CT chest 5/29 > severe emphysema, Chest tube in place CXR 5/30 > worsening subcutaneous emphysema over the right hemithorax and base of the right neck  CULTURES: Blood 5/29 >  Sputum 5/29 >  MRSA PCR 5/29 > Negative   ANTIBIOTICS: Ceftriaxone 5/29 >   SIGNIFICANT EVENTS: 5/29 > admit 5/30 extubated 5/31- re intubated sudden  LINES/TUBES: ETT 5/29 > 5/30 ETT 5/31>>> 5/29 pigtail chest tube R  DISCUSSION: 82 y.o. female with hx O2 dependent COPD and remote RUL lung CA, admitted 5/29 with acute hypercarbic respiratory failure and AECOPD requiring intubation.  ASSESSMENT / PLAN:  PULMONARY A: Acute respiratory faiure with hypoxemia Severe emphysema  Persistent airflow obstruction on my exam 6/2 R pneumothorax RUL lung cancer s/p chemo/rad P:   Continue full vent support Add brovana/pulmicort Continue solumedrol Continue ceftriaxone Last dose of mucomyst today > stop now   CARDIOVASCULAR A:  H/O HTN Tachyarrhythmias  P:  Tele Continue metoprolol Continue dilt Appreciate cardiology  RENAL A:   No acute issues P:   Monitor BMET and UOP Replace electrolytes as needed KVO fluids  GASTROINTESTINAL A:   GI prophylaxis Nutrition P:   SUP: Pantoprazole. continue tube feedings  HEMATOLOGIC A:   VTE Prophylaxis, leukocytosis P:  SCD's / heparin. CBC in AM  INFECTIOUS A:   AECOPD R./o PNA rt P:   Continue ceftaz Re assess sputum If spike  ENDOCRINE A:   Hyperthyroidism   P:   Continue methimazole.  NEUROLOGIC A:   Acute hypercarbic encephalopathy. - Resolved  aggitation P:   Monitor  off versed, high risk delirium Continue fentanyl drip WUA  Family updated: Niece updated at bedside by me 6/2  Interdisciplinary Family Meeting v Palliative Care Meeting:  Due by: 11/09/16.  CCM time 35 minutes  Roselie Awkward, MD [facility] Pager: [phone] Cell: [phone] After 3pm or if no response, call [phone]  11/07/2016 12:54 PM

## 2016-11-08 ENCOUNTER — Inpatient Hospital Stay (HOSPITAL_COMMUNITY): Payer: Medicare Other

## 2016-11-08 DIAGNOSIS — J9600 Acute respiratory failure, unspecified whether with hypoxia or hypercapnia: Secondary | ICD-10-CM

## 2016-11-08 LAB — CBC WITH DIFFERENTIAL/PLATELET
BASOS ABS: 0 10*3/uL (ref 0.0–0.1)
Basophils Relative: 0 %
EOS PCT: 0 %
Eosinophils Absolute: 0 10*3/uL (ref 0.0–0.7)
HCT: 39.8 % (ref 36.0–46.0)
Hemoglobin: 12.5 g/dL (ref 12.0–15.0)
LYMPHS ABS: 0.5 10*3/uL — AB (ref 0.7–4.0)
LYMPHS PCT: 3 %
MCH: 26.1 pg (ref 26.0–34.0)
MCHC: 31.4 g/dL (ref 30.0–36.0)
MCV: 83.1 fL (ref 78.0–100.0)
MONO ABS: 1 10*3/uL (ref 0.1–1.0)
MONOS PCT: 6 %
Neutro Abs: 13.6 10*3/uL — ABNORMAL HIGH (ref 1.7–7.7)
Neutrophils Relative %: 91 %
PLATELETS: 211 10*3/uL (ref 150–400)
RBC: 4.79 MIL/uL (ref 3.87–5.11)
RDW: 15.7 % — AB (ref 11.5–15.5)
WBC: 15.1 10*3/uL — ABNORMAL HIGH (ref 4.0–10.5)

## 2016-11-08 LAB — BASIC METABOLIC PANEL
Anion gap: 13 (ref 5–15)
BUN: 32 mg/dL — AB (ref 6–20)
CO2: 26 mmol/L (ref 22–32)
Calcium: 8.5 mg/dL — ABNORMAL LOW (ref 8.9–10.3)
Chloride: 97 mmol/L — ABNORMAL LOW (ref 101–111)
Creatinine, Ser: 0.81 mg/dL (ref 0.44–1.00)
GFR calc Af Amer: >60 mL/min (ref >60)
Glucose, Bld: 203 mg/dL — ABNORMAL HIGH (ref 65–99)
POTASSIUM: 3.8 mmol/L (ref 3.5–5.1)
Sodium: 136 mmol/L (ref 135–145)

## 2016-11-08 LAB — ECHOCARDIOGRAM COMPLETE
Height: 68 in
Weight: 2490.32 oz

## 2016-11-08 LAB — CULTURE, BLOOD (ROUTINE X 2)
CULTURE: NO GROWTH
CULTURE: NO GROWTH
Special Requests: ADEQUATE
Special Requests: ADEQUATE

## 2016-11-08 LAB — GLUCOSE, CAPILLARY
GLUCOSE-CAPILLARY: 179 mg/dL — AB (ref 65–99)
Glucose-Capillary: 184 mg/dL — ABNORMAL HIGH (ref 65–99)
Glucose-Capillary: 164 mg/dL — ABNORMAL HIGH (ref 65–99)
Glucose-Capillary: 138 mg/dL — ABNORMAL HIGH (ref 65–99)
Glucose-Capillary: 145 mg/dL — ABNORMAL HIGH (ref 65–99)

## 2016-11-08 MED ORDER — CHLORHEXIDINE GLUCONATE CLOTH 2 % EX PADS
6.0000 | MEDICATED_PAD | Freq: Every day | CUTANEOUS | Status: DC
  Administered 2016-11-08 – 2016-11-11 (×4): 6 via TOPICAL

## 2016-11-08 MED ORDER — PERFLUTREN LIPID MICROSPHERE
2.0000 mL | INTRAVENOUS | Status: AC | PRN
  Administered 2016-11-08: 2 mL via INTRAVENOUS
  Filled 2016-11-08 (×2): qty 10

## 2016-11-08 MED ORDER — SODIUM CHLORIDE 0.9% FLUSH: 10.0000 mL | INTRAVENOUS | Status: DC | PRN

## 2016-11-08 NOTE — Progress Notes (Signed)
Peripherally Inserted Central Catheter/Midline Placement  The IV Nurse has discussed with the patient and/or persons authorized to consent for the patient, the purpose of this procedure and the potential benefits and risks involved with this procedure.  The benefits include less needle sticks, lab draws from the catheter, and the patient may be discharged home with the catheter. Risks include, but not limited to, infection, bleeding, blood clot (thrombus formation), and puncture of an artery; nerve damage and irregular heartbeat and possibility to perform a PICC exchange if needed/ordered by physician.  Alternatives to this procedure were also discussed.  Bard Power PICC patient education guide, fact sheet on infection prevention and patient information card has been provided to patient /or left at bedside.    PICC/Midline Placement Documentation  PICC Triple Lumen 51/02/58 PICC Right Basilic (Active)  Indication for Insertion or Continuance of Line Vasoactive infusions 11/08/2016 12:15 PM  Exposed Catheter (cm) 0 cm 11/08/2016 12:15 PM  Site Assessment Clean;Dry;Intact 11/08/2016 12:15 PM  Lumen #1 Status Blood return noted;Flushed;Saline locked 11/08/2016 12:15 PM  Lumen #2 Status Blood return noted;Flushed;Saline locked 11/08/2016 12:15 PM  Lumen #3 Status Blood return noted;Flushed;Saline locked 11/08/2016 12:15 PM  Dressing Type Transparent 11/08/2016 12:15 PM  Dressing Status Clean;Dry;Intact;Antimicrobial disc in place 11/08/2016 12:15 PM  Dressing Intervention New dressing 11/08/2016 12:15 PM  Dressing Change Due 11/15/16 11/08/2016 12:15 PM       Aldona Lento L 11/08/2016, 12:31 PM

## 2016-11-08 NOTE — Progress Notes (Signed)
  Echocardiogram 2D Echocardiogram has been performed.  Susan Garrett 11/08/2016, 11:03 AM

## 2016-11-08 NOTE — Progress Notes (Signed)
Progress Note  Patient Name: SAMARIAH HOKENSON Date of Encounter: 11/08/2016  Primary Cardiologist: New (Nahser)  Subjective   Awake, communicative, calm. Intubated. Developed "atrial fibrillation" yesterday evening with rates in 160-170 range. Started on IV amiodarone. Review of strips shows fairly coherent atrial activity - periods of regular ectopic atrial tachycardia and periods of MAT and atrial flutter. 12 lead shows atrial flutter. atrial flutter. Currently in NSR.  Inpatient Medications    Scheduled Meds: . arformoterol  15 mcg Nebulization BID  . aspirin  81 mg Per Tube Daily  . budesonide (PULMICORT) nebulizer solution  0.5 mg Nebulization BID  . chlorhexidine gluconate (MEDLINE KIT)  15 mL Mouth Rinse BID  . chlorhexidine gluconate (MEDLINE KIT)  15 mL Mouth Rinse BID  . diltiazem  60 mg Oral Q6H  . heparin  5,000 Units Subcutaneous Q8H  . insulin aspart  0-15 Units Subcutaneous Q4H  . ipratropium-albuterol  3 mL Nebulization Q6H  . mouth rinse  15 mL Mouth Rinse 10 times per day  . methimazole  2.5 mg Per Tube Daily  . methylPREDNISolone (SOLU-MEDROL) injection  40 mg Intravenous Q12H  . metoprolol tartrate  2.5 mg Intravenous Q6H  . pantoprazole (PROTONIX) IV  40 mg Intravenous Q24H   Continuous Infusions: . sodium chloride 10 mL/hr at 11/07/16 1510  . amiodarone 30 mg/hr (11/08/16 0437)  . cefTAZidime (FORTAZ)  IV Stopped (11/07/16 2147)  . feeding supplement (VITAL HIGH PROTEIN) 1,000 mL (11/07/16 2241)  . fentaNYL infusion INTRAVENOUS 75 mcg/hr (11/07/16 2236)   PRN Meds: bisacodyl, docusate, fentaNYL, fentaNYL (SUBLIMAZE) injection, labetalol, levalbuterol   Vital Signs    Vitals:   11/08/16 0500 11/08/16 0600 11/08/16 0700 11/08/16 0755  BP: (!) 131/91 136/78 119/76   Pulse: 99 94 88   Resp: 18 18 (!) 21   Temp:   98.8 F (37.1 C)   TempSrc:   Axillary   SpO2:  100%  100%  Weight:      Height:        Intake/Output Summary (Last 24 hours) at  11/08/16 0800 Last data filed at 11/08/16 0700  Gross per 24 hour  Intake          3168.82 ml  Output             2050 ml  Net          1118.82 ml   Filed Weights   11/06/16 0500 11/07/16 0500 11/08/16 0359  Weight: 148 lb 5.9 oz (67.3 kg) 156 lb 15.5 oz (71.2 kg) 155 lb 10.3 oz (70.6 kg)    Telemetry    Review of strips shows fairly coherent atrial activity - periods of regular ectopic atrial tachycardia and periods of MAT. Possibly atrial flutter. - Personally Reviewed  ECG    Atrial flutter with variable AV block - Personally Reviewed  Physical Exam  Calm, able to communicate with gestures. Intubated. GEN: No acute distress.   Neck: No JVD Cardiac: RRR, no murmurs, rubs, or gallops.  Respiratory: wheezing bilaterally. GI: Soft, nontender, non-distended  MS: No edema; No deformity. Neuro:  Nonfocal  Psych: Normal affect   Labs    Chemistry Recent Labs Lab 10/14/2016 1929  11/07/16 0456 11/07/16 1855 11/08/16 0444  NA 143  < > 143 139 136  K 4.3  < > 3.9 3.2* 3.8  CL 108  < > 109 102 97*  CO2 30  < > _0 GLUCOSE 209*  < > 185* 212*  203*  BUN 14  < > 45* 31* 32*  CREATININE 0.89  < > 0.92 0.80 0.81  CALCIUM 8.4*  < > 8.6* 8.3* 8.5*  PROT 5.6*  --   --   --   --   ALBUMIN 3.0*  --   --   --   --   AST 50*  --   --   --   --   ALT 28  --   --   --   --   ALKPHOS 94  --   --   --   --   BILITOT 0.8  --   --   --   --   GFRNONAA 60*  < > 57* >60 >60  GFRAA >60  < > >60 >60 >60  ANIONGAP 5  < > _0 < > = values in this interval not displayed.   Hematology Recent Labs Lab 11/06/16 0230 11/07/16 0456 11/08/16 0444  WBC 17.5* 14.0* 15.1*  RBC 4.82 4.87 4.79  HGB 12.9 12.8 12.5  HCT 42.2 41.2 39.8  MCV 87.6 84.6 83.1  MCH 26.8 26.3 26.1  MCHC 30.6 31.1 31.4  RDW 15.7* 15.9* 15.7*  PLT 201 197 211    Cardiac EnzymesNo results for input(s): TROPONINI in the last 168 hours.  Recent Labs Lab 10/10/2016 2012  TROPIPOC 0.00     BNP Recent  Labs Lab 10/16/2016 1929  BNP 106.8*     DDimer No results for input(s): DDIMER in the last 168 hours.   Radiology    Ct Chest Wo Contrast  Result Date: 11/06/2016 CLINICAL DATA:  Follow-up pneumothorax EXAM: CT CHEST WITHOUT CONTRAST TECHNIQUE: Multidetector CT imaging of the chest was performed following the standard protocol without IV contrast. COMPARISON:  Chest radiograph dated 11/06/2016. CT chest dated 10/27/2016. FINDINGS: Cardiovascular: Heart is normal size.  No pericardial effusion. Mild coronary atherosclerosis in the LAD. No evidence of thoracic aortic aneurysm. Atherosclerotic calcifications the aortic arch. Mediastinum/Nodes: No suspicious mediastinal lymphadenopathy. Visualized thyroid is unremarkable. Lungs/Pleura: Endotracheal tube terminates 8 mm above the carina. Very mild debris at the carina (series 4/image 63). Peribronchovascular soft tissue thickening along the right upper lobe bronchus with postobstructive opacity (series 4/ image 60), similar to prior studies, favoring radiation changes. Slightly more confluent opacity may be present on the current study along the superolateral margin (series 4/image 87), but this is poorly evaluated in the acute setting. 6 mm left lower lobe nodule (series 4/ image 107), increased from 4 mm on the most recent CT, likely infectious/inflammatory. Extensive emphysematous changes bilaterally. Interval placement of a right pigtail chest tube. Trace right basilar pneumothorax. Upper Abdomen: Visualized upper abdomen is notable for an incompletely visualized infrarenal abdominal aortic aneurysm measuring at least 4.4 cm and notable for suspected intramural hemorrhage (series 3/ image 180), better evaluated on recent dedicated CTA. Cholelithiasis. Musculoskeletal: Extensive subcutaneous emphysema along the bilateral supraclavicular regions, bilateral anterior chest, bilateral arms, right back, and bilateral anterior abdominal walls. Degenerative changes  of the thoracic spine. IMPRESSION: Interval placement of a right apical chest tube. Trace right basilar pneumothorax. Extensive subcutaneous emphysema, as above. Peribronchial soft tissue thickening with suspected postobstructive opacity in the right upper lobe, favoring radiation changes. Given slightly more confluent opacity on the current study, attention on follow-up (in the nonemergent setting) is suggested in 3 months. 6 mm left lower lobe nodule, increased from most recent CT, likely infectious/inflammatory. Endotracheal tube terminates 8 mm above the carina. Incompletely visualized abdominal  aortic aneurysm measuring at least 4.4 cm with suspected intramural hemorrhage, better evaluated on recent dedicated CTA. Correlate with that study for recommendations. Electronically Signed   By: Julian Hy M.D.   On: 11/06/2016 09:14   Dg Chest Port 1 View  Result Date: 11/08/2016 CLINICAL DATA:  Followup chest 2.  Respiratory failure with hypoxia. EXAM: PORTABLE CHEST 1 VIEW COMPARISON:  11/07/2016 FINDINGS: Endotracheal tube tip is 3 cm above the carina. Nasogastric tube enters stomach. Right chest tube remains in place. Slightly worsened volume loss in the left lower lobe. Extensive persistent subcutaneous emphysema on the right. No right pneumothorax identifiable, though a small pneumothorax may be inapparent. Right hilar prominence and right upper lobe density as seen previously. IMPRESSION: Slight worsened volume loss in the left lower lobe. Lines and tubes well positioned. No visible pneumothorax. Extensive subcutaneous emphysema as seen previously. Right hilar prominence and right upper lobe density as seen previously. Electronically Signed   By: Nelson Chimes M.D.   On: 11/08/2016 07:48   Dg Chest Port 1 View  Result Date: 11/07/2016 CLINICAL DATA:  Pneumothorax. EXAM: PORTABLE CHEST 1 VIEW COMPARISON:  Chest x-rays dated 11/06/2016, 11/05/2016 and 10/28/2016. Comparison also made to chest CT of  11/06/2016. FINDINGS: Again noted are extensive subcutaneous emphysematous change over the chest walls bilaterally and neck. Right-sided chest tube appears stable in position with tip directed towards the right upper lobe. Endotracheal tube is stable in position with tip positioned at the level of the carina. Enteric tube passes below the diaphragm. No pneumothorax seen on this chest x-ray. Trace basilar pneumothorax described on yesterday's chest CT. No new lung findings. No pleural effusion seen. IMPRESSION: Stable chest x-ray compared to yesterday's exam. Extensive subcutaneous emphysema. Support apparatus is stable in position. No pneumothorax seen on today's chest x-ray, but trace basilar pneumothorax demonstrated on yesterday's CT. Electronically Signed   By: Franki Cabot M.D.   On: 11/07/2016 06:51   Dg Abd Portable 1v  Result Date: 11/06/2016 CLINICAL DATA:  Orogastric tube placement. EXAM: PORTABLE ABDOMEN - 1 VIEW COMPARISON:  None. FINDINGS: Orogastric tube present with the tip coiled in the proximal stomach at the level of the fundus. Visualized upper abdominal bowel gas pattern is unremarkable. There is a large amount of subcutaneous air throughout the lower chest wall extending into the upper abdominal wall related to a right pneumothorax with decompression of air into the soft tissues. IMPRESSION: Orogastric tube is coiled in the fundus of the stomach. Electronically Signed   By: Aletta Edouard M.D.   On: 11/06/2016 10:37    Cardiac Studies   Echo 2016 - Left ventricle: The cavity size was normal. Wall thickness was normal. Systolic function was normal. The estimated ejection fraction was in the range of 55% to 60%. Wall motion was normal; there were no regional wall motion abnormalities. - Mitral valve: Calcified annulus. - Right ventricle: The cavity size was mildly dilated. - Right atrium: The atrium was mildly dilated.  Current echo not yet performed  Patient Profile      82 y.o. female with HTN, Hypothyroidism, chronic respiratory failure (COPD on home O2, RUL lung CA s/p XRT and chemo) was admitted 05/29 with acute on chronic resp failure and right pneumothorax. Consulted 06/01 for episodic tachycardia in the setting of worsening lactic acidosis. A variety of types atrial arrhythmia have been recorded, including PAT, MAT, AFlutter and possibly atrial fibrillation.  Assessment & Plan    1. PAT/MAT: Now regular rhythm on amiodarone IV.  Switch to PO/NGT after about 24h load. Continue diltiazem via NGT also for elevated BP. Limit use of beta blocker due to wheezing. Arrhythmia will improve or worsen in parallel with her respiratory status. 2. PAFlutter: Will start full anticoagulation if lengthy episodes recur. 3. VT:  Not seen today. 4. Ac. resp failure/COPD: underlying cause of her supraventricular arrhythmia   Signed, Sanda Klein, MD  11/08/2016, 8:00 AM

## 2016-11-08 NOTE — Progress Notes (Signed)
PULMONARY / CRITICAL CARE MEDICINE   Name: Susan Garrett MRN: [id number] DOB: May 19, 1935    ADMISSION DATE:  10/25/2016 CONSULTATION DATE:  11/01/2016  REFERRING MD:  Ralene Bathe  CHIEF COMPLAINT:  SOB  Brief:   82 y.o. female with PMH of COPD on chronic 2L O2 (followed by Dr. Melvyn Novas) and  RUL lung CA s/p chemoradiation with chemotherapy in 2016 (followed by Dr. Earlie Server).  On 5/29 found in respiratory distress. EMS on arrival noted SpO2 of 85% on 2L.  En route to ED, she became unresponsive and remained so while in ED and was intubated. ABG demonstrated acute hypercarbic respiratory failure. CXR demonstrated stable cardiomegaly and atherosclerosis, along with possible small right pneumothorax.    SUBJECTIVE:   On vent, niece at bedside.  RN reports AF w RVR overnight, started on amiodarone (in addition to cardizem/metoprolol).  Converted to SR.  RN concerned about limited IV access and difficult lab stick. Residual air leak 1/7 on CT  VITAL SIGNS: BP (!) 131/91   Pulse 99   Temp 98.3 F (36.8 C) (Oral)   Resp 18   Ht 5\' 8"  (1.727 m)   Wt 155 lb 10.3 oz (70.6 kg)   SpO2 100%   BMI 23.67 kg/m   HEMODYNAMICS:    VENTILATOR SETTINGS: Vent Mode: PRVC FiO2 (%):  [30 %-40 %] 30 % Set Rate:  [18 bmp] 18 bmp Vt Set:  [500 mL] 500 mL PEEP:  [3 cmH20] 3 cmH20 Plateau Pressure:  [17 cmH20-27 cmH20] 27 cmH20  INTAKE / OUTPUT: I/O last 3 completed shifts: In: 4879 [I.V.:2109; NG/GT:2270; IV Piggyback:500] Out: 2506 [Urine:2490; Chest Tube:16]   PHYSICAL EXAMINATION: General: elderly female in NAD on vent HEENT: MM pink/moist, ETT PSY: calm/appropriate Neuro: awake/alert on vent, communicates appropriately  CV: s1s2 rrr, no m/r/g PULM: even/non-labored, lungs bilaterally diminished, faint wheeze WU:JWJX, non-tender, bsx4 active  Extremities: warm/dry, no edema  Skin: no rashes or lesions    LABS:  BMET  Recent Labs Lab 11/07/16 0456 11/07/16 1855 11/08/16 0444  NA 143 139  136  K 3.9 3.2* 3.8  CL 109 102 97*  CO2 24 28 26   BUN 45* 31* 32*  CREATININE 0.92 0.80 0.81  GLUCOSE 185* 212* 203*    Electrolytes  Recent Labs Lab 11/06/16 1632 11/07/16 0456 11/07/16 1622 11/07/16 1855 11/08/16 0444  CALCIUM  --  8.6*  --  8.3* 8.5*  MG 2.0 2.1 2.0  --   --   PHOS 2.6 1.6* 1.9*  --   --     CBC  Recent Labs Lab 11/06/16 0230 11/07/16 0456 11/08/16 0444  WBC 17.5* 14.0* 15.1*  HGB 12.9 12.8 12.5  HCT 42.2 41.2 39.8  PLT 201 197 211    Coag's No results for input(s): APTT, INR in the last 168 hours.  Sepsis Markers  Recent Labs Lab 11/04/16 0258 11/05/16 0344  11/05/16 2057 11/06/16 0230 11/06/16 0907  LATICACIDVEN  --   --   < > 4.1* 2.5* 2.2*  PROCALCITON 0.51 0.46  --   --  0.69  --   < > = values in this interval not displayed.  ABG  Recent Labs Lab 11/04/16 1047 11/05/16 0917 11/05/16 1833  PHART 7.255* 7.253* 7.442  PCO2ART 52.5* 68.1* 47.4  PO2ART 113.0* 79.9* 491.0*    Liver Enzymes  Recent Labs Lab 10/10/2016 1929  AST 50*  ALT 28  ALKPHOS 94  BILITOT 0.8  ALBUMIN 3.0*    Cardiac Enzymes No results  for input(s): TROPONINI, PROBNP in the last 168 hours.  Glucose  Recent Labs Lab 11/07/16 0732 11/07/16 1113 11/07/16 1533 11/07/16 1907 11/07/16 2310 11/08/16 0259  GLUCAP 148* 170* 196* 225* 198* 179*    Imaging No results found.   STUDIES:  CXR 5/29 > stable cardiomegaly and atherosclerosis, along with possible small right pneumothorax.  CT head 5/29 > No acute. Stable chronic microvascular ischemic changes and mild parenchymal volume loss  CT chest 5/29 > severe emphysema, Chest tube in place CXR 5/30 > worsening subcutaneous emphysema over the right hemithorax and base of the right neck CT Chest 6/1 >> R chest tube with trace basilar PTX, extensive sub-q empysema, peribronchial soft tissue thickening with suspected post-obstructive opacity in the RUL, favor radiation changes, 6 mm LLL  nodule ECHO 6/3 >>   CULTURES: Blood 5/29 >> Sputum 5/29 >> neg MRSA PCR 5/29 >> Negative   ANTIBIOTICS: Ceftriaxone 5/29 >> 6/1 Ceftazidime 6/1 >>   SIGNIFICANT EVENTS: 5/29  Admit 5/30  Extubated 5/31  Re intubated   LINES/TUBES: ETT 5/29 >> 5/30 ETT 5/31 >> R Pigtail CT 5/29 >>   DISCUSSION: 82 y.o. female with hx O2 dependent COPD and remote RUL lung CA, admitted 5/29 with acute hypercarbic respiratory failure and AECOPD requiring intubation.  ASSESSMENT / PLAN:  PULMONARY A: Acute respiratory faiure with hypoxemia Severe emphysema  Persistent airflow obstruction on my exam 6/2 R pneumothorax RUL lung cancer s/p chemo/rad LLL Pulmonary Nodule P:   PRVC 8 cc/kg Wean on PSV as tolerated  Continue Brovana + Pulmicort BID  Solumedrol 40 Q12 Rocephin as above  Intermittent CXR to follow PTX  CARDIOVASCULAR A:  H/O HTN Tachyarrhythmias  AFwRVR - overnight 6/3, converted with amio P:  ICU monitoring  Continue oral Diltiazem  Cardiology following, appreciate assistance  Continue amiodarone gtt  Lopressor PT ASA QD Follow up ECHO Insert PICC line, limited venous access  RENAL A:   No acute issues P:   Trend BMP / urinary output Replace electrolytes as indicated Avoid nephrotoxic agents, ensure adequate renal perfusion  GASTROINTESTINAL A:   GI prophylaxis Nutrition P:   SUP: protonix TF per Nutrition   HEMATOLOGIC A:   VTE Prophylaxis, leukocytosis P:  SCD's / Heparin  Trend CBC   INFECTIOUS A:   AECOPD R/O R PNA P:   Ceftaz as above D6/x  Repeat cultures if fever Follow cultures above  ENDOCRINE A:   Hyperthyroidism   P:   Continue Methimazole   NEUROLOGIC A:   Acute hypercarbic encephalopathy - Resolved  Agitation P:   RASS Goal: 0 to -1  Fentanyl gtt for pain / sedation  High risk for delirium > promote sleep / wake cycle  Daily WUA  Passive ROM Early PT efforts   Family updated:  Niece updated at bedside am  6/3 by NP.   Interdisciplinary Family Meeting v Palliative Care Meeting:  Due by: 11/09/16.  CC Time: 30 minutes  Noe Gens, NP-C [facility] Pgr: [phone] or if no answer [phone] 11/08/2016, 7:41 AM

## 2016-11-08 NOTE — Progress Notes (Signed)
Dr.Sommer made aware of rhythm with rate 130 to 170's lasting 30 minutes. Orders received for amio and K+. Having to call IV team for 2nd iv site in order to start [facility]

## 2016-11-09 ENCOUNTER — Inpatient Hospital Stay (HOSPITAL_COMMUNITY): Payer: Medicare Other

## 2016-11-09 DIAGNOSIS — I484 Atypical atrial flutter: Secondary | ICD-10-CM

## 2016-11-09 LAB — CBC
HCT: 38.9 % (ref 36.0–46.0)
HEMOGLOBIN: 12.3 g/dL (ref 12.0–15.0)
MCH: 26.4 pg (ref 26.0–34.0)
MCHC: 31.6 g/dL (ref 30.0–36.0)
MCV: 83.5 fL (ref 78.0–100.0)
PLATELETS: 193 10*3/uL (ref 150–400)
RBC: 4.66 MIL/uL (ref 3.87–5.11)
RDW: 15.8 % — ABNORMAL HIGH (ref 11.5–15.5)
WBC: 17 10*3/uL — ABNORMAL HIGH (ref 4.0–10.5)

## 2016-11-09 LAB — BASIC METABOLIC PANEL
Anion gap: 8 (ref 5–15)
BUN: 40 mg/dL — AB (ref 6–20)
CHLORIDE: 99 mmol/L — AB (ref 101–111)
CO2: 31 mmol/L (ref 22–32)
CREATININE: 0.93 mg/dL (ref 0.44–1.00)
Calcium: 8.8 mg/dL — ABNORMAL LOW (ref 8.9–10.3)
GFR calc Af Amer: >60 mL/min (ref >60)
GFR, EST NON AFRICAN AMERICAN: 57 mL/min — AB (ref >60)
Glucose, Bld: 181 mg/dL — ABNORMAL HIGH (ref 65–99)
POTASSIUM: 3.6 mmol/L (ref 3.5–5.1)
SODIUM: 138 mmol/L (ref 135–145)

## 2016-11-09 LAB — GLUCOSE, CAPILLARY
GLUCOSE-CAPILLARY: 105 mg/dL — AB (ref 65–99)
GLUCOSE-CAPILLARY: 155 mg/dL — AB (ref 65–99)
GLUCOSE-CAPILLARY: 166 mg/dL — AB (ref 65–99)
GLUCOSE-CAPILLARY: 207 mg/dL — AB (ref 65–99)
Glucose-Capillary: 143 mg/dL — ABNORMAL HIGH (ref 65–99)
Glucose-Capillary: 131 mg/dL — ABNORMAL HIGH (ref 65–99)
Glucose-Capillary: 129 mg/dL — ABNORMAL HIGH (ref 65–99)

## 2016-11-09 LAB — MAGNESIUM: MAGNESIUM: 2 mg/dL (ref 1.7–2.4)

## 2016-11-09 MED ORDER — POLYETHYLENE GLYCOL 3350 17 G PO PACK
17.0000 g | PACK | Freq: Every day | ORAL | Status: DC
  Administered 2016-11-09: 17 g via ORAL
  Filled 2016-11-09: qty 1

## 2016-11-09 MED ORDER — POTASSIUM CHLORIDE 20 MEQ/15ML (10%) PO SOLN
20.0000 meq | ORAL | Status: AC
  Administered 2016-11-09 (×2): 20 meq
  Filled 2016-11-09 (×2): qty 15

## 2016-11-09 MED ORDER — MIDAZOLAM HCL 2 MG/2ML IJ SOLN
1.0000 mg | INTRAMUSCULAR | Status: DC | PRN
  Administered 2016-11-09: 1 mg via INTRAVENOUS
  Filled 2016-11-09: qty 2

## 2016-11-09 MED ORDER — ONDANSETRON HCL 4 MG/2ML IJ SOLN
4.0000 mg | Freq: Three times a day (TID) | INTRAMUSCULAR | Status: DC | PRN
  Administered 2016-11-09: 4 mg via INTRAVENOUS
  Filled 2016-11-09: qty 2

## 2016-11-09 MED ORDER — DILTIAZEM HCL 60 MG PO TABS
90.0000 mg | ORAL_TABLET | Freq: Four times a day (QID) | ORAL | Status: DC
  Administered 2016-11-09 (×2): 90 mg via ORAL
  Filled 2016-11-09 (×2): qty 1

## 2016-11-09 MED ORDER — ASPIRIN 81 MG PO CHEW: 81.0000 mg | CHEWABLE_TABLET | Freq: Every day | ORAL | Status: DC

## 2016-11-09 MED ORDER — METHIMAZOLE 5 MG PO TABS
2.5000 mg | ORAL_TABLET | Freq: Every day | ORAL | Status: DC
  Filled 2016-11-09: qty 1

## 2016-11-09 MED ORDER — FUROSEMIDE 10 MG/ML IJ SOLN
40.0000 mg | Freq: Once | INTRAMUSCULAR | Status: AC
Start: 2016-11-09 — End: 2016-11-09
  Administered 2016-11-09: 40 mg via INTRAVENOUS
  Filled 2016-11-09: qty 4

## 2016-11-09 NOTE — Progress Notes (Signed)
PULMONARY / CRITICAL CARE MEDICINE   Name: Susan Garrett MRN: [id number] DOB: 1934/10/01    ADMISSION DATE:  10/19/2016 CONSULTATION DATE:  10/19/2016  REFERRING MD:  Ralene Bathe  CHIEF COMPLAINT:  SOB  Brief:   82 y.o. female with PMH of COPD on chronic 2L O2 (followed by Dr. Melvyn Novas) and  RUL lung CA s/p chemoradiation with chemotherapy in 2016 (followed by Dr. Earlie Server).  On 5/29 found in respiratory distress. EMS on arrival noted SpO2 of 85% on 2L.  En route to ED, she became unresponsive and remained so while in ED and was intubated. ABG demonstrated acute hypercarbic respiratory failure. CXR demonstrated stable cardiomegaly and atherosclerosis, along with possible small right pneumothorax.    SUBJECTIVE:   No events overnight. Remains on low dose fentanyl gtt.   VITAL SIGNS: BP 121/77   Pulse 80   Temp 98.1 F (36.7 C) (Oral)   Resp 18   Ht 5\' 8"  (1.727 m)   Wt 71.3 kg (157 lb 3 oz)   SpO2 100%   BMI 23.90 kg/m   HEMODYNAMICS:    VENTILATOR SETTINGS: Vent Mode: PRVC FiO2 (%):  [30 %] 30 % Set Rate:  [18 bmp] 18 bmp Vt Set:  [500 mL] 500 mL PEEP:  [3 cmH20] 3 cmH20 Pressure Support:  [10 cmH20] 10 cmH20 Plateau Pressure:  [24 cmH20-25 cmH20] 25 cmH20  INTAKE / OUTPUT: I/O last 3 completed shifts: In: 4227.3 [I.V.:1507.3; NG/GT:2170; IV Piggyback:550] Out: 2850 [Urine:2850]   PHYSICAL EXAMINATION: General appearance: elderly female, no acute distress Eyes:PERRL, EOMI bilaterally. Mouth:  membranes and no mucosal ulcerations, ETT in place  Neck: Trachea midline  Lungs/chest: Exp Wheeze, no Crackles, non-labored  CV: A.Fib, no MRGs  Abdomen: Soft, non-tender; no masses or HSM Extremities: No peripheral edema  Skin: warm, dry, intact  Psych: alert, nods to questions approprietly, follows commands    LABS:  BMET  Recent Labs Lab 11/07/16 1855 11/08/16 0444 11/09/16 0447  NA 139 136 138  K 3.2* 3.8 3.6  CL 102 97* 99*  CO2 28 26 31   BUN 31* 32* 40*   CREATININE 0.80 0.81 0.93  GLUCOSE 212* 203* 181*    Electrolytes  Recent Labs Lab 11/06/16 1632 11/07/16 0456 11/07/16 1622 11/07/16 1855 11/08/16 0444 11/09/16 0447  CALCIUM  --  8.6*  --  8.3* 8.5* 8.8*  MG 2.0 2.1 2.0  --   --  2.0  PHOS 2.6 1.6* 1.9*  --   --   --     CBC  Recent Labs Lab 11/07/16 0456 11/08/16 0444 11/09/16 0447  WBC 14.0* 15.1* 17.0*  HGB 12.8 12.5 12.3  HCT 41.2 39.8 38.9  PLT 197 211 193    Coag's No results for input(s): APTT, INR in the last 168 hours.  Sepsis Markers  Recent Labs Lab 11/04/16 0258 11/05/16 0344  11/05/16 2057 11/06/16 0230 11/06/16 0907  LATICACIDVEN  --   --   < > 4.1* 2.5* 2.2*  PROCALCITON 0.51 0.46  --   --  0.69  --   < > = values in this interval not displayed.  ABG  Recent Labs Lab 11/04/16 1047 11/05/16 0917 11/05/16 1833  PHART 7.255* 7.253* 7.442  PCO2ART 52.5* 68.1* 47.4  PO2ART 113.0* 79.9* 491.0*    Liver Enzymes  Recent Labs Lab 10/18/2016 1929  AST 50*  ALT 28  ALKPHOS 94  BILITOT 0.8  ALBUMIN 3.0*    Cardiac Enzymes No results for input(s): TROPONINI, PROBNP in  the last 168 hours.  Glucose  Recent Labs Lab 11/08/16 0746 11/08/16 1133 11/08/16 1528 11/08/16 1949 11/09/16 0000 11/09/16 0439  GLUCAP 184* 164* 138* 145* 207* 166*    Imaging No results found.   STUDIES:  CXR 5/29 > stable cardiomegaly and atherosclerosis, along with possible small right pneumothorax.  CT head 5/29 > No acute. Stable chronic microvascular ischemic changes and mild parenchymal volume loss  CT chest 5/29 > severe emphysema, Chest tube in place CXR 5/30 > worsening subcutaneous emphysema over the right hemithorax and base of the right neck CT Chest 6/1 > R chest tube with trace basilar PTX, extensive sub-q empysema, peribronchial soft tissue thickening with suspected post-obstructive opacity in the RUL, favor radiation changes, 6 mm LLL nodule ECHO 6/3 > EF 60-65,  G1DD  CULTURES: Blood 5/29 > Negative  Sputum 5/29 > neg MRSA PCR 5/29 > Negative   ANTIBIOTICS: Ceftriaxone 5/29 > 6/1 Ceftazidime 6/1 >>   SIGNIFICANT EVENTS: 5/29  Admit 5/30  Extubated 5/31  Re intubated   LINES/TUBES: ETT 5/29 > 5/30 ETT 5/31 >> R Pigtail CT 5/29 >>   DISCUSSION: 82 y.o. female with hx O2 dependent COPD and remote RUL lung CA, admitted 5/29 with acute hypercarbic respiratory failure and AECOPD requiring intubation.  ASSESSMENT / PLAN:  PULMONARY A: Acute hypoxic respiratory failure +AECOPD, +/-HCAP Severe emphysema  R pneumothorax H/O RUL lung cancer s/p chemo/rad and LLL Pulmonary Nodule P:   Vent Support - PRVC 8 cc/kg Wean as tolerated > passed wean 6/3, weaning now. > will assess for extubation  Continue Brovana + Pulmicort BID  Solumedrol 40 Q12 Trend CXR   CARDIOVASCULAR A:  A.Fib RVR - overnight 6/3, converted with amio > Resolved  H/O HTN P:  Cardiology Following  Cardiac Monitoring  Continue oral Diltiazem  ??amiodarone gtt - began 6/3 > past 24 hour period > will discuss d/c due to lung history  Continue ASA and Lopressor   RENAL A:   No acute issues P:   Trend BMP and U/O Replace electrolytes as indicated  GASTROINTESTINAL A:   GI prophylaxis Nutrition P:   SUP: protonix TF per Nutrition   HEMATOLOGIC A:   VTE Prophylaxis  Leukocytosis  P:  SCD's / Heparin  Trend CBC   INFECTIOUS A:   AECOPD +/- RLL HCAP P:   Trend WBC and Fever Curve  Continue Fortaz    ENDOCRINE A:   Hyperthyroidism   Hyperglycemia  P:   Continue Methimazole  Trend Glucose  SSI  NEUROLOGIC A:   Acute hypercarbic encephalopathy - Resolved  Agitation Deconditioning  P:   RASS Goal: 0 to -1  Wean Fentanyl gtt to achieve RASS  Daily WUA  High risk for delirium > promote sleep / wake cycle  PT when able / Passive ROM    Family updated:  Niece updated at bedside am 6/4  Interdisciplinary Family Meeting v Palliative  Care Meeting:  Ongoing.   CC Time: 30 minutes  [name], AGACNP-BC [facility]  Pgr: [phone]  PCCM Pgr: [phone]

## 2016-11-09 NOTE — Care Management Note (Signed)
Case Management Note Marvetta Gibbons RN, BSN Unit 2W-Case Manager-- [place] coverage [phone]  Patient Details  Name: Susan Garrett MRN: [id number] Date of Birth: 1934/10/18  Subjective/Objective:   Pt admitted with resp. Distress/failure- intubated- extubated 11/09/16                 Action/Plan: PTA pt lived at home- hx of COPD- on home 02- CM will follow for d/c needs  Expected Discharge Date:                  Expected Discharge Plan:     In-House Referral:     Discharge planning Services  CM Consult  Post Acute Care Choice:    Choice offered to:     DME Arranged:    DME Agency:     HH Arranged:    HH Agency:     Status of Service:  In process, will continue to follow  If discussed at Long Length of Stay Meetings, dates discussed:    Discharge Disposition:   Additional Comments:  Dawayne Patricia, RN 11/09/2016, 12:23 PM

## 2016-11-09 NOTE — Progress Notes (Signed)
Dr.Sommer made aware of patients nausea and small amount of emesis of a yellowish color, patient states she just doesn't feel good, abdomen is distended and tight, patient states no BM in days. Will give zofran slowly as instructed and monitor QTC. Continue to monitor closely

## 2016-11-09 NOTE — Progress Notes (Addendum)
Progress Note  Patient Name: Susan Garrett Date of Encounter: 11/09/2016  Primary Cardiologist: new ([facility])  Subjective   Intubated.  Denies chest pain. Endorses palpitations.  Inpatient Medications    Scheduled Meds: . arformoterol  15 mcg Nebulization BID  . aspirin  81 mg Per Tube Daily  . budesonide (PULMICORT) nebulizer solution  0.5 mg Nebulization BID  . chlorhexidine gluconate (MEDLINE KIT)  15 mL Mouth Rinse BID  . Chlorhexidine Gluconate Cloth  6 each Topical Daily  . diltiazem  60 mg Oral Q6H  . heparin  5,000 Units Subcutaneous Q8H  . insulin aspart  0-15 Units Subcutaneous Q4H  . ipratropium-albuterol  3 mL Nebulization Q6H  . mouth rinse  15 mL Mouth Rinse 10 times per day  . methimazole  2.5 mg Per Tube Daily  . methylPREDNISolone (SOLU-MEDROL) injection  40 mg Intravenous Q12H  . metoprolol tartrate  2.5 mg Intravenous Q6H  . pantoprazole (PROTONIX) IV  40 mg Intravenous Q24H  . polyethylene glycol  17 g Oral Daily  . potassium chloride  20 mEq Per Tube Q4H   Continuous Infusions: . sodium chloride 10 mL/hr at 11/07/16 1510  . amiodarone 30 mg/hr (11/08/16 2304)  . cefTAZidime (FORTAZ)  IV Stopped (11/08/16 2113)  . feeding supplement (VITAL HIGH PROTEIN) 1,000 mL (11/08/16 2000)  . fentaNYL infusion INTRAVENOUS Stopped (11/09/16 0739)   PRN Meds: bisacodyl, docusate, fentaNYL, fentaNYL (SUBLIMAZE) injection, labetalol, levalbuterol, midazolam, sodium chloride flush   Vital Signs    Vitals:   11/09/16 0600 11/09/16 0725 11/09/16 0730 11/09/16 0731  BP: 121/77     Pulse: 80     Resp: 18     Temp:   97.6 F (36.4 C)   TempSrc:   Axillary   SpO2:  98%  98%  Weight:      Height:        Intake/Output Summary (Last 24 hours) at 11/09/16 0803 Last data filed at 11/09/16 0700  Gross per 24 hour  Intake          1908.43 ml  Output             1135 ml  Net           773.43 ml   Filed Weights   11/07/16 0500 11/08/16 0359 11/09/16 0449    Weight: 71.2 kg (156 lb 15.5 oz) 70.6 kg (155 lb 10.3 oz) 71.3 kg (157 lb 3 oz)    Telemetry    Episodes of MAT and atypical atrial flutter with rates as high as 163.  currently sinus tachycardia at 100 bpm.  - Personally Reviewed  ECG    11/08/16:  atrial flutter with variable ventricular response. Ventricular rate 154 bpm. - Personally Reviewed  Physical Exam   GEN: Intubated. Awake and in no acute distress.   Neck: No JVD Cardiac: Tachycardic.  Regular rate.   No murmurs, rubs, or gallops.  Respiratory: Clear to auscultation bilaterally on anterior exam GI: Soft, nontender, non-distended.  Ventral hernia MS: No edema; No deformity. Neuro:  Nonfocal  Psych: Normal affect   Labs    Chemistry Recent Labs Lab 10/06/2016 1929  11/07/16 1855 11/08/16 0444 11/09/16 0447  NA 143  < > 139 136 138  K 4.3  < > 3.2* 3.8 3.6  CL 108  < > 102 97* 99*  CO2 30  < > '28 26 31  '$ GLUCOSE 209*  < > 212* 203* 181*  BUN 14  < > 31*  32* 40*  CREATININE 0.89  < > 0.80 0.81 0.93  CALCIUM 8.4*  < > 8.3* 8.5* 8.8*  PROT 5.6*  --   --   --   --   ALBUMIN 3.0*  --   --   --   --   AST 50*  --   --   --   --   ALT 28  --   --   --   --   ALKPHOS 94  --   --   --   --   BILITOT 0.8  --   --   --   --   GFRNONAA 60*  < > >60 >60 57*  GFRAA >60  < > >60 >60 >60  ANIONGAP 5  < > '9 13 8  '$ < > = values in this interval not displayed.   Hematology Recent Labs Lab 11/07/16 0456 11/08/16 0444 11/09/16 0447  WBC 14.0* 15.1* 17.0*  RBC 4.87 4.79 4.66  HGB 12.8 12.5 12.3  HCT 41.2 39.8 38.9  MCV 84.6 83.1 83.5  MCH 26.3 26.1 26.4  MCHC 31.1 31.4 31.6  RDW 15.9* 15.7* 15.8*  PLT 197 211 193    Cardiac EnzymesNo results for input(s): TROPONINI in the last 168 hours.  Recent Labs Lab 10/25/2016 2012  TROPIPOC 0.00     BNP Recent Labs Lab 10/12/2016 1929  BNP 106.8*     DDimer No results for input(s): DDIMER in the last 168 hours.   Radiology    Dg Chest Port 1 View  Result Date:  11/09/2016 CLINICAL DATA:  Respiratory failure. EXAM: PORTABLE CHEST 1 VIEW COMPARISON:  11/08/2016.  11/08/2016.  CT 11/06/2016. FINDINGS: Endotracheal tube tip noted at the orifice of the right mainstem bronchus. Proximal repositioning of approximately 2 cm suggested. Interim placement of PICC line, its tip is projected over the right atrium. NG tube and right chest tube in stable position. No pneumothorax. Right hilar density right upper lobe atelectatic changes and/or scarring again noted. Improved aeration of the lung bases. No significant tiny left pleural effusion cannot be excluded. Extensive chest wall subcutaneous emphysema again noted. IMPRESSION: 1. Endotracheal tube tip noted at the or persistent right mainstem bronchus. Proximal repositioning of approximately 2 cm suggested. 2. Interim placement of PICC line, its tip is noted over the right atrium. NG tube and right chest tube in stable position. No pneumothorax. Extensive chest wall subcutaneous emphysema again noted. 3. Persistent right perihilar density. Persistent right upper lobe atelectasis and or scarring. Improved aeration both lung bases. Tiny left pleural effusion cannot be excluded . Critical Value/emergent results were called by telephone at the time of interpretation on 11/09/2016 at 7:02 am to [place], who verbally acknowledged these results. Electronically Signed   By: Marcello Moores  Register   On: 11/09/2016 07:03   Dg Chest Port 1 View  Result Date: 11/08/2016 CLINICAL DATA:  Followup chest 2.  Respiratory failure with hypoxia. EXAM: PORTABLE CHEST 1 VIEW COMPARISON:  11/07/2016 FINDINGS: Endotracheal tube tip is 3 cm above the carina. Nasogastric tube enters stomach. Right chest tube remains in place. Slightly worsened volume loss in the left lower lobe. Extensive persistent subcutaneous emphysema on the right. No right pneumothorax identifiable, though a small pneumothorax may be inapparent. Right hilar prominence and right upper lobe  density as seen previously. IMPRESSION: Slight worsened volume loss in the left lower lobe. Lines and tubes well positioned. No visible pneumothorax. Extensive subcutaneous emphysema as seen previously. Right hilar prominence and  right upper lobe density as seen previously. Electronically Signed   By: Nelson Chimes M.D.   On: 11/08/2016 07:48    Cardiac Studies   Echo 11/08/16: Study Conclusions  - Left ventricle: The cavity size was normal. Wall thickness was   normal. Systolic function was normal. The estimated ejection   fraction was in the range of 60% to 65%. Doppler parameters are   consistent with abnormal left ventricular relaxation (grade 1   diastolic dysfunction). - Mitral valve: Calcified annulus.  Impressions:  - Technically poor study, even with Definity contrast.   Patient Profile     82 y.o. female with hypertension, COPD on home O2, R lung cancer s/p XRT and chemo in 2016, and hypothyroidism admitted 5/29 with acute on chronic respiratory failure and R pneumothorax.  Cardiology was consulted to atrial arrhythymias and found to have PAT, MAP, atrial flutter and possible atrial fibrillation.  Assessment & Plan    # PAT/MAT:  Ms. Poliquin continues to have episodes of MAT.  We will increase diltiazem to 90 mg q6h.  # Atrial flutter: Ms. Bambach again went into atrial flutter with poorly controlled rates on 6/3 and converted with an amiodarone infusion.  Increase diltiazem to '90mg'$  q6h.  Would consider anticoagulation if OK with primary team given her chest tube.  # Acute on chronic respiratory failure: # COPD: Vent, steroid and antibiotics per primary team.    Time spent: 45 minutes-Greater than 50% of this time was spent in counseling, explanation of diagnosis, planning of further management, and coordination of care.   Signed, Skeet Latch, MD  11/09/2016, 8:03 AM

## 2016-11-09 NOTE — Progress Notes (Signed)
Pharmacy Antibiotic Note  Susan Garrett is a 82 y.o. female admitted on 10/18/2016  Pharmacy has been consulted for Fortaz dosing for RLL PNA.  Plan: Continue Fortaz 2g IV Q12H.  Height: 5\' 8"  (172.7 cm) Weight: 157 lb 3 oz (71.3 kg) IBW/kg (Calculated) : 63.9  Temp (24hrs), Avg:98.6 F (37 C), Min:97.6 F (36.4 C), Max:99.9 F (37.7 C)   Recent Labs Lab 11/05/16 0344 11/05/16 1232 11/05/16 1830 11/05/16 2057 11/06/16 0230 11/06/16 0907 11/07/16 0456 11/07/16 1855 11/08/16 0444 11/09/16 0447  WBC 22.6*  --   --   --  17.5*  --  14.0*  --  15.1* 17.0*  CREATININE 0.71 0.83  --   --  1.12*  --  0.92 0.80 0.81 0.93  LATICACIDVEN  --  1.1 2.2* 4.1* 2.5* 2.2*  --   --   --   --     Estimated Creatinine Clearance: 48.7 mL/min (by C-G formula based on SCr of 0.93 mg/dL).    Allergies  Allergen Reactions  . Fish Allergy Anaphylaxis    Patient "cannot move" (also)  . Shellfish Allergy Anaphylaxis  . Latex Rash  . Tape Rash  Antimicrobials this admission:  CTX 5/29>6/1 Ceftaz 6/1>  Dose adjustments this admission:    Microbiology results:  5/29 BCx2: neg 5/29 MRSA PCR: neg 5/30 TA: norm flora   Thank you for allowing pharmacy to be a part of this patient's care.  Uvaldo Rising, BCPS  Clinical Pharmacist Pager [phone]  11/09/2016 12:55 PM

## 2016-11-09 NOTE — Procedures (Signed)
Extubation Procedure Note  Patient Details:   Name: Susan Garrett DOB: 05-Jan-1935 MRN: [id number]   Airway Documentation:  Airway 7.5 mm (Active)  Secured at (cm) 24 cm 11/09/2016  7:31 AM  Measured From Lips 11/09/2016  8:00 AM  [place] 11/09/2016  8:00 AM  Secured By [employer] 11/09/2016  8:00 AM  Tube Holder Repositioned Yes 11/09/2016  8:00 AM  Cuff Pressure (cm H2O) 24 cm H2O 11/09/2016  7:31 AM  Site Condition Dry 11/09/2016  8:00 AM    Evaluation  O2 sats: stable throughout and currently acceptable Complications: No apparent complications Patient did tolerate procedure well. Bilateral Breath Sounds: Clear, Diminished   Yes   Prior to extubation: no cuff leak noted.  Louretta Parma, NP made aware and she spoke with Dr. Halford Chessman.  Per Dr. Halford Chessman pt on solumedrol and ok to proceed with extubation.  Pt tolerated extubation well and able to speak post-procedure.  Miquel Dunn 11/09/2016, 11:24 AM

## 2016-11-09 NOTE — Progress Notes (Signed)
100 cc of Fentanyl wasted in sink with Joseph Berkshire, RN at this time

## 2016-11-09 NOTE — Progress Notes (Signed)
[place] Progress Note Patient Name: Susan Garrett DOB: 26-Apr-1935 MRN: [id number]   Date of Service  11/09/2016  HPI/Events of Note  Prn versed  eICU Interventions       Intervention Category Minor Interventions: Agitation / anxiety - evaluation and management  FEINSTEIN,DANIEL J. 11/09/2016, 1:16 AM

## 2016-11-09 NOTE — Progress Notes (Signed)
eLink Physician-Brief Progress Note Patient Name: Susan Garrett DOB: 14-Jan-1935 MRN: [id number]   Date of Service  11/09/2016  HPI/Events of Note  N/V - Request for Zofran. QTc interval = 0.46 seconds.   eICU Interventions  Will order: 1. Zofran 4 mg IV Q 8 hours PRN N/V. 2. Monitor QTc interval Q 6 hours. Notify MD if QTc interval > 500 milliseconds.      Intervention Category Major Interventions: Other:  Lysle Dingwall 11/09/2016, 10:53 PM

## 2016-11-10 ENCOUNTER — Telehealth: Payer: Self-pay | Admitting: Internal Medicine

## 2016-11-10 ENCOUNTER — Inpatient Hospital Stay (HOSPITAL_COMMUNITY): Payer: Medicare Other

## 2016-11-10 ENCOUNTER — Encounter (HOSPITAL_COMMUNITY): Payer: Self-pay | Admitting: *Deleted

## 2016-11-10 DIAGNOSIS — I483 Typical atrial flutter: Secondary | ICD-10-CM

## 2016-11-10 DIAGNOSIS — R140 Abdominal distension (gaseous): Secondary | ICD-10-CM

## 2016-11-10 LAB — GLUCOSE, CAPILLARY
GLUCOSE-CAPILLARY: 124 mg/dL — AB (ref 65–99)
GLUCOSE-CAPILLARY: 70 mg/dL (ref 65–99)
GLUCOSE-CAPILLARY: 127 mg/dL — AB (ref 65–99)
GLUCOSE-CAPILLARY: 104 mg/dL — AB (ref 65–99)
Glucose-Capillary: 157 mg/dL — ABNORMAL HIGH (ref 65–99)
Glucose-Capillary: 93 mg/dL (ref 65–99)

## 2016-11-10 LAB — BASIC METABOLIC PANEL
ANION GAP: 10 (ref 5–15)
BUN: 47 mg/dL — AB (ref 6–20)
CHLORIDE: 95 mmol/L — AB (ref 101–111)
CO2: 34 mmol/L — ABNORMAL HIGH (ref 22–32)
Calcium: 9.1 mg/dL (ref 8.9–10.3)
Creatinine, Ser: 1.06 mg/dL — ABNORMAL HIGH (ref 0.44–1.00)
GFR calc Af Amer: 56 mL/min — ABNORMAL LOW (ref >60)
GFR, EST NON AFRICAN AMERICAN: 48 mL/min — AB (ref >60)
GLUCOSE: 178 mg/dL — AB (ref 65–99)
POTASSIUM: 4.6 mmol/L (ref 3.5–5.1)
Sodium: 139 mmol/L (ref 135–145)

## 2016-11-10 LAB — BLOOD GAS, ARTERIAL
Acid-Base Excess: 8 mmol/L — ABNORMAL HIGH (ref 0.0–2.0)
Bicarbonate: 33 mmol/L — ABNORMAL HIGH (ref 20.0–28.0)
DRAWN BY: 418751
O2 Content: 2 L/min
O2 Saturation: 95.6 %
PATIENT TEMPERATURE: 98.6
pCO2 arterial: 55.4 mmHg — ABNORMAL HIGH (ref 32.0–48.0)
pH, Arterial: 7.393 (ref 7.350–7.450)
pO2, Arterial: 82.7 mmHg — ABNORMAL LOW (ref 83.0–108.0)

## 2016-11-10 LAB — MAGNESIUM: Magnesium: 2.1 mg/dL (ref 1.7–2.4)

## 2016-11-10 LAB — CBC
HEMATOCRIT: 40.7 % (ref 36.0–46.0)
HEMOGLOBIN: 12.6 g/dL (ref 12.0–15.0)
MCH: 26.2 pg (ref 26.0–34.0)
MCHC: 31 g/dL (ref 30.0–36.0)
MCV: 84.6 fL (ref 78.0–100.0)
Platelets: 215 10*3/uL (ref 150–400)
RBC: 4.81 MIL/uL (ref 3.87–5.11)
RDW: 15.7 % — AB (ref 11.5–15.5)
WBC: 21.2 10*3/uL — ABNORMAL HIGH (ref 4.0–10.5)

## 2016-11-10 LAB — POCT I-STAT 3, ART BLOOD GAS (G3+)
ACID-BASE EXCESS: 8 mmol/L — AB (ref 0.0–2.0)
Bicarbonate: 35.9 mmol/L — ABNORMAL HIGH (ref 20.0–28.0)
O2 SAT: 96 %
TCO2: 38 mmol/L (ref 0–100)
pCO2 arterial: 62 mmHg — ABNORMAL HIGH (ref 32.0–48.0)
pH, Arterial: 7.372 (ref 7.350–7.450)
pO2, Arterial: 86 mmHg (ref 83.0–108.0)

## 2016-11-10 LAB — LACTIC ACID, PLASMA: Lactic Acid, Venous: 1 mmol/L (ref 0.5–1.9)

## 2016-11-10 LAB — PHOSPHORUS: PHOSPHORUS: 4.7 mg/dL — AB (ref 2.5–4.6)

## 2016-11-10 LAB — LACTATE DEHYDROGENASE: LDH: 320 U/L — ABNORMAL HIGH (ref 98–192)

## 2016-11-10 MED ORDER — HYDRALAZINE HCL 20 MG/ML IJ SOLN
5.0000 mg | Freq: Three times a day (TID) | INTRAMUSCULAR | Status: DC
  Administered 2016-11-10 – 2016-11-11 (×3): 5 mg via INTRAVENOUS
  Filled 2016-11-10 (×5): qty 1

## 2016-11-10 MED ORDER — METHYLPREDNISOLONE SODIUM SUCC 40 MG IJ SOLR
20.0000 mg | Freq: Two times a day (BID) | INTRAMUSCULAR | Status: DC
  Administered 2016-11-10 – 2016-11-11 (×3): 20 mg via INTRAVENOUS
  Filled 2016-11-10 (×3): qty 1

## 2016-11-10 MED ORDER — AMIODARONE HCL IN DEXTROSE 360-4.14 MG/200ML-% IV SOLN
30.0000 mg/h | INTRAVENOUS | Status: DC
  Administered 2016-11-11: 30 mg/h via INTRAVENOUS
  Filled 2016-11-10: qty 200

## 2016-11-10 MED ORDER — ORAL CARE MOUTH RINSE
15.0000 mL | Freq: Two times a day (BID) | OROMUCOSAL | Status: DC
  Administered 2016-11-10: 15 mL via OROMUCOSAL

## 2016-11-10 MED ORDER — AMIODARONE HCL IN DEXTROSE 360-4.14 MG/200ML-% IV SOLN
INTRAVENOUS | Status: AC
  Filled 2016-11-10: qty 200

## 2016-11-10 MED ORDER — AMIODARONE LOAD VIA INFUSION
150.0000 mg | Freq: Once | INTRAVENOUS | Status: AC
  Administered 2016-11-10: 150 mg via INTRAVENOUS
  Filled 2016-11-10: qty 83.34

## 2016-11-10 MED ORDER — SODIUM CHLORIDE 0.9 % IV SOLN
0.2000 ug/kg/h | INTRAVENOUS | Status: DC
  Administered 2016-11-10 – 2016-11-11 (×2): 0.2 ug/kg/h via INTRAVENOUS
  Administered 2016-11-11: 0.4 ug/kg/h via INTRAVENOUS
  Administered 2016-11-11: 0.6 ug/kg/h via INTRAVENOUS
  Filled 2016-11-10 (×4): qty 2

## 2016-11-10 MED ORDER — FENTANYL CITRATE (PF) 100 MCG/2ML IJ SOLN
12.5000 ug | INTRAMUSCULAR | Status: DC | PRN
  Administered 2016-11-10: 12.5 ug via INTRAVENOUS
  Filled 2016-11-10: qty 2

## 2016-11-10 MED ORDER — IPRATROPIUM-ALBUTEROL 0.5-2.5 (3) MG/3ML IN SOLN
3.0000 mL | RESPIRATORY_TRACT | Status: DC | PRN
  Administered 2016-11-10: 3 mL via RESPIRATORY_TRACT
  Filled 2016-11-10: qty 3

## 2016-11-10 MED ORDER — AMIODARONE HCL IN DEXTROSE 360-4.14 MG/200ML-% IV SOLN
60.0000 mg/h | INTRAVENOUS | Status: AC
  Administered 2016-11-10: 60 mg/h via INTRAVENOUS
  Filled 2016-11-10: qty 200

## 2016-11-10 MED ORDER — CHLORHEXIDINE GLUCONATE 0.12 % MT SOLN
15.0000 mL | Freq: Two times a day (BID) | OROMUCOSAL | Status: DC
  Administered 2016-11-10 – 2016-11-11 (×2): 15 mL via OROMUCOSAL
  Filled 2016-11-10 (×2): qty 15

## 2016-11-10 NOTE — Progress Notes (Signed)
PULMONARY / CRITICAL CARE MEDICINE   Name: Susan Garrett MRN: [id number] DOB: November 28, 1934    ADMISSION DATE:  10/30/2016 CONSULTATION DATE:  10/27/2016  REFERRING MD:  Ralene Bathe  CHIEF COMPLAINT:  SOB  HPI: 82 yo female brought to ER with dyspnea and hypoxia with altered mental status, and required intubation.  She was noted to also have Rt PTX.  She has PMHx of COPD on chronic 2L O2 (followed by Dr. Melvyn Novas) and  RUL lung CA s/p chemoradiation with chemotherapy in 2016 (followed by Dr. Earlie Server).  SUBJECTIVE:   Developed N/V and abdominal distention.  NG tube placed with 800 ml of fluid out.  VITAL SIGNS: BP (!) 165/95   Pulse 98   Temp 98.7 F (37.1 C) (Oral)   Resp 15   Ht 5\' 8"  (1.727 m)   Wt 158 lb 4.6 oz (71.8 kg)   SpO2 99%   BMI 24.07 kg/m   INTAKE / OUTPUT: I/O last 3 completed shifts: In: 2245 [I.V.:1225; NG/GT:870; IV Piggyback:150] Out: 1779 [Urine:985; Emesis/NG output:800]   PHYSICAL EXAMINATION:  General - mildly confused Eyes - pupils reactive ENT - NG tube in place Cardiac - regular, no murmur Chest - no wheeze, rales, Rt chest tube Abd - umbilical hernia, mild tenderness, distended, increased tympany Ext - no edema Skin - no rashes Neuro - normal strength    LABS:  BMET  Recent Labs Lab 11/08/16 0444 11/09/16 0447 11/10/16 0415  NA 136 138 139  K 3.8 3.6 4.6  CL 97* 99* 95*  CO2 26 31 34*  BUN 32* 40* 47*  CREATININE 0.81 0.93 1.06*  GLUCOSE 203* 181* 178*    Electrolytes  Recent Labs Lab 11/07/16 0456 11/07/16 1622  11/08/16 0444 11/09/16 0447 11/10/16 0415  CALCIUM 8.6*  --   < > 8.5* 8.8* 9.1  MG 2.1 2.0  --   --  2.0 2.1  PHOS 1.6* 1.9*  --   --   --  4.7*  < > = values in this interval not displayed.  CBC  Recent Labs Lab 11/08/16 0444 11/09/16 0447 11/10/16 0415  WBC 15.1* 17.0* 21.2*  HGB 12.5 12.3 12.6  HCT 39.8 38.9 40.7  PLT 211 193 215    Coag's No results for input(s): APTT, INR in the last 168  hours.  Sepsis Markers  Recent Labs Lab 11/04/16 0258 11/05/16 0344  11/06/16 0230 11/06/16 0907 11/10/16 0430  LATICACIDVEN  --   --   < > 2.5* 2.2* 1.0  PROCALCITON 0.51 0.46  --  0.69  --   --   < > = values in this interval not displayed.  ABG  Recent Labs Lab 11/05/16 0917 11/05/16 1833 11/10/16 0402  PHART 7.253* 7.442 7.393  PCO2ART 68.1* 47.4 55.4*  PO2ART 79.9* 491.0* 82.7*    Liver Enzymes  Recent Labs Lab 10/22/2016 1929  AST 50*  ALT 28  ALKPHOS 94  BILITOT 0.8  ALBUMIN 3.0*    Cardiac Enzymes No results for input(s): TROPONINI, PROBNP in the last 168 hours.  Glucose  Recent Labs Lab 11/09/16 0727 11/09/16 1124 11/09/16 1700 11/09/16 1927 11/09/16 2313 11/10/16 0311  GLUCAP 143* 131* 155* 105* 129* 157*    Imaging Dg Abd Portable 1v  Result Date: 11/10/2016 CLINICAL DATA:  Acute ileus, abdominal pain EXAM: PORTABLE ABDOMEN - 1 VIEW COMPARISON:  11/06/2016 KUB, CT 10/10/2016 FINDINGS: Subcutaneous emphysema along the visualized right hemiabdomen secondary to reported right pneumothorax. Scattered moderate gaseous distention of small  and large bowel loops in a pattern consistent with ileus. There is however a segment of bowel that projects over the expected location of the right inguinal region and the possibility of the inguinal hernia is not entirely excluded. There is presence of a right-sided inguinal hernia on recent CT. IMPRESSION: 1. Dilated small bowel loops suspicious for small bowel ileus or evolving small bowel obstruction. Small segment of bowel is seen projecting over the right inguinal region. Herniation of small bowel is not entirely excluded. 2. Subcutaneous emphysema tracking from known right pneumothorax. Electronically Signed   By: Ashley Royalty M.D.   On: 11/10/2016 03:10     STUDIES:  CT head 5/29 > No acute. Stable chronic microvascular ischemic changes and mild parenchymal volume loss  CT chest 5/29 > severe emphysema, Chest  tube in place CT Chest 6/1 > R chest tube with trace basilar PTX, extensive sub-q empysema, peribronchial soft tissue thickening with suspected post-obstructive opacity in the RUL, favor radiation changes, 6 mm LLL nodule ECHO 6/3 > EF 60-65, G1DD  CULTURES: Blood 5/29 > Negative  Sputum 5/29 > neg MRSA PCR 5/29 > Negative   ANTIBIOTICS: Ceftriaxone 5/29 > 6/1 Ceftazidime 6/1 >>   SIGNIFICANT EVENTS: 5/29 Admit 5/30 Extubated 5/31 Re intubated  6/05 Abdominal distention  LINES/TUBES: ETT 5/29 > 5/30 ETT 5/31 >> 6/04 R Pigtail CT 5/29 >>   DISCUSSION: 82 yo female with hx O2 dependent COPD and remote RUL lung CA, admitted 5/29 with acute hypercarbic respiratory failure and AECOPD requiring intubation.  ASSESSMENT / PLAN:  Acute on chronic hypoxic/hypercapnic respiratory failure 2nd to AECOPD and HCAP. Spontaneous Rt PTX. - oxygen to keep SpO2 > 90% - f/u CXR - change chest tube to water seal 6/05 - continue brovana, pulmicort and prn duoneb - change solumedrol to 20 mg bid - day 8/10 of Abx, currently on fortaz  Abdominal distention 6/05. Hx of AAA. - NG tube to suction - CT abd/pelvis w/o contrast >> reports allergy and intolerance to contrast dye  A fib with RVR >> back in sinus rhythm. HTN. - d/c amiodarone - continue IV lopressor while NPO - hold ASA while NPO  Hx of hyperthyroidism. - hold methimazole while NPO  Steroid induced hyperglycemia. - SSI  Acute metabolic encephalopathy. - improved - monitor mental status  Deconditioning. - PT  DVT prophylaxis - SQ heparin SUP - Protonix Nutrition - NPO Goals of care - full code  Chesley Mires, MD [place] 11/10/2016, 7:17 AM Pager:  [phone] After 3pm call: [phone]

## 2016-11-10 NOTE — Progress Notes (Signed)
Dr.Feinstein called to look at xray results.

## 2016-11-10 NOTE — Progress Notes (Signed)
Pt has developed progressive respiratory distress.    BP 134/77   Pulse (!) 110   Temp 98.9 F (37.2 C) (Oral)   Resp (!) 23   Ht 5\' 8"  (1.727 m)   Wt 158 lb 4.6 oz (71.8 kg)   SpO2 98%   BMI 24.07 kg/m   Alert. Using accessory muscle.  Decreased BS.  HR irregular.  Abd soft, with hernia that is reducible.  1+ edema.  Ct Abdomen Pelvis Wo Contrast  Result Date: 11/10/2016 CLINICAL DATA:  Abdominal distention. EXAM: CT ABDOMEN AND PELVIS WITHOUT CONTRAST TECHNIQUE: Multidetector CT imaging of the abdomen and pelvis was performed following the standard protocol without IV contrast. COMPARISON:  10/11/2014 FINDINGS: Lower chest: Small bilateral pleural effusions are identified. There is extensive chest wall emphysema noted. Hepatobiliary: No focal liver abnormality. Stones identified within the gallbladder. No biliary dilatation. Pancreas: Calcification within the tail of pancreas noted. Spleen: Normal in size without focal abnormality. Adrenals/Urinary Tract: The adrenal glands appear normal. The kidneys are unremarkable. No mass or hydronephrosis. Urinary bladder is collapsed around a Foley catheter. Stomach/Bowel: The stomach appears scratch set nasogastric tube tip is in the body of the stomach. Small bowel loops measure up to 3.2 cm. Multiple small bowel air-fluid levels identified. Dilated loop of small bowel enters the ventral abdominal wall hernia. Distal small bowel loops have a normal caliber. The appendix is visualized and appears normal. Normal caliber of the colon. Vascular/Lymphatic: Aortic atherosclerosis. Infrarenal abdominal aortic aneurysm measures 4.2 cm, image 37 of series 3. No upper abdominal or pelvic adenopathy. Reproductive: Status post hysterectomy. No adnexal masses. Other: There is a small volume of ascites identified within the pelvis. There is extensive ventral abdominal wall and lateral abdominal wall subcutaneous emphysema. Several injection granulomas identified. 1.  Musculoskeletal: No aggressive lytic or sclerotic bone lesions. IMPRESSION: 1. Abnormal increase caliber of small bowel loops measuring up to 3.2 cm. There is a ventral abdominal wall hernia which contains dilated loops of small bowel. Cannot rule out partial obstruction at this level. No evidence for high-grade small bowel obstruction as there is gas identified distal to the hernia as well as throughout the colon. 2. Aortic atherosclerosis and infrarenal abdominal aortic aneurysm measuring 4.2 cm. 3. Small volume of pelvic ascites 4. Bilateral pleural effusions 5. Extensive chest wall and abdominal wall subcutaneous gas. These results will be called to the ordering clinician or representative by the Radiologist Assistant, and communication documented in the PACS or zVision Dashboard. Electronically Signed   By: Kerby Moors M.D.   On: 11/10/2016 12:33   Dg Chest Port 1 View  Result Date: 11/10/2016 CLINICAL DATA:  82 year old with a pigtail chest tube in place in the right pleural space for a spontaneous right pneumothorax on 10/11/2016, currently with subcutaneous emphysema, now with acutely labored breathing and chest congestion. EXAM: PORTABLE CHEST 1 VIEW 4:05 p.m.: COMPARISON:  Portable chest x-ray earlier today 5:45 a.m., 11/09/2016 and earlier. CT chest 11/06/2016, 10/31/2016, 05/19/2016. FINDINGS: Right pleural pigtail drainage catheter unchanged in position. No visible pneumothorax. Extensive subcutaneous emphysema involving the chest wall, abdominal wall, neck and right upper arm, unchanged. Scar/bronchiectasis involving the right upper lobe, unchanged dating back to December, 2017. Apparent patchy airspace opacities at the right lung base are artifactual and related to the subcutaneous emphysema in the anterior and posterior right chest wall. No new pulmonary parenchymal abnormalities. Nasogastric tube tip in the fundus of the stomach. Right arm PICC tip projects over the lower SVC, unchanged.  IMPRESSION:  1.  Support apparatus satisfactory. 2. No pneumothorax with right pleural pigtail drainage catheter in place. 3. No acute cardiopulmonary disease is suspected currently. Apparent airspace opacities at the right lung base are artifactual and felt to be related to the subcutaneous emphysema in the anterior and posterior chest wall. Electronically Signed   By: Evangeline Dakin M.D.   On: 11/10/2016 16:16   Dg Chest Port 1 View  Result Date: 11/10/2016 CLINICAL DATA:  Chest tube. EXAM: PORTABLE CHEST 1 VIEW COMPARISON:  11/09/2016 . FINDINGS: Interim extubation. Right PICC line, NG tube, chest tube in stable position. No pneumothorax. Heart size normal. Progressive right base infiltrate. Right perihilar/upper lobe atelectasis and/or scar. Mild left base atelectasis. No pneumothorax. Diffuse chest wall subcutaneous emphysema . IMPRESSION: 1. Interim extubation. Remaining lines and tubes including right chest tube in stable position. No pneumothorax. Extensive chest wall subcutaneous emphysema again noted. 2.  Progressive right base infiltrate. 3. Persistent right perihilar/ upper lobe atelectasis and/or scarring. Electronically Signed   By: Marcello Moores  Register   On: 11/10/2016 07:39   Dg Chest Port 1 View  Result Date: 11/09/2016 CLINICAL DATA:  Respiratory failure. EXAM: PORTABLE CHEST 1 VIEW COMPARISON:  11/08/2016.  11/08/2016.  CT 11/06/2016. FINDINGS: Endotracheal tube tip noted at the orifice of the right mainstem bronchus. Proximal repositioning of approximately 2 cm suggested. Interim placement of PICC line, its tip is projected over the right atrium. NG tube and right chest tube in stable position. No pneumothorax. Right hilar density right upper lobe atelectatic changes and/or scarring again noted. Improved aeration of the lung bases. No significant tiny left pleural effusion cannot be excluded. Extensive chest wall subcutaneous emphysema again noted. IMPRESSION: 1. Endotracheal tube tip noted at  the or persistent right mainstem bronchus. Proximal repositioning of approximately 2 cm suggested. 2. Interim placement of PICC line, its tip is noted over the right atrium. NG tube and right chest tube in stable position. No pneumothorax. Extensive chest wall subcutaneous emphysema again noted. 3. Persistent right perihilar density. Persistent right upper lobe atelectasis and or scarring. Improved aeration both lung bases. Tiny left pleural effusion cannot be excluded . Critical Value/emergent results were called by telephone at the time of interpretation on 11/09/2016 at 7:02 am to [place], who verbally acknowledged these results. Electronically Signed   By: Marcello Moores  Register   On: 11/09/2016 07:03   Dg Abd Portable 1v  Result Date: 11/10/2016 CLINICAL DATA:  Acute ileus, abdominal pain EXAM: PORTABLE ABDOMEN - 1 VIEW COMPARISON:  11/06/2016 KUB, CT 10/15/2016 FINDINGS: Subcutaneous emphysema along the visualized right hemiabdomen secondary to reported right pneumothorax. Scattered moderate gaseous distention of small and large bowel loops in a pattern consistent with ileus. There is however a segment of bowel that projects over the expected location of the right inguinal region and the possibility of the inguinal hernia is not entirely excluded. There is presence of a right-sided inguinal hernia on recent CT. IMPRESSION: 1. Dilated small bowel loops suspicious for small bowel ileus or evolving small bowel obstruction. Small segment of bowel is seen projecting over the right inguinal region. Herniation of small bowel is not entirely excluded. 2. Subcutaneous emphysema tracking from known right pneumothorax. Electronically Signed   By: Ashley Royalty M.D.   On: 11/10/2016 03:10    Assessment/plan:  Acute on chronic respiratory failure with COPD/emphysema. - she is developing respiratory fatigue again - she is willing to try Bipap and precedex - discussed possibility of reintubation, but then she would likely  need tracheostomy >> she wanted to d/w her niece Dr. Landry Mellow - also discussed whether she would want to just continue current therapies, but this would likely fail and she would then need to consider comfort measures  D/w Pt's Niece Dr. Landry Mellow over the phone.  Reviewed the above options.  She will d/w Pt later this evening and then notify medical team about plan of care.  Chesley Mires, MD [facility] Pulmonary/Critical Care 11/10/2016, 4:28 PM Pager:  [phone] After 3pm call: [phone]

## 2016-11-10 NOTE — Progress Notes (Signed)
Patients niece, Dr.Cole was made aware of situration of N/V, needing NG tube and the amount of secretions suctioned out.

## 2016-11-10 NOTE — Progress Notes (Signed)
[place] Progress Note Patient Name: Susan Garrett DOB: 1934-11-12 MRN: [id number]   Date of Service  11/10/2016  HPI/Events of Note  Respiratory Distress - c/o SOB and looks puny on video assessment. Sat = 97% and RR = 24.   eICU Interventions  Will order: 1. ABG STAT. 2. Portable CXR now. 3. Will ask ground team to evaluate at bedside.         Sommer,Steven Cornelia Copa 11/10/2016, 3:46 PM

## 2016-11-10 NOTE — Progress Notes (Signed)
eLink Physician-Brief Progress Note Patient Name: Susan Garrett DOB: 1934-10-18 MRN: [id number]   Date of Service  11/10/2016  HPI/Events of Note  Spoke with the patient's sister, Mendel Ryder, who is Ms. Dorlene Footman. Ms. Landry Mellow relates that after discussion with her sister that the patient does not desire escalation of care from present level (BiPAP), intubation, tracheostomy, CPR, ACLS drugs or cardioversion.   eICU Interventions  Will make the patient a partial code to include BiPAP only (which she is currently on). NO intubation, mechanical ventilation, CPR, Cardioversion or ACLS drugs.      Intervention Category Major Interventions: End of life / care limitation discussion  Lysle Dingwall 11/10/2016, 6:20 PM

## 2016-11-10 NOTE — Progress Notes (Signed)
Called ELink concerning pts respiratory status. Pt labored breathing, stating that she thinks she is dying. Sats 96 4L [place]. Hr 119, Bp 139 /84  Dr Felicity Coyer to send CCM team to assess.

## 2016-11-10 NOTE — Progress Notes (Signed)
Progress Note  Patient Name: Susan Garrett Date of Encounter: 11/10/2016  Primary Cardiologist: new ([facility])  Subjective   Feeling tired and weak. No specific complaints.  Inpatient Medications    Scheduled Meds: . arformoterol  15 mcg Nebulization BID  . aspirin  81 mg Oral Daily  . budesonide (PULMICORT) nebulizer solution  0.5 mg Nebulization BID  . Chlorhexidine Gluconate Cloth  6 each Topical Daily  . diltiazem  90 mg Oral Q6H  . heparin  5,000 Units Subcutaneous Q8H  . insulin aspart  0-15 Units Subcutaneous Q4H  . ipratropium-albuterol  3 mL Nebulization Q6H  . methimazole  2.5 mg Oral Daily  . methylPREDNISolone (SOLU-MEDROL) injection  40 mg Intravenous Q12H  . metoprolol tartrate  2.5 mg Intravenous Q6H  . pantoprazole (PROTONIX) IV  40 mg Intravenous Q24H  . polyethylene glycol  17 g Oral Daily   Continuous Infusions: . sodium chloride 10 mL/hr at 11/09/16 0800  . amiodarone 30 mg/hr (11/09/16 2238)  . cefTAZidime (FORTAZ)  IV Stopped (11/09/16 2045)   PRN Meds: bisacodyl, docusate, fentaNYL (SUBLIMAZE) injection, labetalol, levalbuterol, ondansetron (ZOFRAN) IV, sodium chloride flush   Vital Signs    Vitals:   11/10/16 0304 11/10/16 0400 11/10/16 0500 11/10/16 0600  BP:  (!) 168/95 (!) 165/95   Pulse:  96 98   Resp:  20 15   Temp:  98.7 F (37.1 C)    TempSrc:  Oral    SpO2: 95% 96%  99%  Weight:   71.8 kg (158 lb 4.6 oz)   Height:        Intake/Output Summary (Last 24 hours) at 11/10/16 0704 Last data filed at 11/10/16 0600  Gross per 24 hour  Intake          1111.48 ml  Output             1450 ml  Net          -338.52 ml   Filed Weights   11/08/16 0359 11/09/16 0449 11/10/16 0500  Weight: 70.6 kg (155 lb 10.3 oz) 71.3 kg (157 lb 3 oz) 71.8 kg (158 lb 4.6 oz)    Telemetry    No events.  Sinus rhythm.  - Personally Reviewed  ECG    11/08/16:  atrial flutter with variable ventricular response. Ventricular rate 154 bpm. - Personally  Reviewed  Physical Exam   GEN: No acute distress.  Face mask in place. Neck: No JVD Cardiac:   Regular rate and rhythm.   No murmurs, rubs, or gallops.  Respiratory: R chest tube.  No crackles, wheezes or rhonchi GI: Soft, mild, diffuse tenderness to palpation.  Distended. Ventral hernia MS: No edema; No deformity. Neuro:  Nonfocal  Psych: Normal affect   Labs    Chemistry Recent Labs Lab 10/06/2016 1929  11/08/16 0444 11/09/16 0447 11/10/16 0415  NA 143  < > 136 138 139  K 4.3  < > 3.8 3.6 4.6  CL 108  < > 97* 99* 95*  CO2 30  < > 26 31 34*  GLUCOSE 209*  < > 203* 181* 178*  BUN 14  < > 32* 40* 47*  CREATININE 0.89  < > 0.81 0.93 1.06*  CALCIUM 8.4*  < > 8.5* 8.8* 9.1  PROT 5.6*  --   --   --   --   ALBUMIN 3.0*  --   --   --   --   AST 50*  --   --   --   --  ALT 28  --   --   --   --   ALKPHOS 94  --   --   --   --   BILITOT 0.8  --   --   --   --   GFRNONAA 60*  < > >60 57* 48*  GFRAA >60  < > >60 >60 56*  ANIONGAP 5  < > 13 8 10   < > = values in this interval not displayed.   Hematology  Recent Labs Lab 11/08/16 0444 11/09/16 0447 11/10/16 0415  WBC 15.1* 17.0* 21.2*  RBC 4.79 4.66 4.81  HGB 12.5 12.3 12.6  HCT 39.8 38.9 40.7  MCV 83.1 83.5 84.6  MCH 26.1 26.4 26.2  MCHC 31.4 31.6 31.0  RDW 15.7* 15.8* 15.7*  PLT 211 193 215    Cardiac EnzymesNo results for input(s): TROPONINI in the last 168 hours.   Recent Labs Lab 10/22/2016 2012  TROPIPOC 0.00     BNP  Recent Labs Lab 10/08/2016 1929  BNP 106.8*     DDimer No results for input(s): DDIMER in the last 168 hours.   Radiology    Dg Chest Port 1 View  Result Date: 11/09/2016 CLINICAL DATA:  Respiratory failure. EXAM: PORTABLE CHEST 1 VIEW COMPARISON:  11/08/2016.  11/08/2016.  CT 11/06/2016. FINDINGS: Endotracheal tube tip noted at the orifice of the right mainstem bronchus. Proximal repositioning of approximately 2 cm suggested. Interim placement of PICC line, its tip is projected over  the right atrium. NG tube and right chest tube in stable position. No pneumothorax. Right hilar density right upper lobe atelectatic changes and/or scarring again noted. Improved aeration of the lung bases. No significant tiny left pleural effusion cannot be excluded. Extensive chest wall subcutaneous emphysema again noted. IMPRESSION: 1. Endotracheal tube tip noted at the or persistent right mainstem bronchus. Proximal repositioning of approximately 2 cm suggested. 2. Interim placement of PICC line, its tip is noted over the right atrium. NG tube and right chest tube in stable position. No pneumothorax. Extensive chest wall subcutaneous emphysema again noted. 3. Persistent right perihilar density. Persistent right upper lobe atelectasis and or scarring. Improved aeration both lung bases. Tiny left pleural effusion cannot be excluded . Critical Value/emergent results were called by telephone at the time of interpretation on 11/09/2016 at 7:02 am to [place], who verbally acknowledged these results. Electronically Signed   By: Marcello Moores  Register   On: 11/09/2016 07:03   Dg Abd Portable 1v  Result Date: 11/10/2016 CLINICAL DATA:  Acute ileus, abdominal pain EXAM: PORTABLE ABDOMEN - 1 VIEW COMPARISON:  11/06/2016 KUB, CT 10/21/2016 FINDINGS: Subcutaneous emphysema along the visualized right hemiabdomen secondary to reported right pneumothorax. Scattered moderate gaseous distention of small and large bowel loops in a pattern consistent with ileus. There is however a segment of bowel that projects over the expected location of the right inguinal region and the possibility of the inguinal hernia is not entirely excluded. There is presence of a right-sided inguinal hernia on recent CT. IMPRESSION: 1. Dilated small bowel loops suspicious for small bowel ileus or evolving small bowel obstruction. Small segment of bowel is seen projecting over the right inguinal region. Herniation of small bowel is not entirely excluded. 2.  Subcutaneous emphysema tracking from known right pneumothorax. Electronically Signed   By: Ashley Royalty M.D.   On: 11/10/2016 03:10    Cardiac Studies   Echo 11/08/16: Study Conclusions  - Left ventricle: The cavity size was normal. Wall thickness was  normal. Systolic function was normal. The estimated ejection   fraction was in the range of 60% to 65%. Doppler parameters are   consistent with abnormal left ventricular relaxation (grade 1   diastolic dysfunction). - Mitral valve: Calcified annulus.  Impressions:  - Technically poor study, even with Definity contrast.   Patient Profile     82 y.o. female with hypertension, COPD on home O2, R lung cancer s/p XRT and chemo in 2016, and hypothyroidism admitted 5/29 with acute on chronic respiratory failure and R pneumothorax.  Cardiology was consulted to atrial arrhythymias and found to have PAT, MAP, atrial flutter and possible atrial fibrillation.  Assessment & Plan    # PAT/MAT:  # Atrial flutter:  Diltiazem increased 11/09/16.  No recurrent atrial arrhythmias since yesterday. Likely related to her pulmonary disease.  She should at least be on aspirin.  Would give anticoagulation if she has a recurrent event.  We will stop IV amiodarone today.  She is not receiving oral medication. Continue IV metoprolol. If she has recurrent atrial flutter, would start heparin.   # Hypertension: BP poorly-controlled. We will add hydralazine 5mg  q8h.  Hold for SBP <130.  Continue IV metoprolol.  # Acute on chronic respiratory failure: # COPD: Extubated 6/4. Steroid and antibiotics per primary team.    Time spent: 45 minutes-Greater than 50% of this time was spent in counseling, explanation of diagnosis, planning of further management, and coordination of care.   Signed, Skeet Latch, MD  11/10/2016, 7:04 AM

## 2016-11-10 NOTE — Progress Notes (Signed)
eLink Physician-Brief Progress Note Patient Name: CAYLI ESCAJEDA DOB: 10-28-1934 MRN: [id number]   Date of Service  11/10/2016  HPI/Events of Note  Will have bedside assesment abdo Assess LDH, abg, lactic in meantime  eICU Interventions       Intervention Category Intermediate Interventions: Abdominal pain - evaluation and management  FEINSTEIN,DANIEL J. 11/10/2016, 3:52 AM

## 2016-11-10 NOTE — Progress Notes (Signed)
Dr.Feinstein called out of concern of increased distention/taughtness and tender in RUQ, has feeling of nausea again. Patient with no specific complaints yet overall doesn't feel well.

## 2016-11-10 NOTE — Progress Notes (Signed)
[facility] Progress Note Patient Name: Susan Garrett DOB: Oct 17, 1934 MRN: [id number]   Date of Service  11/10/2016  HPI/Events of Note  abdo distention, NO bm noted Xray assessment  eICU Interventions       Intervention Category Intermediate Interventions: Abdominal pain - evaluation and management  Raylene Miyamoto. 11/10/2016, 2:35 AM

## 2016-11-10 NOTE — Progress Notes (Signed)
eLink Physician-Brief Progress Note Patient Name: Susan Garrett DOB: 31-Jul-1934 MRN: [id number]   Date of Service  11/10/2016  HPI/Events of Note  Xray reviewed, her hernia is not red and is central, kub shows ileus vs obstruction, she has noT had a bm Will need provider to assess exam and determine if CT is needed She does not describe much pain. May need low dose fent. If for CT will need oral contrast Will have am doc assess exam to decide on CT if needed  eICU Interventions  Vitals wnl     Intervention Category Intermediate Interventions: Abdominal pain - evaluation and management  FEINSTEIN,DANIEL J. 11/10/2016, 3:32 AM

## 2016-11-10 NOTE — Progress Notes (Signed)
ABG ordered for patient due to patient having increased work of breathing.  ABG was obtained on 4L nasal cannula.  Patient tolerated well.  Will continue to monitor.   Ref. Range 11/10/2016 15:55  Sample type Unknown ARTERIAL  pH, Arterial Latest Ref Range: 7.350 - 7.450  7.372  pCO2 arterial Latest Ref Range: 32.0 - 48.0 mmHg 62.0 (H)  pO2, Arterial Latest Ref Range: 83.0 - 108.0 mmHg 86.0  TCO2 Latest Ref Range: 0 - 100 mmol/L 38  Acid-Base Excess Latest Ref Range: 0.0 - 2.0 mmol/L 8.0 (H)  Bicarbonate Latest Ref Range: 20.0 - 28.0 mmol/L 35.9 (H)  O2 Saturation Latest Units: % 96.0  Patient temperature Unknown 99.1 F  Collection site Unknown RADIAL, [facility] T.Marland KitchenMarland Kitchen

## 2016-11-10 NOTE — Progress Notes (Signed)
PCCM INTERVAL PROGRESS NOTE   Called to bedside to assess abd pain  States she feels like she needs to have BM, but cant. Hasn't has one for several days. Abdomen appears distended. Reducable hernia noted. Abd distended/somewhat firm. Not exquisitely tender. Hurts all over abdomen, no specific painful areas. KUB reviewed and demonstrates dilated bowel loops concerning for ileus vs obstruction. This is new from KUB 6/1.  Plan: Place NGT to LIS to decompress If pain/abd distension not improved, may need CT NPO  Georgann Housekeeper, AGACNP-BC [facility] Pulmonology/Critical Care Pager [phone] or [phone]  11/10/2016 5:03 AM

## 2016-11-10 NOTE — Telephone Encounter (Signed)
Cancelled lab appt per patient.

## 2016-11-10 NOTE — Consult Note (Signed)
[facility] Surgery Consult Note  Susan Garrett [birth year]  [id number].    Requesting MD: Nelda Marseille Chief Complaint/Reason for Consult: partial SBO  HPI:  Susan Garrett is an 82yo female who was admitted to [facility] 10/13/2016 with acute on chronic hypoxic/hypercapnic respiratory failure secondary to HCAP, COPD, and spontaneous pneumothorax. Patient did require intubation and is currently extubated and on solumedrol and fortaz. Patient began complaining of abdominal distension, nausea, and vomiting yesterday. She has had no recorded BM since admission. NG tube was placed with 886m fluid output. CT scan was performed and showed a ventral abdominal wall hernia containing dilated loops of small bowel with a possible partial SBO. General surgery aspect to consult. Currently patient is complaining of shortness of breath and fatigue. She reports minimal abdominal pain, but she does still feel distended. No BM or flatus. Nausea/emesis resolved with NG tube.   PMH significant for COPD on chronic 2L O2 (followed by Dr. WMelvyn Novas, RUL lung CA s/p chemoradiation with chemotherapy in 2016 (followed by Dr. MEarlie Server, HTN, Hyperthyroidism Abdominal surgical history: hysterectomy  ROS: Review of Systems  Constitutional: Positive for malaise/fatigue.  HENT: Negative.   Eyes: Negative.   Respiratory: Positive for shortness of breath.   Cardiovascular: Positive for leg swelling.  Gastrointestinal: Positive for abdominal pain, constipation, nausea and vomiting. Negative for diarrhea and heartburn.  Genitourinary: Negative.   Musculoskeletal: Positive for joint pain.  Skin: Negative.   Neurological: Positive for weakness.  All systems reviewed and otherwise negative except for as above  Family History  Problem Relation Age of Onset  . Heart disease Father   . Asthma Father   . Cancer Mother        gastric  . Cancer Brother        lung    Past Medical History:  Diagnosis Date  . Callus   . Corns  and callosities   . Encounter for antineoplastic chemotherapy 11/13/2014  . Encounter for antineoplastic chemotherapy 11/13/2014  . Hypertension   . Hyperthyroidism   . Paroxysmal tachycardia ([place] 11/06/2016    Past Surgical History:  Procedure Laterality Date  . LESION REMOVAL Left 11/12/87  . VAGINAL HYSTERECTOMY    . VIDEO BRONCHOSCOPY Bilateral 10/18/2014   Procedure: VIDEO BRONCHOSCOPY WITH FLUORO;  Surgeon: MTanda Rockers MD;  Location: [facility];  Service: Endoscopy;  Laterality: Bilateral;    Social History:  reports that she quit smoking about 2 years ago. Her smoking use included Cigarettes. She has a 27.50 pack-year smoking history. She has never used smokeless tobacco. She reports that she does not drink alcohol or use drugs.  Allergies:  Allergies  Allergen Reactions  . Fish Allergy Anaphylaxis    Patient "cannot move" (also)  . Shellfish Allergy Anaphylaxis  . Latex Rash  . Tape Rash    Medications Prior to Admission  Medication Sig Dispense Refill  . aspirin 81 MG tablet Take 81 mg by mouth daily.    . Glycopyrrolate-Formoterol (BEVESPI AEROSPHERE) 9-4.8 MCG/ACT AERO Inhale 2 puffs into the lungs 2 (two) times daily. 1 Inhaler 11  . methimazole (TAPAZOLE) 5 MG tablet Take 5 mg by mouth daily.   2  . metoprolol succinate (TOPROL-XL) 25 MG 24 hr tablet Take 25 mg by mouth daily.     . OXYGEN Inhale 2 L into the lungs continuous.    . polyethylene glycol (MIRALAX / GLYCOLAX) packet Take 17 g by mouth daily.    .Marland KitchenPROAIR HFA 108 (90 Base) MCG/ACT inhaler Inhale 2  puffs into the lungs every 4 (four) hours as needed for wheezing or shortness of breath.     . Fluocinonide 0.1 % CREA Use as directed  2  . sennosides-docusate sodium (SENOKOT-S) 8.6-50 MG tablet Take 1 tablet by mouth daily. Reported on 12/05/2015      Prior to Admission medications   Medication Sig Start Date End Date Taking? Authorizing Provider  aspirin 81 MG tablet Take 81 mg by mouth daily.   Yes  [provider]  Glycopyrrolate-Formoterol (BEVESPI AEROSPHERE) 9-4.8 MCG/ACT AERO Inhale 2 puffs into the lungs 2 (two) times daily. 08/11/16  Yes Tanda Rockers, MD  methimazole (TAPAZOLE) 5 MG tablet Take 5 mg by mouth daily.  07/28/16  Yes [provider]  metoprolol succinate (TOPROL-XL) 25 MG 24 hr tablet Take 25 mg by mouth daily.  08/02/13  Yes [provider]  OXYGEN Inhale 2 L into the lungs continuous.   Yes [provider]  polyethylene glycol (MIRALAX / GLYCOLAX) packet Take 17 g by mouth daily.   Yes [provider]  PROAIR HFA 108 (90 Base) MCG/ACT inhaler Inhale 2 puffs into the lungs every 4 (four) hours as needed for wheezing or shortness of breath.  02/11/16  Yes [provider]  Fluocinonide 0.1 % CREA Use as directed 06/29/16   [provider]  sennosides-docusate sodium (SENOKOT-S) 8.6-50 MG tablet Take 1 tablet by mouth daily. Reported on 12/05/2015    [provider]    Blood pressure 134/77, pulse (!) 106, temperature 99.1 F (37.3 C), temperature source Oral, resp. rate 20, height '5\' 8"'$  (1.727 m), weight 158 lb 4.6 oz (71.8 kg), SpO2 98 %. Physical Exam: General: pleasant, frail appearing AA female who is sitting up in bed and appears uncomfortable with labored breathing HEENT: head is normocephalic, atraumatic.  Sclera are noninjected.  Pupils equal and round.  Ears and nose without any masses or lesions.  Mouth is pink and moist. Dentition fair. NG tube in place. Heart: irregular and tachycardic.  No obvious murmurs, gallops, or rubs noted.  Palpable pedal pulses bilaterally Lungs: CTAB, no wheezes, rhonchi, or rales noted.  Respiratory effort labored. On [place] Abd: soft, distended, minimal global tenderness/no focal tenderness, hypoactive BS, no masses or organomegaly. Umbilical hernia is soft and reducible with no overlying skin changes MS: all 4 extremities are symmetrical with no cyanosis clubbing. UE/LE  edema noted and equal bilateral Skin: warm and dry with no masses, lesions, or rashes Psych: A&Ox3 with an appropriate affect. Neuro: cranial nerves grossly intact, extremity CSM intact bilaterally, normal speech  Results for orders placed or performed during the hospital encounter of 10/17/2016 (from the past 48 hour(s))  Glucose, capillary     Status: Abnormal   Collection Time: 11/08/16  3:28 PM  Result Value Ref Range   Glucose-Capillary 138 (H) 65 - 99 mg/dL   Comment 1 Notify RN   Glucose, capillary     Status: Abnormal   Collection Time: 11/08/16  7:49 PM  Result Value Ref Range   Glucose-Capillary 145 (H) 65 - 99 mg/dL   Comment 1 Notify RN   Glucose, capillary     Status: Abnormal   Collection Time: 11/09/16 12:00 AM  Result Value Ref Range   Glucose-Capillary 207 (H) 65 - 99 mg/dL   Comment 1 Notify RN   Glucose, capillary     Status: Abnormal   Collection Time: 11/09/16  4:39 AM  Result Value Ref Range   Glucose-Capillary 166 (  H) 65 - 99 mg/dL  CBC     Status: Abnormal   Collection Time: 11/09/16  4:47 AM  Result Value Ref Range   WBC 17.0 (H) 4.0 - 10.5 K/uL   RBC 4.66 3.87 - 5.11 MIL/uL   Hemoglobin 12.3 12.0 - 15.0 g/dL   HCT 38.9 36.0 - 46.0 %   MCV 83.5 78.0 - 100.0 fL   MCH 26.4 26.0 - 34.0 pg   MCHC 31.6 30.0 - 36.0 g/dL   RDW 15.8 (H) 11.5 - 15.5 %   Platelets 193 150 - 400 K/uL  Basic metabolic panel     Status: Abnormal   Collection Time: 11/09/16  4:47 AM  Result Value Ref Range   Sodium 138 135 - 145 mmol/L   Potassium 3.6 3.5 - 5.1 mmol/L   Chloride 99 (L) 101 - 111 mmol/L   CO2 31 22 - 32 mmol/L   Glucose, Bld 181 (H) 65 - 99 mg/dL   BUN 40 (H) 6 - 20 mg/dL   Creatinine, Ser 0.93 0.44 - 1.00 mg/dL   Calcium 8.8 (L) 8.9 - 10.3 mg/dL   GFR calc non Af Amer 57 (L) >60 mL/min   GFR calc Af Amer >60 >60 mL/min    Comment: (NOTE) The eGFR has been calculated using the CKD EPI equation. This calculation has not been validated in all clinical  situations. eGFR's persistently <60 mL/min signify possible Chronic Kidney Disease.    Anion gap 8 5 - 15  Magnesium     Status: None   Collection Time: 11/09/16  4:47 AM  Result Value Ref Range   Magnesium 2.0 1.7 - 2.4 mg/dL  Glucose, capillary     Status: Abnormal   Collection Time: 11/09/16  7:27 AM  Result Value Ref Range   Glucose-Capillary 143 (H) 65 - 99 mg/dL   Comment 1 Capillary Specimen    Comment 2 Notify RN   Glucose, capillary     Status: Abnormal   Collection Time: 11/09/16 11:24 AM  Result Value Ref Range   Glucose-Capillary 131 (H) 65 - 99 mg/dL   Comment 1 Capillary Specimen    Comment 2 Notify RN   Glucose, capillary     Status: Abnormal   Collection Time: 11/09/16  5:00 PM  Result Value Ref Range   Glucose-Capillary 155 (H) 65 - 99 mg/dL   Comment 1 Capillary Specimen    Comment 2 Notify RN   Glucose, capillary     Status: Abnormal   Collection Time: 11/09/16  7:27 PM  Result Value Ref Range   Glucose-Capillary 105 (H) 65 - 99 mg/dL   Comment 1 Capillary Specimen   Glucose, capillary     Status: Abnormal   Collection Time: 11/09/16 11:13 PM  Result Value Ref Range   Glucose-Capillary 129 (H) 65 - 99 mg/dL   Comment 1 Capillary Specimen   Glucose, capillary     Status: Abnormal   Collection Time: 11/10/16  3:11 AM  Result Value Ref Range   Glucose-Capillary 157 (H) 65 - 99 mg/dL   Comment 1 Capillary Specimen   Blood gas, arterial     Status: Abnormal   Collection Time: 11/10/16  4:02 AM  Result Value Ref Range   O2 Content 2.0 L/min   Delivery systems NASAL CANNULA    pH, Arterial 7.393 7.350 - 7.450   pCO2 arterial 55.4 (H) 32.0 - 48.0 mmHg   pO2, Arterial 82.7 (L) 83.0 - 108.0 mmHg   Bicarbonate  33.0 (H) 20.0 - 28.0 mmol/L   Acid-Base Excess 8.0 (H) 0.0 - 2.0 mmol/L   O2 Saturation 95.6 %   Patient temperature 98.6    Collection site LEFT RADIAL    Drawn by [id number]    Sample type ARTERIAL DRAW    Allens test (pass/fail) PASS PASS  Basic  metabolic panel     Status: Abnormal   Collection Time: 11/10/16  4:15 AM  Result Value Ref Range   Sodium 139 135 - 145 mmol/L   Potassium 4.6 3.5 - 5.1 mmol/L    Comment: NO VISIBLE HEMOLYSIS   Chloride 95 (L) 101 - 111 mmol/L   CO2 34 (H) 22 - 32 mmol/L   Glucose, Bld 178 (H) 65 - 99 mg/dL   BUN 47 (H) 6 - 20 mg/dL   Creatinine, Ser 1.06 (H) 0.44 - 1.00 mg/dL   Calcium 9.1 8.9 - 10.3 mg/dL   GFR calc non Af Amer 48 (L) >60 mL/min   GFR calc Af Amer 56 (L) >60 mL/min    Comment: (NOTE) The eGFR has been calculated using the CKD EPI equation. This calculation has not been validated in all clinical situations. eGFR's persistently <60 mL/min signify possible Chronic Kidney Disease.    Anion gap 10 5 - 15  CBC     Status: Abnormal   Collection Time: 11/10/16  4:15 AM  Result Value Ref Range   WBC 21.2 (H) 4.0 - 10.5 K/uL   RBC 4.81 3.87 - 5.11 MIL/uL   Hemoglobin 12.6 12.0 - 15.0 g/dL   HCT 40.7 36.0 - 46.0 %   MCV 84.6 78.0 - 100.0 fL   MCH 26.2 26.0 - 34.0 pg   MCHC 31.0 30.0 - 36.0 g/dL   RDW 15.7 (H) 11.5 - 15.5 %   Platelets 215 150 - 400 K/uL  Magnesium     Status: None   Collection Time: 11/10/16  4:15 AM  Result Value Ref Range   Magnesium 2.1 1.7 - 2.4 mg/dL  Phosphorus     Status: Abnormal   Collection Time: 11/10/16  4:15 AM  Result Value Ref Range   Phosphorus 4.7 (H) 2.5 - 4.6 mg/dL  Lactate dehydrogenase     Status: Abnormal   Collection Time: 11/10/16  4:15 AM  Result Value Ref Range   LDH 320 (H) 98 - 192 U/L  Lactic acid, plasma     Status: None   Collection Time: 11/10/16  4:30 AM  Result Value Ref Range   Lactic Acid, Venous 1.0 0.5 - 1.9 mmol/L  Glucose, capillary     Status: Abnormal   Collection Time: 11/10/16  7:38 AM  Result Value Ref Range   Glucose-Capillary 124 (H) 65 - 99 mg/dL   Comment 1 Notify RN   Glucose, capillary     Status: None   Collection Time: 11/10/16 11:35 AM  Result Value Ref Range   Glucose-Capillary 70 65 - 99 mg/dL    Comment 1 Notify RN    Ct Abdomen Pelvis Wo Contrast  Result Date: 11/10/2016 CLINICAL DATA:  Abdominal distention. EXAM: CT ABDOMEN AND PELVIS WITHOUT CONTRAST TECHNIQUE: Multidetector CT imaging of the abdomen and pelvis was performed following the standard protocol without IV contrast. COMPARISON:  10/11/2014 FINDINGS: Lower chest: Small bilateral pleural effusions are identified. There is extensive chest wall emphysema noted. Hepatobiliary: No focal liver abnormality. Stones identified within the gallbladder. No biliary dilatation. Pancreas: Calcification within the tail of pancreas noted. Spleen: Normal in size without  focal abnormality. Adrenals/Urinary Tract: The adrenal glands appear normal. The kidneys are unremarkable. No mass or hydronephrosis. Urinary bladder is collapsed around a Foley catheter. Stomach/Bowel: The stomach appears scratch set nasogastric tube tip is in the body of the stomach. Small bowel loops measure up to 3.2 cm. Multiple small bowel air-fluid levels identified. Dilated loop of small bowel enters the ventral abdominal wall hernia. Distal small bowel loops have a normal caliber. The appendix is visualized and appears normal. Normal caliber of the colon. Vascular/Lymphatic: Aortic atherosclerosis. Infrarenal abdominal aortic aneurysm measures 4.2 cm, image 37 of series 3. No upper abdominal or pelvic adenopathy. Reproductive: Status post hysterectomy. No adnexal masses. Other: There is a small volume of ascites identified within the pelvis. There is extensive ventral abdominal wall and lateral abdominal wall subcutaneous emphysema. Several injection granulomas identified. 1. Musculoskeletal: No aggressive lytic or sclerotic bone lesions. IMPRESSION: 1. Abnormal increase caliber of small bowel loops measuring up to 3.2 cm. There is a ventral abdominal wall hernia which contains dilated loops of small bowel. Cannot rule out partial obstruction at this level. No evidence for  high-grade small bowel obstruction as there is gas identified distal to the hernia as well as throughout the colon. 2. Aortic atherosclerosis and infrarenal abdominal aortic aneurysm measuring 4.2 cm. 3. Small volume of pelvic ascites 4. Bilateral pleural effusions 5. Extensive chest wall and abdominal wall subcutaneous gas. These results will be called to the ordering clinician or representative by the Radiologist Assistant, and communication documented in the PACS or zVision Dashboard. Electronically Signed   By: Kerby Moors M.D.   On: 11/10/2016 12:33   Dg Chest Port 1 View  Result Date: 11/10/2016 CLINICAL DATA:  Chest tube. EXAM: PORTABLE CHEST 1 VIEW COMPARISON:  11/09/2016 . FINDINGS: Interim extubation. Right PICC line, NG tube, chest tube in stable position. No pneumothorax. Heart size normal. Progressive right base infiltrate. Right perihilar/upper lobe atelectasis and/or scar. Mild left base atelectasis. No pneumothorax. Diffuse chest wall subcutaneous emphysema . IMPRESSION: 1. Interim extubation. Remaining lines and tubes including right chest tube in stable position. No pneumothorax. Extensive chest wall subcutaneous emphysema again noted. 2.  Progressive right base infiltrate. 3. Persistent right perihilar/ upper lobe atelectasis and/or scarring. Electronically Signed   By: Marcello Moores  Register   On: 11/10/2016 07:39   Dg Chest Port 1 View  Result Date: 11/09/2016 CLINICAL DATA:  Respiratory failure. EXAM: PORTABLE CHEST 1 VIEW COMPARISON:  11/08/2016.  11/08/2016.  CT 11/06/2016. FINDINGS: Endotracheal tube tip noted at the orifice of the right mainstem bronchus. Proximal repositioning of approximately 2 cm suggested. Interim placement of PICC line, its tip is projected over the right atrium. NG tube and right chest tube in stable position. No pneumothorax. Right hilar density right upper lobe atelectatic changes and/or scarring again noted. Improved aeration of the lung bases. No significant  tiny left pleural effusion cannot be excluded. Extensive chest wall subcutaneous emphysema again noted. IMPRESSION: 1. Endotracheal tube tip noted at the or persistent right mainstem bronchus. Proximal repositioning of approximately 2 cm suggested. 2. Interim placement of PICC line, its tip is noted over the right atrium. NG tube and right chest tube in stable position. No pneumothorax. Extensive chest wall subcutaneous emphysema again noted. 3. Persistent right perihilar density. Persistent right upper lobe atelectasis and or scarring. Improved aeration both lung bases. Tiny left pleural effusion cannot be excluded . Critical Value/emergent results were called by telephone at the time of interpretation on 11/09/2016 at 7:02 am to nurse  Mae, who verbally acknowledged these results. Electronically Signed   By: Marcello Moores  Register   On: 11/09/2016 07:03   Dg Abd Portable 1v  Result Date: 11/10/2016 CLINICAL DATA:  Acute ileus, abdominal pain EXAM: PORTABLE ABDOMEN - 1 VIEW COMPARISON:  11/06/2016 KUB, CT 10/22/2016 FINDINGS: Subcutaneous emphysema along the visualized right hemiabdomen secondary to reported right pneumothorax. Scattered moderate gaseous distention of small and large bowel loops in a pattern consistent with ileus. There is however a segment of bowel that projects over the expected location of the right inguinal region and the possibility of the inguinal hernia is not entirely excluded. There is presence of a right-sided inguinal hernia on recent CT. IMPRESSION: 1. Dilated small bowel loops suspicious for small bowel ileus or evolving small bowel obstruction. Small segment of bowel is seen projecting over the right inguinal region. Herniation of small bowel is not entirely excluded. 2. Subcutaneous emphysema tracking from known right pneumothorax. Electronically Signed   By: Ashley Royalty M.D.   On: 11/10/2016 03:10   Anti-infectives    Start     Dose/Rate Route Frequency Ordered Stop   11/06/16 0800   cefTAZidime (FORTAZ) 2 g in dextrose 5 % 50 mL IVPB     2 g 100 mL/hr over 30 Minutes Intravenous Every 12 hours 11/06/16 0700     10/22/2016 2200  cefTRIAXone (ROCEPHIN) 2 g in dextrose 5 % 50 mL IVPB  Status:  Discontinued     2 g 100 mL/hr over 30 Minutes Intravenous Every 24 hours 10/09/2016 2131 11/06/16 0654        Assessment/Plan Acute on chronic hypoxic/hypercapnic respiratory failure 2nd to AECOPD and HCAP Spontaneous right PTX A fib with RVR COPD on chronic 2L O2 (followed by Dr. Melvyn Novas) RUL lung CA s/p chemoradiation with chemotherapy in 2016 (followed by Dr. Earlie Server) HTN Hyperthyroidism  Umbilical hernia with possible partial SBO - abdominal surgical history: hysterectomy - abdominal pain/distension associated with n/v began yesterday - CT scan showed a ventral abdominal wall hernia containing dilated loops of small bowel with a possible partial SBO - NG tube in place with 800cc output  ID - rocephin 5/29>>6/1; fortaz 6/1>> VTE - SCDs, heparin FEN - IVF, NPO/NGT  Plan - Umbilical hernia is soft and reducible, would not recommend any surgical intervention. Continue NPO/NGT. Will continue to follow. CCM to see patient for labored breathing.  Jerrye Beavers, [facility] Surgery 11/10/2016, 3:14 PM Pager: [phone] Consults: [phone] Mon-Fri 7:00 am-4:30 pm Sat-Sun 7:00 am-11:30 am

## 2016-11-11 ENCOUNTER — Inpatient Hospital Stay (HOSPITAL_COMMUNITY): Payer: Medicare Other

## 2016-11-11 LAB — BASIC METABOLIC PANEL
ANION GAP: 9 (ref 5–15)
BUN: 46 mg/dL — ABNORMAL HIGH (ref 6–20)
CO2: 34 mmol/L — ABNORMAL HIGH (ref 22–32)
Calcium: 8.5 mg/dL — ABNORMAL LOW (ref 8.9–10.3)
Chloride: 98 mmol/L — ABNORMAL LOW (ref 101–111)
Creatinine, Ser: 0.98 mg/dL (ref 0.44–1.00)
GFR, EST NON AFRICAN AMERICAN: 53 mL/min — AB (ref >60)
Glucose, Bld: 111 mg/dL — ABNORMAL HIGH (ref 65–99)
POTASSIUM: 4.7 mmol/L (ref 3.5–5.1)
SODIUM: 141 mmol/L (ref 135–145)

## 2016-11-11 LAB — GLUCOSE, CAPILLARY
GLUCOSE-CAPILLARY: 101 mg/dL — AB (ref 65–99)
GLUCOSE-CAPILLARY: 114 mg/dL — AB (ref 65–99)
Glucose-Capillary: 124 mg/dL — ABNORMAL HIGH (ref 65–99)

## 2016-11-11 LAB — CBC
HCT: 39.2 % (ref 36.0–46.0)
HEMOGLOBIN: 11.8 g/dL — AB (ref 12.0–15.0)
MCH: 26.1 pg (ref 26.0–34.0)
MCHC: 30.1 g/dL (ref 30.0–36.0)
MCV: 86.7 fL (ref 78.0–100.0)
Platelets: 225 10*3/uL (ref 150–400)
RBC: 4.52 MIL/uL (ref 3.87–5.11)
RDW: 16.2 % — ABNORMAL HIGH (ref 11.5–15.5)
WBC: 23 10*3/uL — AB (ref 4.0–10.5)

## 2016-11-11 LAB — MAGNESIUM: MAGNESIUM: 2.3 mg/dL (ref 1.7–2.4)

## 2016-11-11 MED ORDER — ACETAMINOPHEN 325 MG PO TABS: 650.0000 mg | ORAL_TABLET | Freq: Four times a day (QID) | ORAL | Status: DC | PRN

## 2016-11-11 MED ORDER — SODIUM CHLORIDE 0.9 % IV SOLN
0.0000 mg/h | INTRAVENOUS | Status: DC
  Administered 2016-11-11: 2 mg/h via INTRAVENOUS
  Filled 2016-11-11: qty 10

## 2016-11-11 MED ORDER — ONDANSETRON 4 MG PO TBDP: 4.0000 mg | ORAL_TABLET | Freq: Four times a day (QID) | ORAL | Status: DC | PRN

## 2016-11-11 MED ORDER — LORAZEPAM 1 MG PO TABS: 1.0000 mg | ORAL_TABLET | ORAL | Status: DC | PRN

## 2016-11-11 MED ORDER — MORPHINE BOLUS VIA INFUSION
1.0000 mg | INTRAVENOUS | Status: DC | PRN
  Filled 2016-11-11: qty 2

## 2016-11-11 MED ORDER — GLYCOPYRROLATE 0.2 MG/ML IJ SOLN: 0.2000 mg | INTRAMUSCULAR | Status: DC | PRN

## 2016-11-11 MED ORDER — GLYCOPYRROLATE 1 MG PO TABS
1.0000 mg | ORAL_TABLET | ORAL | Status: DC | PRN
  Filled 2016-11-11: qty 1

## 2016-11-11 MED ORDER — LORAZEPAM 2 MG/ML IJ SOLN
1.0000 mg | INTRAMUSCULAR | Status: DC | PRN
  Administered 2016-11-11: 1 mg via INTRAVENOUS
  Filled 2016-11-11: qty 1

## 2016-11-11 MED ORDER — MORPHINE SULFATE (PF) 4 MG/ML IV SOLN
INTRAVENOUS | Status: AC
  Filled 2016-11-11: qty 1

## 2016-11-11 MED ORDER — ONDANSETRON HCL 4 MG/2ML IJ SOLN: 4.0000 mg | Freq: Four times a day (QID) | INTRAMUSCULAR | Status: DC | PRN

## 2016-11-11 MED ORDER — ACETAMINOPHEN 650 MG RE SUPP: 650.0000 mg | Freq: Four times a day (QID) | RECTAL | Status: DC | PRN

## 2016-11-11 MED ORDER — MORPHINE SULFATE (PF) 4 MG/ML IV SOLN
1.0000 mg | INTRAVENOUS | Status: DC | PRN
  Administered 2016-11-11 (×3): 1 mg via INTRAVENOUS
  Filled 2016-11-11: qty 1

## 2016-11-11 MED ORDER — LORAZEPAM 2 MG/ML PO CONC: 1.0000 mg | ORAL | Status: DC | PRN

## 2016-11-11 MED ORDER — BIOTENE DRY MOUTH MT LIQD: 15.0000 mL | OROMUCOSAL | Status: DC | PRN

## 2016-11-11 MED ORDER — POLYVINYL ALCOHOL 1.4 % OP SOLN: 1.0000 [drp] | Freq: Four times a day (QID) | OPHTHALMIC | Status: DC | PRN

## 2016-11-13 ENCOUNTER — Telehealth: Payer: Self-pay | Admitting: Pulmonary Disease

## 2016-11-13 NOTE — Telephone Encounter (Signed)
11/13/16 received death certificate on patient, from [place]  home for Dr. Ahmed Prima by courier [place] [address] for him to sign..  11/13/16 I received d/c back from Dr. Halford Chessman and called [place] [facility] to Lena up. PWR.

## 2016-11-17 ENCOUNTER — Encounter: Payer: Medicare Other | Admitting: Adult Health

## 2016-11-24 ENCOUNTER — Other Ambulatory Visit: Payer: Medicare Other

## 2016-12-01 ENCOUNTER — Ambulatory Visit: Payer: Medicare Other | Admitting: Internal Medicine

## 2016-12-06 NOTE — Care Management Note (Signed)
Case Management Note Marvetta Gibbons RN, BSN Unit 2W-Case Manager-- [place] coverage [phone]  Patient Details  Name: Susan Garrett MRN: [id number] Date of Birth: 1935-05-28  Subjective/Objective:   Pt admitted with resp. Distress/failure- intubated- extubated 11/09/16                 Action/Plan: PTA pt lived at home- hx of COPD- on home 02- CM will follow for d/c needs  Expected Discharge Date:                  Expected Discharge Plan:  Expired  In-House Referral:     Discharge planning Services  CM Consult  Post Acute Care Choice:    Choice offered to:     DME Arranged:    DME Agency:     HH Arranged:    [place] Agency:     Status of Service:  Completed, signed off  If discussed at [employer] of Stay Meetings, dates discussed:    Discharge Disposition: expired   Additional Comments:  12-10-2016- Marvetta Gibbons RN, CM- pt has been made comfort Care-   Dahlia Client Romeo Rabon, RN 11/12/2016, 8:42 AM

## 2016-12-06 NOTE — Progress Notes (Signed)
PCCM Interval Progress Note  Family at bedside including pt's sister (May) who is HCPOA.  The family has decided to offer full comfort care for Mrs. Jamerson. They are aware that the patient may be transferred to the palliative care floor for continued comfort care needs. They have been fully updated on the process and expectations and all questions have been answered.  Emotional support offered.   Montey Hora, [facility] Pager: [phone]  or [phone] 11/14/16, 3:42 PM

## 2016-12-06 NOTE — Progress Notes (Signed)
Progress Note  Patient Name: Susan Garrett Date of Encounter: 12/01/2016  Primary Cardiologist: new ([place])  Subjective   Unable to respond.  Somnolent on BiPap  Inpatient Medications    Scheduled Meds: . arformoterol  15 mcg Nebulization BID  . budesonide (PULMICORT) nebulizer solution  0.5 mg Nebulization BID  . chlorhexidine  15 mL Mouth Rinse BID  . Chlorhexidine Gluconate Cloth  6 each Topical Daily  . heparin  5,000 Units Subcutaneous Q8H  . hydrALAZINE  5 mg Intravenous Q8H  . insulin aspart  0-15 Units Subcutaneous Q4H  . mouth rinse  15 mL Mouth Rinse q12n4p  . methylPREDNISolone (SOLU-MEDROL) injection  20 mg Intravenous Q12H  . metoprolol tartrate  2.5 mg Intravenous Q6H  . pantoprazole (PROTONIX) IV  40 mg Intravenous Q24H   Continuous Infusions: . sodium chloride 50 mL/hr at 12/01/2016 0700  . amiodarone 30 mg/hr (12-01-2016 0700)  . cefTAZidime (FORTAZ)  IV Stopped (11/10/16 2045)  . dexmedetomidine (PRECEDEX) IV infusion 0.5 mcg/kg/hr (12-01-16 0700)   PRN Meds: fentaNYL (SUBLIMAZE) injection, ipratropium-albuterol, labetalol, morphine injection, ondansetron (ZOFRAN) IV, sodium chloride flush   Vital Signs    Vitals:   12-01-2016 0727 01-Dec-2016 0738 2016/12/01 0742 12/01/2016 0754  BP:    (!) 156/80  Pulse:  (!) 103    Resp:  (!) 25    Temp: 97.8 F (36.6 C)     TempSrc: Oral     SpO2:  (!) 89% 92%   Weight:      Height:        Intake/Output Summary (Last 24 hours) at 12-01-2016 0800 Last data filed at 12/01/16 0700  Gross per 24 hour  Intake          2555.54 ml  Output              885 ml  Net          1670.54 ml   Filed Weights   11/09/16 0449 11/10/16 0500 2016/12/01 0500  Weight: 71.3 kg (157 lb 3 oz) 71.8 kg (158 lb 4.6 oz) 72.8 kg (160 lb 7.9 oz)    Telemetry    Episodes of atrial flutter.  Sinus rhythm and sinus tachycardia.  Currently sinus rhythm.  - Personally Reviewed  ECG    11/08/16:  atrial flutter with variable ventricular  response. Ventricular rate 154 bpm. - Personally Reviewed  Physical Exam   GEN: Somnolent on bipap.  Agonal breathing. Neck: No JVD Cardiac:   Regular rate and rhythm.   No murmurs, rubs, or gallops.  Respiratory: R chest tube.  No crackles, wheezes or rhonchi GI:  Distended. Umbilical hernia.  Hypoactive BS. MS: No edema; No deformity. Neuro:  Nonfocal  Psych: unable to assess  Labs    Chemistry  Recent Labs Lab 11/09/16 0447 11/10/16 0415 2016/12/01 0346  NA 138 139 141  K 3.6 4.6 4.7  CL 99* 95* 98*  CO2 31 34* 34*  GLUCOSE 181* 178* 111*  BUN 40* 47* 46*  CREATININE 0.93 1.06* 0.98  CALCIUM 8.8* 9.1 8.5*  GFRNONAA 57* 48* 53*  GFRAA >60 56* >60  ANIONGAP 8 10 9      Hematology  Recent Labs Lab 11/09/16 0447 11/10/16 0415 12-01-16 0346  WBC 17.0* 21.2* 23.0*  RBC 4.66 4.81 4.52  HGB 12.3 12.6 11.8*  HCT 38.9 40.7 39.2  MCV 83.5 84.6 86.7  MCH 26.4 26.2 26.1  MCHC 31.6 31.0 30.1  RDW 15.8* 15.7* 16.2*  PLT 193 215  225    Cardiac EnzymesNo results for input(s): TROPONINI in the last 168 hours.  No results for input(s): TROPIPOC in the last 168 hours.   BNP No results for input(s): BNP, PROBNP in the last 168 hours.   DDimer No results for input(s): DDIMER in the last 168 hours.   Radiology    Ct Abdomen Pelvis Wo Contrast  Result Date: 11/10/2016 CLINICAL DATA:  Abdominal distention. EXAM: CT ABDOMEN AND PELVIS WITHOUT CONTRAST TECHNIQUE: Multidetector CT imaging of the abdomen and pelvis was performed following the standard protocol without IV contrast. COMPARISON:  10/11/2014 FINDINGS: Lower chest: Small bilateral pleural effusions are identified. There is extensive chest wall emphysema noted. Hepatobiliary: No focal liver abnormality. Stones identified within the gallbladder. No biliary dilatation. Pancreas: Calcification within the tail of pancreas noted. Spleen: Normal in size without focal abnormality. Adrenals/Urinary Tract: The adrenal glands  appear normal. The kidneys are unremarkable. No mass or hydronephrosis. Urinary bladder is collapsed around a Foley catheter. Stomach/Bowel: The stomach appears scratch set nasogastric tube tip is in the body of the stomach. Small bowel loops measure up to 3.2 cm. Multiple small bowel air-fluid levels identified. Dilated loop of small bowel enters the ventral abdominal wall hernia. Distal small bowel loops have a normal caliber. The appendix is visualized and appears normal. Normal caliber of the colon. Vascular/Lymphatic: Aortic atherosclerosis. Infrarenal abdominal aortic aneurysm measures 4.2 cm, image 37 of series 3. No upper abdominal or pelvic adenopathy. Reproductive: Status post hysterectomy. No adnexal masses. Other: There is a small volume of ascites identified within the pelvis. There is extensive ventral abdominal wall and lateral abdominal wall subcutaneous emphysema. Several injection granulomas identified. 1. Musculoskeletal: No aggressive lytic or sclerotic bone lesions. IMPRESSION: 1. Abnormal increase caliber of small bowel loops measuring up to 3.2 cm. There is a ventral abdominal wall hernia which contains dilated loops of small bowel. Cannot rule out partial obstruction at this level. No evidence for high-grade small bowel obstruction as there is gas identified distal to the hernia as well as throughout the colon. 2. Aortic atherosclerosis and infrarenal abdominal aortic aneurysm measuring 4.2 cm. 3. Small volume of pelvic ascites 4. Bilateral pleural effusions 5. Extensive chest wall and abdominal wall subcutaneous gas. These results will be called to the ordering clinician or representative by the Radiologist Assistant, and communication documented in the PACS or zVision Dashboard. Electronically Signed   By: Kerby Moors M.D.   On: 11/10/2016 12:33   Dg Chest Port 1 View  Result Date: 11-20-2016 CLINICAL DATA:  Shortness of breath, acute respiratory failure and spontaneous right-sided  tension pneumothorax. Chest tube treatment. Underlying COPD, right upper lung malignancy, former smoker. EXAM: PORTABLE CHEST 1 VIEW COMPARISON:  Portable chest x-ray of November 10, 2016 FINDINGS: A large amount of subcutaneous emphysema persists. No discrete pleural line on the right is observed. The pigtail pleural space catheter on the right is in stable position. There is no mediastinal shift. There is persistent left basilar density with small left pleural effusion. The heart and pulmonary vascularity are normal. There remains soft tissue prominence in the right suprahilar region. There is calcification in the wall of the aortic arch. The esophagogastric tube tip projects below the inferior margin of the image with the proximal port just below the GE junction. The PICC line tip projects over the distal third of the SVC. IMPRESSION: No definite residual pneumothorax the lower large amount of subcutaneous gas is noted overlying the thorax. The chest tube is in stable  position. Bibasilar atelectasis or pneumonia with small bilateral pleural effusions. Persistent right suprahilar density which may reflect the patient's known malignancy or treatment thereof. No CHF.  Thoracic aortic atherosclerosis. Electronically Signed   By: David  Martinique M.D.   On: 11-27-16 07:54   Dg Chest Port 1 View  Result Date: 11/10/2016 CLINICAL DATA:  82 year old with a pigtail chest tube in place in the right pleural space for a spontaneous right pneumothorax on 10/15/2016, currently with subcutaneous emphysema, now with acutely labored breathing and chest congestion. EXAM: PORTABLE CHEST 1 VIEW 4:05 p.m.: COMPARISON:  Portable chest x-ray earlier today 5:45 a.m., 11/09/2016 and earlier. CT chest 11/06/2016, 10/28/2016, 05/19/2016. FINDINGS: Right pleural pigtail drainage catheter unchanged in position. No visible pneumothorax. Extensive subcutaneous emphysema involving the chest wall, abdominal wall, neck and right upper arm,  unchanged. Scar/bronchiectasis involving the right upper lobe, unchanged dating back to December, 2017. Apparent patchy airspace opacities at the right lung base are artifactual and related to the subcutaneous emphysema in the anterior and posterior right chest wall. No new pulmonary parenchymal abnormalities. Nasogastric tube tip in the fundus of the stomach. Right arm PICC tip projects over the lower SVC, unchanged. IMPRESSION: 1.  Support apparatus satisfactory. 2. No pneumothorax with right pleural pigtail drainage catheter in place. 3. No acute cardiopulmonary disease is suspected currently. Apparent airspace opacities at the right lung base are artifactual and felt to be related to the subcutaneous emphysema in the anterior and posterior chest wall. Electronically Signed   By: Evangeline Dakin M.D.   On: 11/10/2016 16:16   Dg Chest Port 1 View  Result Date: 11/10/2016 CLINICAL DATA:  Chest tube. EXAM: PORTABLE CHEST 1 VIEW COMPARISON:  11/09/2016 . FINDINGS: Interim extubation. Right PICC line, NG tube, chest tube in stable position. No pneumothorax. Heart size normal. Progressive right base infiltrate. Right perihilar/upper lobe atelectasis and/or scar. Mild left base atelectasis. No pneumothorax. Diffuse chest wall subcutaneous emphysema . IMPRESSION: 1. Interim extubation. Remaining lines and tubes including right chest tube in stable position. No pneumothorax. Extensive chest wall subcutaneous emphysema again noted. 2.  Progressive right base infiltrate. 3. Persistent right perihilar/ upper lobe atelectasis and/or scarring. Electronically Signed   By: Marcello Moores  Register   On: 11/10/2016 07:39   Dg Abd Portable 1v  Result Date: 11/10/2016 CLINICAL DATA:  Acute ileus, abdominal pain EXAM: PORTABLE ABDOMEN - 1 VIEW COMPARISON:  11/06/2016 KUB, CT 10/20/2016 FINDINGS: Subcutaneous emphysema along the visualized right hemiabdomen secondary to reported right pneumothorax. Scattered moderate gaseous  distention of small and large bowel loops in a pattern consistent with ileus. There is however a segment of bowel that projects over the expected location of the right inguinal region and the possibility of the inguinal hernia is not entirely excluded. There is presence of a right-sided inguinal hernia on recent CT. IMPRESSION: 1. Dilated small bowel loops suspicious for small bowel ileus or evolving small bowel obstruction. Small segment of bowel is seen projecting over the right inguinal region. Herniation of small bowel is not entirely excluded. 2. Subcutaneous emphysema tracking from known right pneumothorax. Electronically Signed   By: Ashley Royalty M.D.   On: 11/10/2016 03:10    Cardiac Studies   Echo 11/08/16: Study Conclusions  - Left ventricle: The cavity size was normal. Wall thickness was   normal. Systolic function was normal. The estimated ejection   fraction was in the range of 60% to 65%. Doppler parameters are   consistent with abnormal left ventricular relaxation (grade 1  diastolic dysfunction). - Mitral valve: Calcified annulus.  Impressions:  - Technically poor study, even with Definity contrast.   Patient Profile     82 y.o. female with hypertension, COPD on home O2, R lung cancer s/p XRT and chemo in 2016, and hypothyroidism admitted 5/29 with acute on chronic respiratory failure and R pneumothorax.  Cardiology was consulted to atrial arrhythymias and found to have PAT, MAP, atrial flutter and possible atrial fibrillation.  Assessment & Plan    # PAT/MAT:  # Atrial flutter:  Ms. Wassel had recurrent atrial flutter yesterday and this am.  Amiodarone was restarted.  RN notes that this often happens when she is stimulated or turned.  She is currently in sinus rhythm.  Continue amiodarone.  Continue metoprolol 2.5mg  q6h.  Can increase or add diltiazem drip if needed for heart rate control in atrial flutter.  # Hypertension: BP better controlled with hydralazine,  though labile.  Continue hydralazine for SBP >130 and metoprolol as above.    # Acute on chronic respiratory failure: # COPD: Ms. Delangel appears to be actively dying.  Family has been contacted by critical care.  Comfort care.   Time spent: 45 minutes-Greater than 50% of this time was spent in counseling, explanation of diagnosis, planning of further management, and coordination of care.   Signed, Skeet Latch, MD  December 01, 2016, 8:00 AM

## 2016-12-06 NOTE — Progress Notes (Addendum)
PULMONARY / CRITICAL CARE MEDICINE  Susan Garrett is a 82 y.o. female brought to ER on 10/31/2016 with dyspnea and hypoxia with altered mental status, and required intubation.  She was noted to also have Rt PTX.  She has PMHx of COPD on chronic 2L O2 (followed by Dr. Melvyn Novas) and  RUL lung CA s/p chemoradiation with chemotherapy in 2016 (followed by Dr. Earlie Server).  She intubated and extubated several times.  She developed an ileus.  She developed recurrent respiratory distress.  Family and pt decided to try Bipap, but not pursue repeat intubation.  She was made DNR.    SUBJECTIVE:   Difficulty keeping up oxygen level.  VITAL SIGNS: BP (!) 150/81   Pulse (!) 103   Temp 97.8 F (36.6 C) (Oral)   Resp (!) 25   Ht 5\' 8"  (1.727 m)   Wt 160 lb 7.9 oz (72.8 kg)   SpO2 92%   BMI 24.40 kg/m   INTAKE / OUTPUT: I/O last 3 completed shifts: In: 3056 [I.V.:2866; Other:40; IV Piggyback:150] Out: 0272 [Urine:625; Emesis/NG output:1000; Chest Tube:60]   PHYSICAL EXAMINATION:  General - somnolent ENT - Bipap mask on Cardiac - irregular, tachycardic, no murmur Chest - crepitus, decreased BS, Rt chest tube, using accessory muscles Abd - distended, ventral hernia, decreased bowel sounds Ext - 1+ edema, extremities cool Skin - no rashes Neuro - follows simple commands  LABS:  BMET  Recent Labs Lab 11/09/16 0447 11/10/16 0415 2016/12/11 0346  NA 138 139 141  K 3.6 4.6 4.7  CL 99* 95* 98*  CO2 31 34* 34*  BUN 40* 47* 46*  CREATININE 0.93 1.06* 0.98  GLUCOSE 181* 178* 111*    Electrolytes  Recent Labs Lab 11/07/16 0456 11/07/16 1622  11/09/16 0447 11/10/16 0415 12/11/2016 0346  CALCIUM 8.6*  --   < > 8.8* 9.1 8.5*  MG 2.1 2.0  --  2.0 2.1 2.3  PHOS 1.6* 1.9*  --   --  4.7*  --   < > = values in this interval not displayed.  CBC  Recent Labs Lab 11/09/16 0447 11/10/16 0415 2016-12-11 0346  WBC 17.0* 21.2* 23.0*  HGB 12.3 12.6 11.8*  HCT 38.9 40.7 39.2  PLT 193 215 225     Coag's No results for input(s): APTT, INR in the last 168 hours.  Sepsis Markers  Recent Labs Lab 11/05/16 0344  11/06/16 0230 11/06/16 0907 11/10/16 0430  LATICACIDVEN  --   < > 2.5* 2.2* 1.0  PROCALCITON 0.46  --  0.69  --   --   < > = values in this interval not displayed.  ABG  Recent Labs Lab 11/05/16 1833 11/10/16 0402 11/10/16 1555  PHART 7.442 7.393 7.372  PCO2ART 47.4 55.4* 62.0*  PO2ART 491.0* 82.7* 86.0    Liver Enzymes No results for input(s): AST, ALT, ALKPHOS, BILITOT, ALBUMIN in the last 168 hours.  Cardiac Enzymes No results for input(s): TROPONINI, PROBNP in the last 168 hours.  Glucose  Recent Labs Lab 11/10/16 1135 11/10/16 1619 11/10/16 1951 11/10/16 2310 12-11-2016 0335 12/11/16 0725  GLUCAP 70 127* 104* 93 101* 114*    Imaging Ct Abdomen Pelvis Wo Contrast  Result Date: 11/10/2016 CLINICAL DATA:  Abdominal distention. EXAM: CT ABDOMEN AND PELVIS WITHOUT CONTRAST TECHNIQUE: Multidetector CT imaging of the abdomen and pelvis was performed following the standard protocol without IV contrast. COMPARISON:  10/11/2014 FINDINGS: Lower chest: Small bilateral pleural effusions are identified. There is extensive chest wall emphysema noted.  Hepatobiliary: No focal liver abnormality. Stones identified within the gallbladder. No biliary dilatation. Pancreas: Calcification within the tail of pancreas noted. Spleen: Normal in size without focal abnormality. Adrenals/Urinary Tract: The adrenal glands appear normal. The kidneys are unremarkable. No mass or hydronephrosis. Urinary bladder is collapsed around a Foley catheter. Stomach/Bowel: The stomach appears scratch set nasogastric tube tip is in the body of the stomach. Small bowel loops measure up to 3.2 cm. Multiple small bowel air-fluid levels identified. Dilated loop of small bowel enters the ventral abdominal wall hernia. Distal small bowel loops have a normal caliber. The appendix is visualized and  appears normal. Normal caliber of the colon. Vascular/Lymphatic: Aortic atherosclerosis. Infrarenal abdominal aortic aneurysm measures 4.2 cm, image 37 of series 3. No upper abdominal or pelvic adenopathy. Reproductive: Status post hysterectomy. No adnexal masses. Other: There is a small volume of ascites identified within the pelvis. There is extensive ventral abdominal wall and lateral abdominal wall subcutaneous emphysema. Several injection granulomas identified. 1. Musculoskeletal: No aggressive lytic or sclerotic bone lesions. IMPRESSION: 1. Abnormal increase caliber of small bowel loops measuring up to 3.2 cm. There is a ventral abdominal wall hernia which contains dilated loops of small bowel. Cannot rule out partial obstruction at this level. No evidence for high-grade small bowel obstruction as there is gas identified distal to the hernia as well as throughout the colon. 2. Aortic atherosclerosis and infrarenal abdominal aortic aneurysm measuring 4.2 cm. 3. Small volume of pelvic ascites 4. Bilateral pleural effusions 5. Extensive chest wall and abdominal wall subcutaneous gas. These results will be called to the ordering clinician or representative by the Radiologist Assistant, and communication documented in the PACS or zVision Dashboard. Electronically Signed   By: Kerby Moors M.D.   On: 11/10/2016 12:33   Dg Chest Port 1 View  Result Date: 11/10/2016 CLINICAL DATA:  81 year old with a pigtail chest tube in place in the right pleural space for a spontaneous right pneumothorax on 11/02/2016, currently with subcutaneous emphysema, now with acutely labored breathing and chest congestion. EXAM: PORTABLE CHEST 1 VIEW 4:05 p.m.: COMPARISON:  Portable chest x-ray earlier today 5:45 a.m., 11/09/2016 and earlier. CT chest 11/06/2016, 10/11/2016, 05/19/2016. FINDINGS: Right pleural pigtail drainage catheter unchanged in position. No visible pneumothorax. Extensive subcutaneous emphysema involving the chest  wall, abdominal wall, neck and right upper arm, unchanged. Scar/bronchiectasis involving the right upper lobe, unchanged dating back to December, 2017. Apparent patchy airspace opacities at the right lung base are artifactual and related to the subcutaneous emphysema in the anterior and posterior right chest wall. No new pulmonary parenchymal abnormalities. Nasogastric tube tip in the fundus of the stomach. Right arm PICC tip projects over the lower SVC, unchanged. IMPRESSION: 1.  Support apparatus satisfactory. 2. No pneumothorax with right pleural pigtail drainage catheter in place. 3. No acute cardiopulmonary disease is suspected currently. Apparent airspace opacities at the right lung base are artifactual and felt to be related to the subcutaneous emphysema in the anterior and posterior chest wall. Electronically Signed   By: Evangeline Dakin M.D.   On: 11/10/2016 16:16     STUDIES:  CT head 5/29 > No acute. Stable chronic microvascular ischemic changes and mild parenchymal volume loss  CT chest 5/29 > severe emphysema, Chest tube in place CT Chest 6/1 > R chest tube with trace basilar PTX, extensive sub-q empysema, peribronchial soft tissue thickening with suspected post-obstructive opacity in the RUL, favor radiation changes, 6 mm LLL nodule ECHO 6/3 > EF 60-65, G1DD  CULTURES: Blood 5/29 > Negative  Sputum 5/29 > neg MRSA PCR 5/29 > Negative   ANTIBIOTICS: Ceftriaxone 5/29 > 6/1 Ceftazidime 6/1 >>    LINES/TUBES: ETT 5/29 > 5/30 ETT 5/31 >> 6/04 R Pigtail CT 5/29 >>   DISCUSSION: 83 yo female with hx O2 dependent COPD and remote RUL lung CA, admitted 5/29 with acute hypercarbic respiratory failure and AECOPD requiring intubation.  ASSESSMENT:  Acute on chronic hypoxic/hypercapnic respiratory failure COPD exacerbation HCAP Spontaneous Right pneumothorax Ileus Hx of AAA A fib with RVR Hx of HTN Hx of hyperthyroidism Steroid induced hyperglycemia. Acute metabolic  encephalopathy Deconditioning Hx of lung cancer Leukocytosis  PLAN: DNR Continue Bipap and supplemental oxygen Prn morphine for dyspnea  Updated pt's sister and niece over the phone.  Explained that Ms. Belfield doesn't appear to have much longer, and that they should gather family.    CC time spent 31 minutes  Chesley Mires, MD [place] Nov 29, 2016, 7:58 AM Pager:  [phone] After 3pm call: [phone]

## 2016-12-06 NOTE — Progress Notes (Deleted)
  Susan Garrett is a 82 y.o. female brought to ER on 10/08/2016 with dyspnea and hypoxia with altered mental status, and required intubation.  She was noted to also have Rt PTX.  She has PMHx of COPD on chronic 2L O2 (followed by Dr. Melvyn Novas) and  RUL lung CA s/p chemoradiation with chemotherapy in 2016 (followed by Dr. Earlie Server).  She intubated and extubated several times.  She developed an ileus.  She developed recurrent respiratory distress.  Family and pt decided to try Bipap, but not pursue repeat intubation.  She was made DNR.  Her respiratory and mental status got progressively worse.  She was transitioned to comfort measures.  She expired on November 27, 2016.  Final diagnoses: Acute on chronic hypoxic/hypercapnic respiratory failure COPD exacerbation HCAP Spontaneous Right pneumothorax Ileus Hx of AAA A fib with RVR Hx of HTN Hx of hyperthyroidism Steroid induced hyperglycemia. Acute metabolic encephalopathy Deconditioning Hx of lung cancer Leukocytosis   Chesley Mires, MD [place] 11/23/2016, 1:17 PM

## 2016-12-06 NOTE — Progress Notes (Signed)
Pt going in and out of what appears to be A-flutter in the 120s-130s between 06:12-06:22. Maintained good BP throughout. Back in NSR before EKG could be obtained. Given scheduled 2.5mg  Lopressor. Pt still on IV amiodarone at 30mg /hr.  Will continue to monitor.

## 2016-12-06 NOTE — Progress Notes (Signed)
Came to room and noted sat 85% on 40% fio2.  Increased fio2 to 50% w/ no improvement.  Dr Halford Chessman in room now and increased fio2 to 100% and is now contacting family.

## 2016-12-06 NOTE — Progress Notes (Signed)
Responded to [facility] to provide prayer and spiritual support.  Patient is resting.  I prayed over her while she slept. Will follow as needed.

## 2016-12-06 NOTE — Progress Notes (Signed)
Came to room to check on pt- per RN pt is off bipap now for comfort care & family at bedside.

## 2016-12-06 NOTE — Progress Notes (Signed)
Patient is DNR/DNI base on declining respiratory status and desire for more comfort care.  We will sign off.  Hernia is not the problem with overall stability.  Kathryne Eriksson. Dahlia Bailiff, MD, [place] [phone] [facility] Surgery

## 2016-12-06 NOTE — Discharge Summary (Signed)
  Susan Garrett is a 82 y.o. female brought to ER on 10/10/2016 with dyspnea and hypoxia with altered mental status, and required intubation.  She was noted to also have Rt PTX.  She has PMHx of COPD on chronic 2L O2 (followed by Dr. Melvyn Novas) and  RUL lung CA s/p chemoradiation with chemotherapy in 2016 (followed by Dr. Earlie Server).  She intubated and extubated several times.  She developed an ileus.  She developed recurrent respiratory distress.  Family and pt decided to try Bipap, but not pursue repeat intubation.  She was made DNR.  Her respiratory and mental status got progressively worse.  She was transitioned to comfort measures.  She expired on 11/29/16.  Final diagnoses: Acute on chronic hypoxic/hypercapnic respiratory failure COPD exacerbation HCAP Spontaneous Right pneumothorax Ileus Hx of AAA A fib with RVR Hx of HTN Hx of hyperthyroidism Steroid induced hyperglycemia. Acute metabolic encephalopathy Deconditioning Hx of lung cancer Leukocytosis   Chesley Mires, MD [place] 11/23/2016, 1:18 PM

## 2016-12-06 DEATH — deceased

## 2017-01-16 IMAGING — CT CT CHEST W/ CM
2 of 5 series · 16 of 46 positions shown, 18 images · IV contrast (OMNIPAQUE)
Comparison: None

CLINICAL DATA: Shortness of breath, 40 pound weight loss in 1 year,
hypertension, former smoker, past history appendectomy and
hysterectomy

EXAM:
CT CHEST, ABDOMEN, AND PELVIS WITH CONTRAST
TECHNIQUE: Multidetector CT imaging of the chest, abdomen and pelvis was
performed following the standard protocol during bolus
administration of intravenous contrast. Sagittal and coronal MPR
images reconstructed from axial data set.
CONTRAST:  80mL OMNIPAQUE IOHEXOL 300 MG/ML SOLN IV. Dilute oral
contrast.

[Series 2: cap with st · axial · 0.61mm/px · z∈[-547,-27]mm · 13 of 118 slices shown, 15 images]
[im 7/118  soft-tissue]
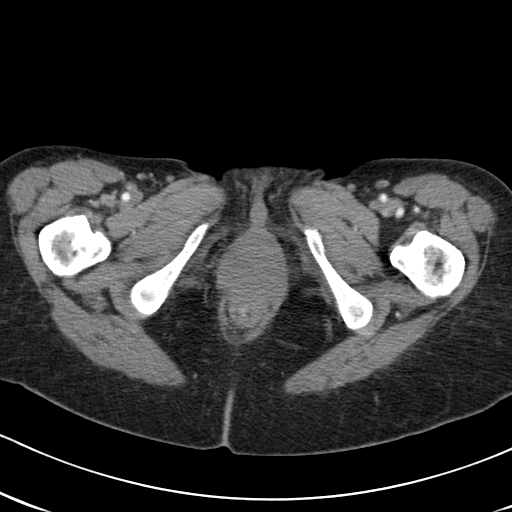
[im 7/118  bone]
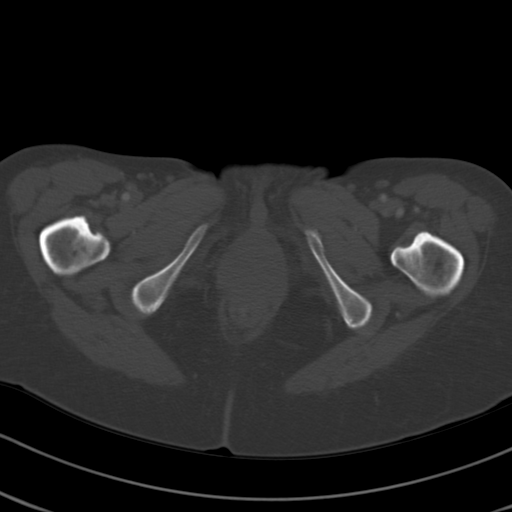
[im 14/118  soft-tissue]
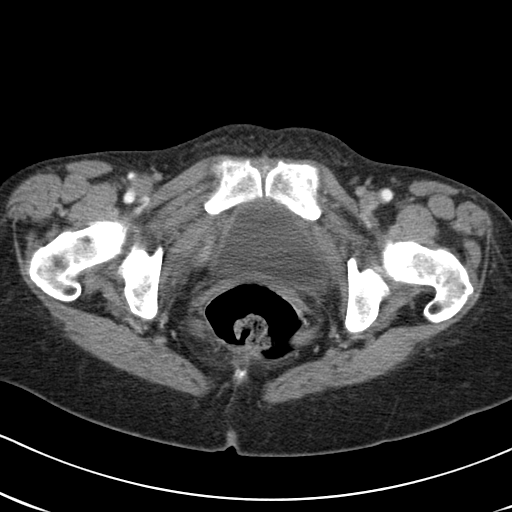
[im 28/118  soft-tissue]
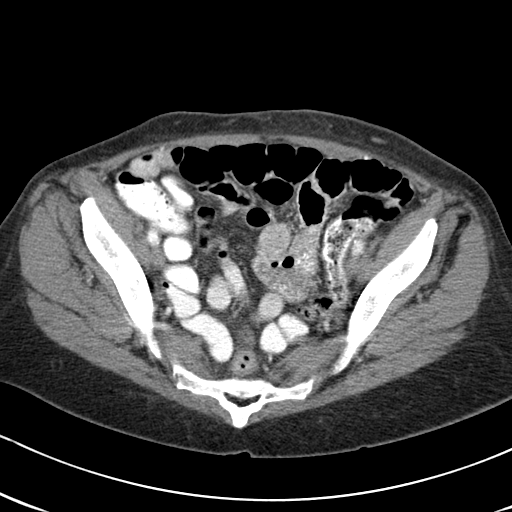
[im 35/118  soft-tissue]
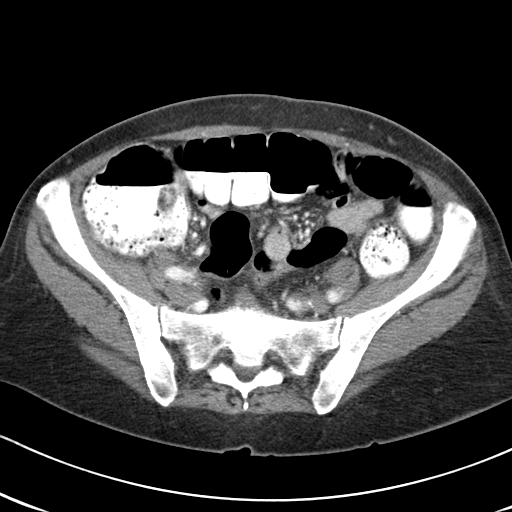
[im 42/118  soft-tissue]
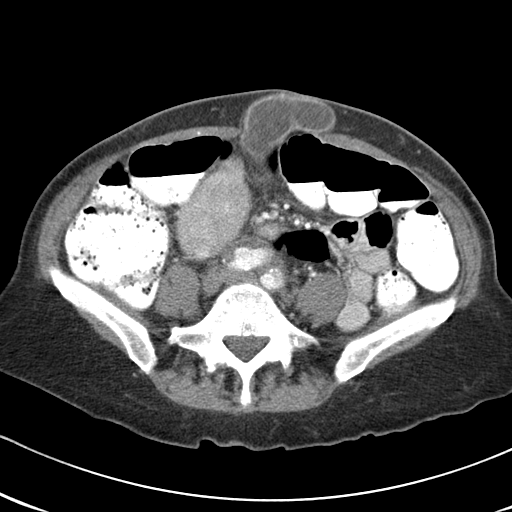
[im 49/118  soft-tissue]
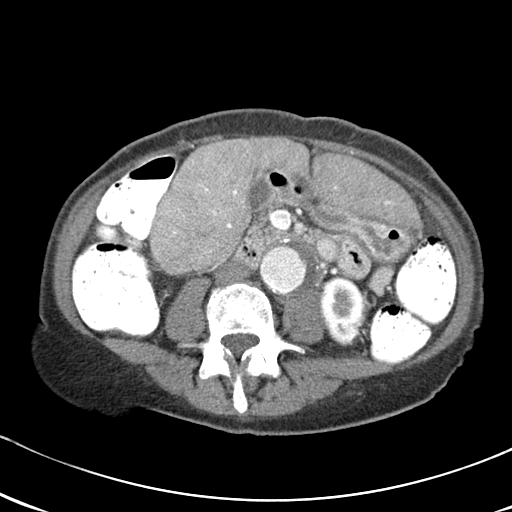
[im 62/118  soft-tissue]
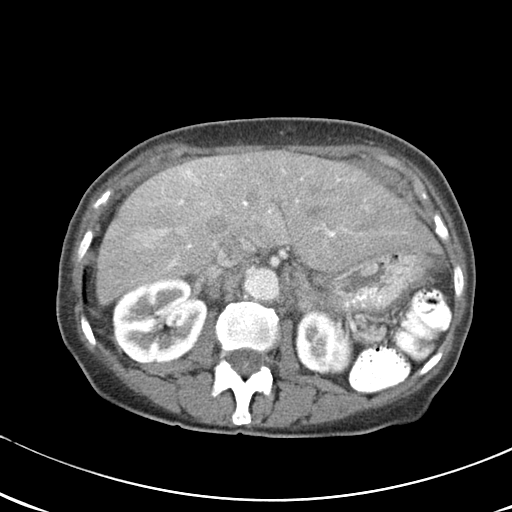
[im 69/118  soft-tissue]
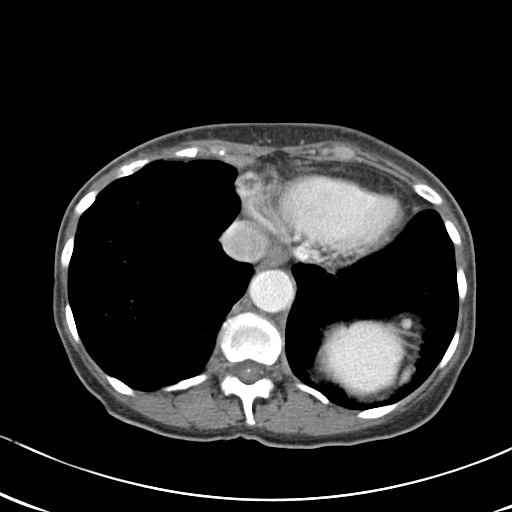
[im 76/118  soft-tissue]
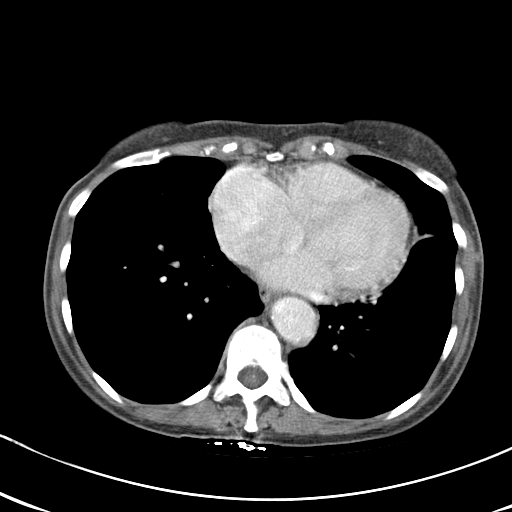
[im 76/118  bone]
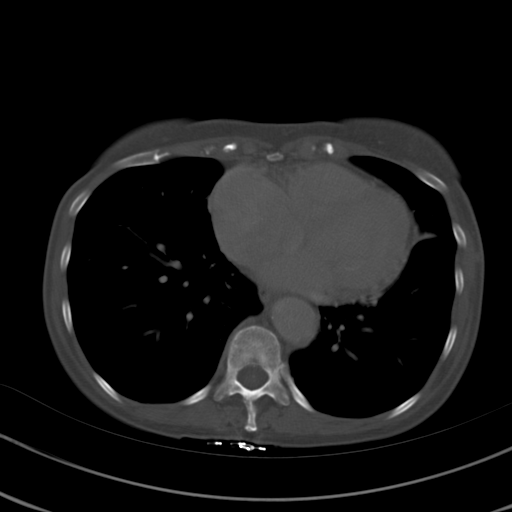
[im 83/118  soft-tissue]
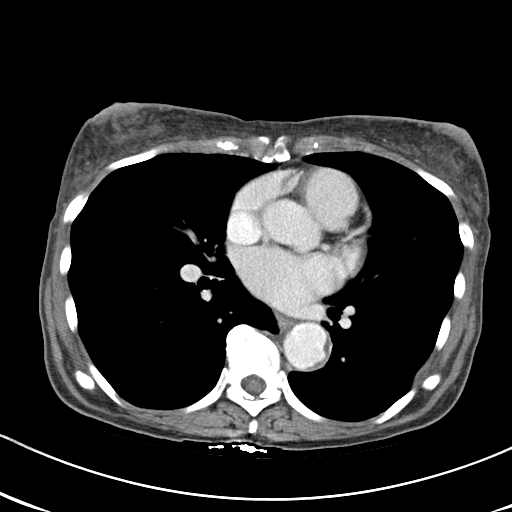
[im 90/118  soft-tissue]
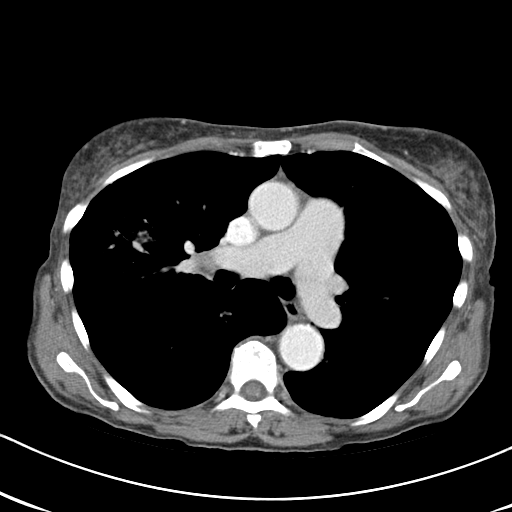
[im 104/118  soft-tissue]
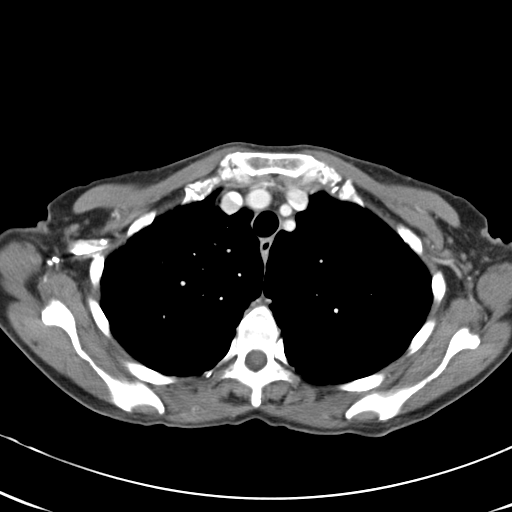
[im 111/118  soft-tissue]
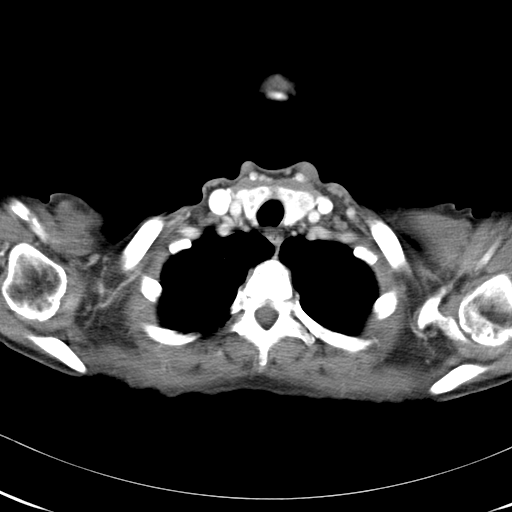

[Series 602: <mpr thick range> · coronal · 1.15mm/px · 3 of 81 slices shown]
[im 27/81  soft-tissue]
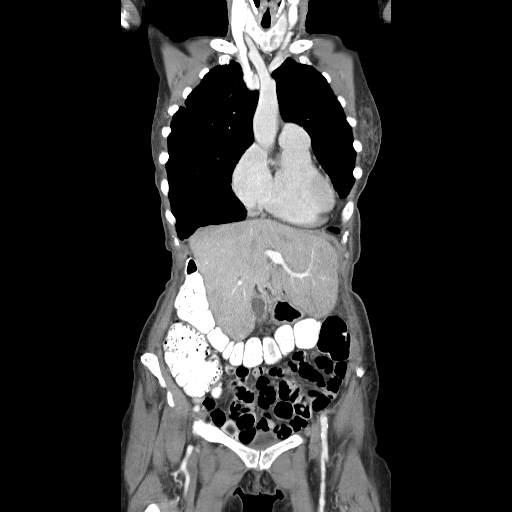
[im 36/81  soft-tissue]
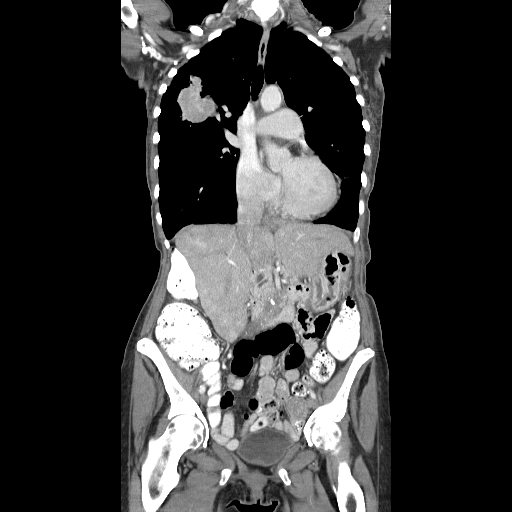
[im 45/81  soft-tissue]
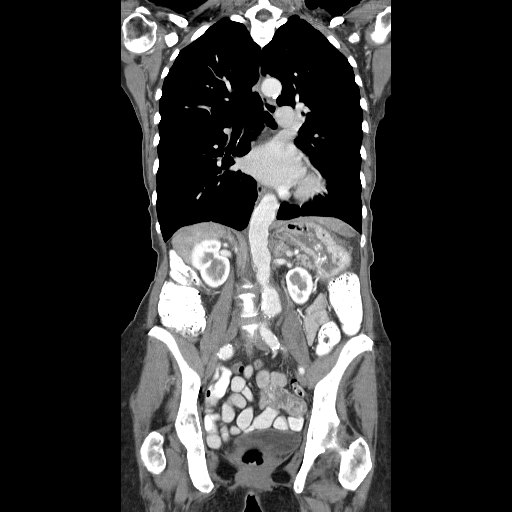

[16 of 46 positions shown; findings below may reference images not displayed]

FINDINGS: CT CHEST FINDINGS

Scattered atherosclerotic calcifications.

No thoracic adenopathy.

Macro lobulated mass with scattered spiculated margins in RIGHT
upper lobe 5.8 x 4.4 x 3.6 cm in size compatible with a primary
pulmonary malignancy.

Underlying emphysematous changes.

Tiny nodule LEFT lower lobe 3 mm diameter image 40, questionably
calcified.

Remaining lungs clear.

No additional mass, nodule, infiltrate, pleural effusion, or
pneumothorax.

No definite osseous metastatic lesions.

CT ABDOMEN AND PELVIS FINDINGS

Multiple hypervascular foci within liver demonstrating washout on
delayed images.

Remainder of liver, spleen, pancreas, kidneys, and adrenal glands
normal.

Abdominal aortic aneurysm 3.3 x 3.6 cm image 72 extending to aortic
bifurcation with dilatation of the RIGHT common iliac artery 2.3 cm
diameter and LEFT common iliac artery 1.6 cm diameter.

Gallbladder extends into a supraumbilical ventral hernia, suspect
containing tiny calculi.

Gallbladder wall is upper normal in thickness.

Unremarkable bladder and ureters.

Uterus surgically absent with nonvisualization of ovaries.

Interposition of colon between liver and lateral RIGHT abdominal
wall.

Stomach and bowel loops otherwise unremarkable.

No additional mass, adenopathy, free fluid or free air.

Bones demineralized.
IMPRESSION: Macro lobulated 5.8 x 4.4 x 3.6 cm diameter RIGHT upper lobe mass
with scattered spiculated margins consistent with a primary
pulmonary malignancy.

No thoracic adenopathy.

Multiple areas of arterial phase enhancement within the liver, seen
on early images with washout on delayed images ; these are
nonspecific and could be seen with atypical hemangiomata, transient
hepatic attenuation differences, hypervascular metastases fell less
likely due to appearance but recommend correlation with MR imaging
with and without contrast to definitively exclude hepatic
metastases.

Emphysematous changes with tiny LEFT lower lobe nodule 3 mm
diameter, questionably calcified.

Abdominal aortic aneurysm 3.3 x 3.6 cm extending into the common
iliac arteries bilaterally.

Small supraumbilical ventral hernia containing the gallbladder,
potentially containing tiny gallstones.

## 2017-01-23 IMAGING — DX DG CHEST 1V PORT
1 series · 1 of 1 positions shown · non-contrast
Comparison: Chest CT 10/11/2014

CLINICAL DATA: Status post bronchoscopy with biopsy. Rule out
pneumothorax.

EXAM:
PORTABLE CHEST - 1 VIEW

[chest ap]
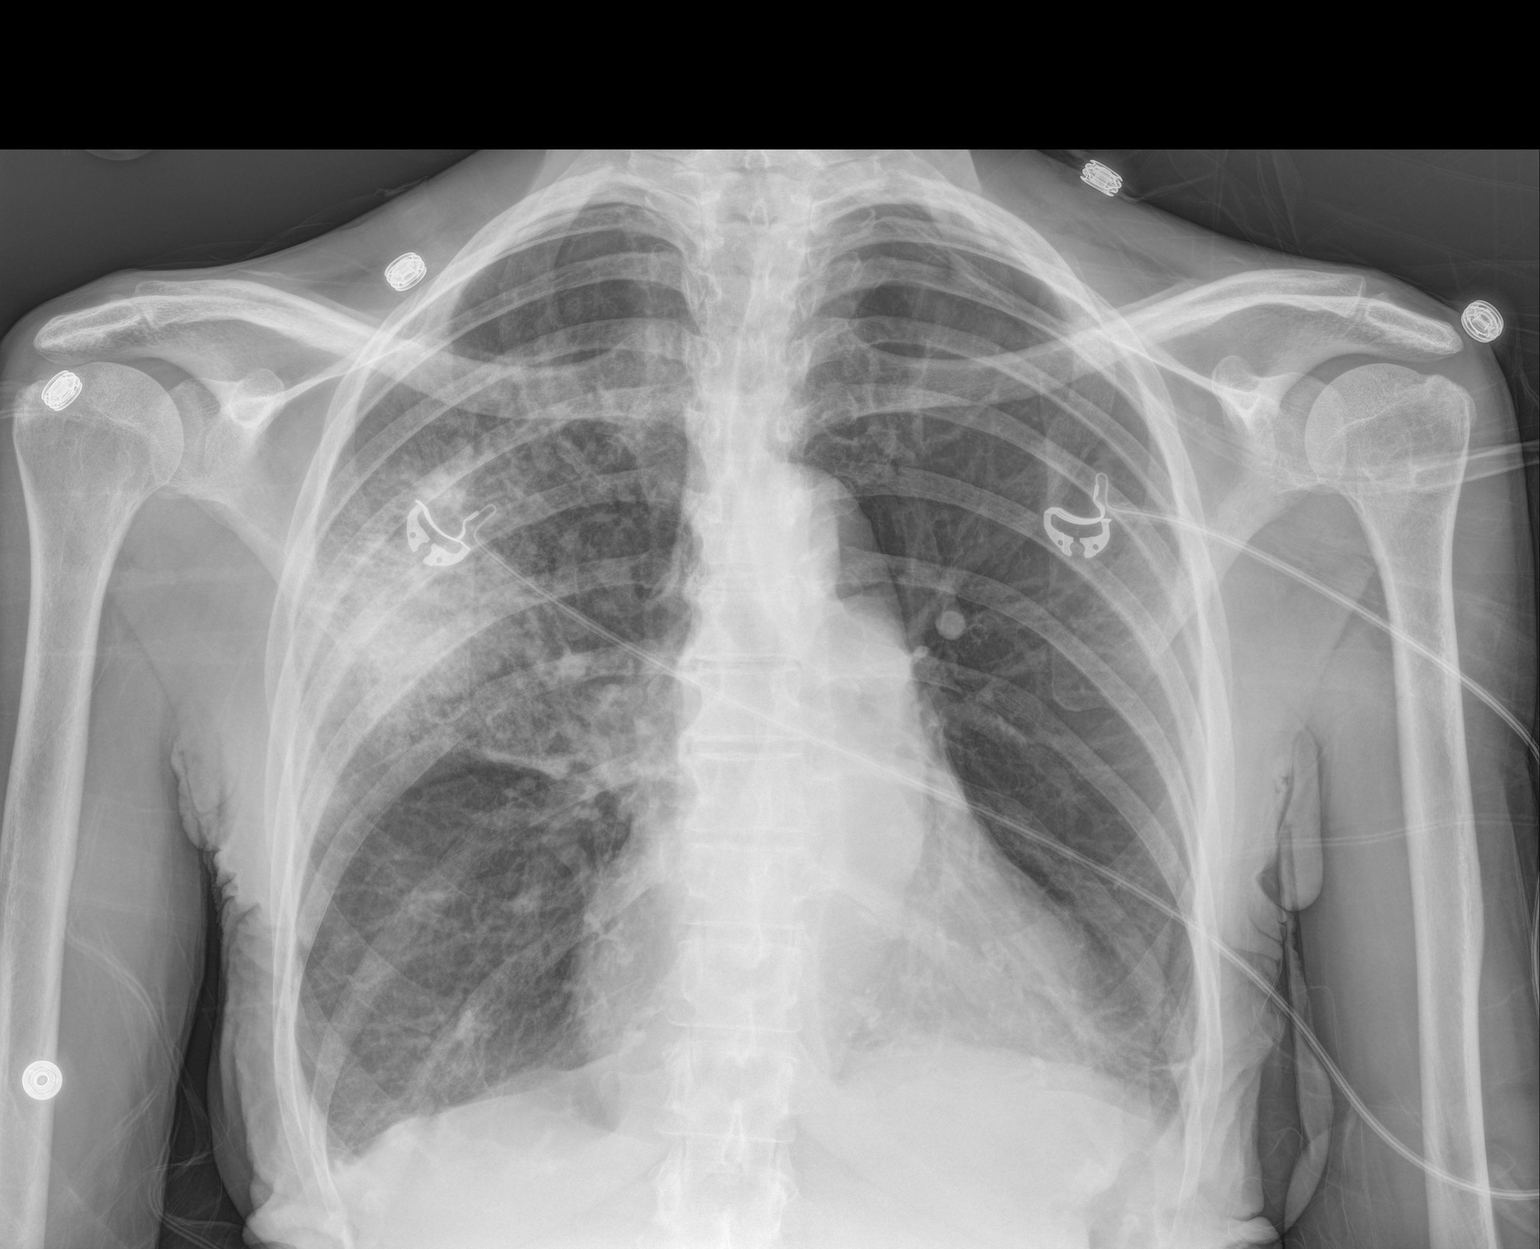

[1 of 1 positions shown; findings below may reference images not displayed]

FINDINGS: The heart is enlarged. Hyperinflated lungs. Bibasilar atelectasis.
Again noted is right upper lobe mass as seen on previous CT exam. No
pneumothorax identified following bronchoscopy. No pulmonary edema.
IMPRESSION: 1. No pneumothorax following bronchoscopy.
2. Persistent right upper lobe mass.
3. Hyperinflation.
